# Patient Record
Sex: Female | Born: 1995 | Race: Black or African American | Hispanic: No | Marital: Single | Smoking: Never smoker
Health system: Southern US, Community
[De-identification: ages and names within clinical notes are randomized; demographics above are authoritative.]

## PROBLEM LIST (undated history)

## (undated) ENCOUNTER — Inpatient Hospital Stay (HOSPITAL_COMMUNITY): Payer: Self-pay

## (undated) DIAGNOSIS — J029 Acute pharyngitis, unspecified: Secondary | ICD-10-CM

## (undated) DIAGNOSIS — D649 Anemia, unspecified: Secondary | ICD-10-CM

## (undated) DIAGNOSIS — R51 Headache: Secondary | ICD-10-CM

## (undated) DIAGNOSIS — O24419 Gestational diabetes mellitus in pregnancy, unspecified control: Secondary | ICD-10-CM

## (undated) DIAGNOSIS — N760 Acute vaginitis: Secondary | ICD-10-CM

## (undated) DIAGNOSIS — R519 Headache, unspecified: Secondary | ICD-10-CM

## (undated) DIAGNOSIS — B3731 Acute candidiasis of vulva and vagina: Secondary | ICD-10-CM

## (undated) DIAGNOSIS — B373 Candidiasis of vulva and vagina: Secondary | ICD-10-CM

## (undated) DIAGNOSIS — A549 Gonococcal infection, unspecified: Secondary | ICD-10-CM

## (undated) DIAGNOSIS — B9689 Other specified bacterial agents as the cause of diseases classified elsewhere: Secondary | ICD-10-CM

## (undated) HISTORY — DX: Gestational diabetes mellitus in pregnancy, unspecified control: O24.419

---
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
## 2014-03-29 ENCOUNTER — Encounter (HOSPITAL_COMMUNITY): Payer: Self-pay | Admitting: Emergency Medicine

## 2014-07-27 ENCOUNTER — Emergency Department (HOSPITAL_COMMUNITY)
Admission: EM | Admit: 2014-07-27 | Discharge: 2014-07-27 | Disposition: A | Discharge status: Home / Self Care | Payer: Self-pay | Source: Home / Self Care

## 2014-07-29 ENCOUNTER — Emergency Department (HOSPITAL_COMMUNITY)
Admission: EM | Admit: 2014-07-29 | Discharge: 2014-07-29 | Disposition: A | Discharge status: Home / Self Care | Payer: Medicaid Other | Attending: Emergency Medicine | Admitting: Emergency Medicine

## 2014-07-29 ENCOUNTER — Encounter (HOSPITAL_COMMUNITY): Payer: Self-pay

## 2014-07-29 DIAGNOSIS — Z8709 Personal history of other diseases of the respiratory system: Secondary | ICD-10-CM | POA: Insufficient documentation

## 2014-07-29 DIAGNOSIS — W231XXA Caught, crushed, jammed, or pinched between stationary objects, initial encounter: Secondary | ICD-10-CM | POA: Insufficient documentation

## 2014-07-29 DIAGNOSIS — S61302A Unspecified open wound of right middle finger with damage to nail, initial encounter: Secondary | ICD-10-CM | POA: Insufficient documentation

## 2014-07-29 DIAGNOSIS — Z8619 Personal history of other infectious and parasitic diseases: Secondary | ICD-10-CM | POA: Insufficient documentation

## 2014-07-29 DIAGNOSIS — Z8742 Personal history of other diseases of the female genital tract: Secondary | ICD-10-CM | POA: Insufficient documentation

## 2014-07-29 DIAGNOSIS — S6981XA Other specified injuries of right wrist, hand and finger(s), initial encounter: Secondary | ICD-10-CM

## 2014-07-29 DIAGNOSIS — Y998 Other external cause status: Secondary | ICD-10-CM | POA: Insufficient documentation

## 2014-07-29 DIAGNOSIS — Y9289 Other specified places as the place of occurrence of the external cause: Secondary | ICD-10-CM | POA: Insufficient documentation

## 2014-07-29 DIAGNOSIS — Y9389 Activity, other specified: Secondary | ICD-10-CM | POA: Insufficient documentation

## 2014-07-29 NOTE — ED Notes (Signed)
Pt's third finger on right hand has fingernail pushed up and off. Still has false nail stuck to it. States it doesn't hurt but it sort of numb.

## 2014-07-29 NOTE — ED Notes (Signed)
Declined W/C at D/C and was escorted to lobby by RN. 

## 2014-07-29 NOTE — Discharge Instructions (Signed)
°  Nail Avulsion Injury °Nail avulsion means that you have lost the whole, or part of a nail. The nail will usually grow back in 2 to 6 months. If your injury damaged the growth center of the nail, the nail may be deformed, split, or not stuck to the nail bed. Sometimes the avulsed nail is stitched back in place. This provides temporary protection to the nail bed until the new nail grows in.  °HOME CARE INSTRUCTIONS  °· Raise (elevate) your injury as much as possible. °· Protect the injury and cover it with bandages (dressings) or splints as instructed. °· Change dressings as instructed. °SEEK MEDICAL CARE IF:  °· There is increasing pain, redness, or swelling. °· You cannot move your fingers or toes. °Document Released: 06/21/2004 Document Revised: 08/06/2011 Document Reviewed: 04/15/2009 °[employer]® Patient Information ©2015 [employer]. This information is not intended to replace advice given to you by your health care provider. Make sure you discuss any questions you have with your health care provider. ° ° °

## 2014-07-29 NOTE — ED Provider Notes (Signed)
CSN: [id number]     Arrival date & time 07/29/14  1422 History  This chart was scribed for Teressa Lower, NP with Benny Lennert, MD by Tonye Royalty, ED Scribe. This patient was seen in room TR06C/TR06C and the patient's care was started at 2:40 PM.    Chief Complaint  Patient presents with  . Finger Injury   Hand Pain  The incident occurred 5 to 7 days ago. The injury mechanism was a direct blow. The pain is present in the right fingers. The pain does not radiate. The pain is mild. The pain has been constant since the incident. Pertinent negatives include no numbness. Exacerbated by: fake nail. She has tried nothing for the symptoms.    HPI Comments: Lori Baldwin is a 19 y.o. female who presents to the Emergency Department complaining of right middle nail injury with onset 1 week ago, worsening yesterday. She has fake nails and states it started coming off 1 week ago and she pulled it off, removing with it part of her nail. She states she then jammed her finger yesterday, making it worse. She states she went to an urgent care yesterday but the wait was too long. She states her last tetanus shot was 2 years ago.  Past Medical History  Diagnosis Date  . Pharyngitis   . Gonorrhea   . BV (bacterial vaginosis)   . Yeast vaginitis   . No pertinent past medical history    Past Surgical History  Procedure Laterality Date  . No past surgeries     Family History  Problem Relation Age of Onset  . Cancer Maternal Grandmother   . Sickle cell anemia Paternal Grandmother    History  Substance Use Topics  . Smoking status: Never Smoker   . Smokeless tobacco: Not on file  . Alcohol Use: No   OB History    Gravida Para Term Preterm AB TAB SAB Ectopic Multiple Living   1              Review of Systems  Skin: Positive for wound.  Neurological: Negative for numbness.  All other systems reviewed and are negative.     Allergies  Aspirin; Ibuprofen; and Tylenol  Home Medications    Prior to Admission medications   Medication Sig Start Date End Date Taking? Authorizing Provider  traMADol (ULTRAM) 50 MG tablet Take 1 tablet (50 mg total) by mouth every 6 (six) hours as needed for pain. 08/08/12   Tatyana A Kirichenko, PA-C   BP 133/102 mmHg  Pulse 84  Temp(Src) 97.8 F (36.6 C) (Oral)  Resp 18  Ht 5' (1.524 m)  Wt 100 lb (45.36 kg)  BMI 19.53 kg/m2  SpO2 100%  LMP 07/29/2014 Physical Exam  Constitutional: She is oriented to person, place, and time. She appears well-developed and well-nourished.  HENT:  Head: Normocephalic and atraumatic.  Eyes: Conjunctivae are normal.  Neck: Normal range of motion. Neck supple.  Pulmonary/Chest: Effort normal.  Musculoskeletal: Normal range of motion.  Neurological: She is alert and oriented to person, place, and time.  Skin: Skin is warm and dry.  Pt has nail and fake nail hanging. Dried blood noted around the area. Top part of the nails is removed completely:mild nail appears the top layer of the nail removed. No definite laceration noted  Psychiatric: She has a normal mood and affect.  Nursing note and vitals reviewed.   ED Course  Procedures (including critical care time)  DIAGNOSTIC STUDIES: Oxygen Saturation  is 100% on room air, normal by my interpretation.    COORDINATION OF CARE: 2:43 PM Discussed treatment plan with patient at beside, the patient agrees with the plan and has no further questions at this time.   Labs Review Labs Reviewed - No data to display  Imaging Review No results found.   EKG Interpretation None      MDM   Final diagnoses:  Nailbed injury, right, initial encounter    Nail cut off and area was dressed. Discussed return precautions and follow up  I personally performed the services described in this documentation, which was scribed in my presence. The recorded information has been reviewed and is accurate.   Teressa LowerVrinda Alexes Lamarque, NP 07/29/14 1514  Benny LennertJoseph L Zammit,  MD 07/30/14 [phone]

## 2016-05-16 ENCOUNTER — Encounter (HOSPITAL_COMMUNITY): Payer: Self-pay | Admitting: Emergency Medicine

## 2016-05-16 ENCOUNTER — Ambulatory Visit (HOSPITAL_COMMUNITY)
Admission: EM | Admit: 2016-05-16 | Discharge: 2016-05-16 | Disposition: A | Discharge status: Home / Self Care | Payer: Medicaid Other | Attending: Emergency Medicine | Admitting: Emergency Medicine

## 2016-05-16 DIAGNOSIS — L298 Other pruritus: Secondary | ICD-10-CM | POA: Diagnosis not present

## 2016-05-16 DIAGNOSIS — Z832 Family history of diseases of the blood and blood-forming organs and certain disorders involving the immune mechanism: Secondary | ICD-10-CM | POA: Insufficient documentation

## 2016-05-16 DIAGNOSIS — Z809 Family history of malignant neoplasm, unspecified: Secondary | ICD-10-CM | POA: Insufficient documentation

## 2016-05-16 DIAGNOSIS — Z79899 Other long term (current) drug therapy: Secondary | ICD-10-CM | POA: Insufficient documentation

## 2016-05-16 DIAGNOSIS — L292 Pruritus vulvae: Secondary | ICD-10-CM | POA: Insufficient documentation

## 2016-05-16 DIAGNOSIS — N898 Other specified noninflammatory disorders of vagina: Secondary | ICD-10-CM

## 2016-05-16 LAB — POCT URINALYSIS DIP (DEVICE)
Bilirubin Urine: NEGATIVE
GLUCOSE, UA: NEGATIVE mg/dL
KETONES UR: NEGATIVE mg/dL
Nitrite: NEGATIVE
PROTEIN: 30 mg/dL — AB
UROBILINOGEN UA: 0.2 mg/dL (ref 0.0–1.0)
pH: 6 (ref 5.0–8.0)

## 2016-05-16 LAB — POCT PREGNANCY, URINE: PREG TEST UR: NEGATIVE

## 2016-05-16 MED ORDER — FLUCONAZOLE 150 MG PO TABS: 150.0000 mg | ORAL_TABLET | Freq: Once | ORAL | 0 refills | Status: AC

## 2016-05-16 NOTE — ED Triage Notes (Signed)
Vaginal irritation that started on Friday 12/15.  Denies urinary symptoms.  Denies abdominal pain or back pain

## 2016-05-16 NOTE — ED Provider Notes (Signed)
[facility]    CSN: [id number] Arrival date & time: 05/16/16  1118     History   Chief Complaint Chief Complaint  Patient presents with  . Vaginal Itching    HPI Lori Baldwin is a 20 y.o. female.   HPI  She is a 20 year old woman here for evaluation of vaginal itching. This is been going on for a few days. She denies any discharge. She states she just finished her period. She has tried patches still without improvement. No abdominal pain. No new sexual partners.  Past Medical History:  Diagnosis Date  . BV (bacterial vaginosis)   . Gonorrhea   . No pertinent past medical history   . Pharyngitis   . Yeast vaginitis     There are no active problems to display for this patient.   Past Surgical History:  Procedure Laterality Date  . NO PAST SURGERIES      OB History    Gravida Para Term Preterm AB Living   1             SAB TAB Ectopic Multiple Live Births                   Home Medications    Prior to Admission medications   Medication Sig Start Date End Date Taking? Authorizing Provider  benzocaine-resorcinol (VAGISIL) 5-2 % vaginal cream Place vaginally at bedtime.   Yes Historical Provider, MD  fluconazole (DIFLUCAN) 150 MG tablet Take 1 tablet (150 mg total) by mouth once. Take 2nd pill if symptoms not completely resolved in 3 days. 05/16/16 05/16/16  Charm RingsErin J Shayle Donahoo, MD  traMADol (ULTRAM) 50 MG tablet Take 1 tablet (50 mg total) by mouth every 6 (six) hours as needed for pain. 08/08/12   Jaynie Crumbleatyana Kirichenko, PA-C    Family History Family History  Problem Relation Age of Onset  . Cancer Maternal Grandmother   . Sickle cell anemia Paternal Grandmother     Social History Social History  Substance Use Topics  . Smoking status: Never Smoker  . Smokeless tobacco: Not on file  . Alcohol use No     Allergies   Aspirin; Ibuprofen; and Tylenol [acetaminophen]   Review of Systems Review of Systems As in history of present  illness  Physical Exam Triage Vital Signs ED Triage Vitals [05/16/16 1152]  Enc Vitals Group     BP (!) 92/53     Pulse Rate 81     Resp 16     Temp 98.2 F (36.8 C)     Temp Source Oral     SpO2 100 %     Weight      Height      Head Circumference      Peak Flow      Pain Score      Pain Loc      Pain Edu?      Excl. in GC?    No data found.   Updated Vital Signs BP (!) 92/53 (BP Location: Right Arm)   Pulse 81   Temp 98.2 F (36.8 C) (Oral)   Resp 16   LMP 05/06/2016   SpO2 100%   Visual Acuity Right Eye Distance:   Left Eye Distance:   Bilateral Distance:    Right Eye Near:   Left Eye Near:    Bilateral Near:     Physical Exam  Constitutional: She is oriented to person, place, and time. She appears well-developed and well-nourished. No  distress.  Cardiovascular: Normal rate.   Pulmonary/Chest: Effort normal.  Genitourinary: There is no rash on the right labia. There is no rash on the left labia. Cervix exhibits no motion tenderness and no discharge. No bleeding in the vagina. No foreign body in the vagina. Vaginal discharge (old blood) found.  Genitourinary Comments: Slight irritation of labia minora.  Neurological: She is alert and oriented to person, place, and time.     UC Treatments / Results  Labs (all labs ordered are listed, but only abnormal results are displayed) Labs Reviewed  POCT URINALYSIS DIP (DEVICE) - Abnormal; Notable for the following:       Result Value   Hgb urine dipstick LARGE (*)    Protein, ur 30 (*)    Leukocytes, UA TRACE (*)    All other components within normal limits  POCT PREGNANCY, URINE  CERVICOVAGINAL ANCILLARY ONLY    EKG  EKG Interpretation None       Radiology No results found.  Procedures Procedures (including critical care time)  Medications Ordered in UC Medications - No data to display   Initial Impression / Assessment and Plan / UC Course  I have reviewed the triage vital signs and the  nursing notes.  Pertinent labs & imaging results that were available during my care of the patient were reviewed by me and considered in my medical decision making (see chart for details).  Clinical Course     Treat for yeast with Diflucan. Swab sent for testing. Follow-up as needed.  Final Clinical Impressions(s) / UC Diagnoses   Final diagnoses:  Vaginal itching    New Prescriptions New Prescriptions   FLUCONAZOLE (DIFLUCAN) 150 MG TABLET    Take 1 tablet (150 mg total) by mouth once. Take 2nd pill if symptoms not completely resolved in 3 days.     Charm RingsErin J Jennifer Payes, MD 05/16/16 [phone]

## 2016-05-16 NOTE — Discharge Instructions (Signed)
It looks like a yeast infection. Take Diflucan one pill today. If still having itching in 3 days, take the second pill. The results of the testing should be back in 2-3 days. We will call you if anything is positive. Follow-up as needed.

## 2016-05-17 LAB — CERVICOVAGINAL ANCILLARY ONLY
CHLAMYDIA, DNA PROBE: NEGATIVE
NEISSERIA GONORRHEA: NEGATIVE

## 2016-05-18 LAB — CERVICOVAGINAL ANCILLARY ONLY: Wet Prep (BD Affirm): POSITIVE — AB

## 2016-06-29 ENCOUNTER — Emergency Department (HOSPITAL_COMMUNITY)
Admission: EM | Admit: 2016-06-29 | Discharge: 2016-06-29 | Disposition: A | Discharge status: Home / Self Care | Payer: Medicaid Other | Attending: Dermatology | Admitting: Dermatology

## 2016-06-29 ENCOUNTER — Encounter (HOSPITAL_COMMUNITY): Payer: Self-pay | Admitting: *Deleted

## 2016-06-29 DIAGNOSIS — Z5321 Procedure and treatment not carried out due to patient leaving prior to being seen by health care provider: Secondary | ICD-10-CM | POA: Diagnosis not present

## 2016-06-29 DIAGNOSIS — R0789 Other chest pain: Secondary | ICD-10-CM | POA: Diagnosis present
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
## 2016-07-23 ENCOUNTER — Encounter (HOSPITAL_COMMUNITY): Payer: Self-pay

## 2016-07-23 ENCOUNTER — Emergency Department (HOSPITAL_COMMUNITY)
Admission: EM | Admit: 2016-07-23 | Discharge: 2016-07-23 | Disposition: A | Discharge status: Home / Self Care | Payer: Medicaid Other | Attending: Emergency Medicine | Admitting: Emergency Medicine

## 2016-07-23 DIAGNOSIS — H6502 Acute serous otitis media, left ear: Secondary | ICD-10-CM | POA: Diagnosis not present

## 2016-07-23 DIAGNOSIS — J029 Acute pharyngitis, unspecified: Secondary | ICD-10-CM | POA: Diagnosis present

## 2016-07-23 DIAGNOSIS — R05 Cough: Secondary | ICD-10-CM | POA: Diagnosis not present

## 2016-07-23 DIAGNOSIS — R059 Cough, unspecified: Secondary | ICD-10-CM

## 2016-07-23 LAB — RAPID STREP SCREEN (MED CTR MEBANE ONLY): Streptococcus, Group A Screen (Direct): NEGATIVE

## 2016-07-23 MED ORDER — PREDNISONE 10 MG PO TABS: 20.0000 mg | ORAL_TABLET | Freq: Two times a day (BID) | ORAL | 0 refills | Status: DC

## 2016-07-23 MED ORDER — AMOXICILLIN 500 MG PO CAPS: 500.0000 mg | ORAL_CAPSULE | Freq: Three times a day (TID) | ORAL | 0 refills | Status: DC

## 2016-07-23 MED ORDER — BENZONATATE 100 MG PO CAPS: 100.0000 mg | ORAL_CAPSULE | Freq: Three times a day (TID) | ORAL | 0 refills | Status: DC

## 2016-07-23 NOTE — Discharge Instructions (Signed)
You will need to have your ear rechecked in the next week or so to be sure the infection is resolving. Return here sooner for worsening symptoms.

## 2016-07-23 NOTE — ED Triage Notes (Signed)
Pt reports sore throat, sinus congestion, generalized weakness, cough and left ear pain; onset last week. Denies vomiting/diarrhea. OTC meds not helping.

## 2016-07-23 NOTE — ED Provider Notes (Signed)
[facility] Note   CSN: [id number] Arrival date & time: 07/23/16  1912  By signing my name below, I, Lori Baldwin, attest that this documentation has been prepared under the direction and in the presence of non-physician practitioner, Kerrie Buffalo, NP. Electronically Signed: Modena Baldwin, Scribe. 07/23/2016. 8:55 PM.  History   Chief Complaint Chief Complaint  Patient presents with  . Sore Throat  . Croup   The history is provided by the patient. No language interpreter was used.  Sore Throat  This is a new problem. The current episode started more than 2 days ago. The problem occurs constantly. The problem has been gradually worsening. Pertinent negatives include no headaches. Nothing aggravates the symptoms. Nothing relieves the symptoms. She has tried acetaminophen for the symptoms. The treatment provided no relief.  Cough  This is a new problem. The current episode started more than 2 days ago. The problem has been gradually worsening. Associated symptoms include ear pain (Left) and sore throat. Pertinent negatives include no chills and no headaches. The treatment provided no relief.   HPI Comments: Lori Baldwin is a 21 y.o. female who presents to the Emergency Department complaining of intermittent moderate cough that started about a week ago. She states she has been having gradually worsening URI-like symptoms. She took Mucinex, Nyquil, and Acetaminophen, and herbal remedies without relief. She reports associated sore throat, sinus congestion, generalized weakness, left ear pain, and nausea. She denies any fever, chills, vomiting, or other complaints.   Past Medical History:  Diagnosis Date  . BV (bacterial vaginosis)   . Gonorrhea   . No pertinent past medical history   . Pharyngitis   . Yeast vaginitis     There are no active problems to display for this patient.   Past Surgical History:  Procedure Laterality Date  . NO PAST SURGERIES      OB  History    Gravida Para Term Preterm AB Living   1             SAB TAB Ectopic Multiple Live Births                   Home Medications    Prior to Admission medications   Medication Sig Start Date End Date Taking? Authorizing Provider  amoxicillin (AMOXIL) 500 MG capsule Take 1 capsule (500 mg total) by mouth 3 (three) times daily. 07/23/16   Hope Orlene Och, NP  benzocaine-resorcinol (VAGISIL) 5-2 % vaginal cream Place vaginally at bedtime.    Historical Provider, MD  benzonatate (TESSALON) 100 MG capsule Take 1 capsule (100 mg total) by mouth every 8 (eight) hours. 07/23/16   Hope Orlene Och, NP  predniSONE (DELTASONE) 10 MG tablet Take 2 tablets (20 mg total) by mouth 2 (two) times daily with a meal. 07/23/16   Hope Orlene Och, NP  traMADol (ULTRAM) 50 MG tablet Take 1 tablet (50 mg total) by mouth every 6 (six) hours as needed for pain. 08/08/12   Jaynie Crumble, PA-C    Family History Family History  Problem Relation Age of Onset  . Cancer Maternal Grandmother   . Sickle cell anemia Paternal Grandmother     Social History Social History  Substance Use Topics  . Smoking status: Never Smoker  . Smokeless tobacco: Never Used  . Alcohol use No     Allergies   Aspirin; Ibuprofen; and Tylenol [acetaminophen]   Review of Systems Review of Systems  Constitutional: Negative for chills and fever.  HENT: Positive for congestion (Sinus), ear pain (Left) and sore throat.   Respiratory: Positive for cough.   Gastrointestinal: Positive for nausea. Negative for vomiting.  Musculoskeletal: Negative for back pain.  Neurological: Positive for weakness (Generalized). Negative for dizziness and headaches.     Physical Exam Updated Vital Signs BP 104/69 (BP Location: Right Arm)   Pulse 108   Temp 98.3 F (36.8 C) (Oral)   Resp 18   LMP 06/30/2016   SpO2 100%   Physical Exam  Constitutional: She appears well-developed and well-nourished. No distress.  HENT:  Head: Normocephalic.    Right Ear: Tympanic membrane is retracted.  Left Ear: Tympanic membrane is erythematous and bulging.  Mouth/Throat: Uvula is midline. Posterior oropharyngeal erythema present. No posterior oropharyngeal edema.  Right TM is dull and retracted. Left TM is red and bulging.   Eyes: Conjunctivae are normal.  Neck: Neck supple.  Cardiovascular: Normal rate and regular rhythm.   Pulmonary/Chest: Effort normal. No respiratory distress. She has no wheezes. She has no rales.  Abdominal: Soft. There is no tenderness.  Musculoskeletal: Normal range of motion.  Neurological: She is alert.  Skin: Skin is warm and dry.  Psychiatric: She has a normal mood and affect.  Nursing note and vitals reviewed.    ED Treatments / Results  DIAGNOSTIC STUDIES: Oxygen Saturation is 100% on RA, normal by my interpretation.    COORDINATION OF CARE: 8:59 PM- Pt advised of plan for treatment and pt agrees.  Labs (all labs ordered are listed, but only abnormal results are displayed) Labs Reviewed  RAPID STREP SCREEN (NOT AT [facility])  CULTURE, GROUP A STREP [facility]   Radiology No results found.  Procedures Procedures (including critical care time)  Medications Ordered in ED Medications - No data to display   Initial Impression / Assessment and Plan / ED Course  I have reviewed the triage vital signs and the nursing notes.  Pertinent labs & imaging results that were available during my care of the patient were reviewed by me and considered in my medical decision making (see chart for details).   Pt CXR negative for acute infiltrate. Patients symptoms are consistent with URI, and otitis media. Discussed antibiotics indicated for ear infections. Pt will be discharged with symptomatic treatment.  Verbalizes understanding and is agreeable with plan. Pt is hemodynamically stable & in NAD prior to dc.  Final Clinical Impressions(s) / ED Diagnoses   Final diagnoses:  Acute serous otitis media of left ear,  recurrence not specified  Cough    New Prescriptions Discharge Medication List as of 07/23/2016  9:06 PM    START taking these medications   Details  amoxicillin (AMOXIL) 500 MG capsule Take 1 capsule (500 mg total) by mouth 3 (three) times daily., Starting Mon 07/23/2016, Print    benzonatate (TESSALON) 100 MG capsule Take 1 capsule (100 mg total) by mouth every 8 (eight) hours., Starting Mon 07/23/2016, Print    predniSONE (DELTASONE) 10 MG tablet Take 2 tablets (20 mg total) by mouth 2 (two) times daily with a meal., Starting Mon 07/23/2016, Print       I personally performed the services described in this documentation, which was scribed in my presence. The recorded information has been reviewed and is accurate.     [address]Hope FlushingM Neese, [place]NP 07/24/16 2053    Cathren LaineKevin Steinl, MD 07/26/16 1024

## 2016-07-23 NOTE — ED Notes (Signed)
Pt verbalized understanding of d/c instructions and has no further questions. Pt is stable, A&Ox4, VSS.  

## 2016-07-26 LAB — CULTURE, GROUP A STREP (THRC)

## 2016-09-27 ENCOUNTER — Encounter (HOSPITAL_COMMUNITY): Payer: Self-pay | Admitting: Emergency Medicine

## 2016-09-27 ENCOUNTER — Ambulatory Visit (HOSPITAL_COMMUNITY)
Admission: EM | Admit: 2016-09-27 | Discharge: 2016-09-27 | Disposition: A | Discharge status: Home / Self Care | Payer: Medicaid Other | Attending: Nurse Practitioner | Admitting: Nurse Practitioner

## 2016-09-27 DIAGNOSIS — B373 Candidiasis of vulva and vagina: Secondary | ICD-10-CM | POA: Insufficient documentation

## 2016-09-27 DIAGNOSIS — Z711 Person with feared health complaint in whom no diagnosis is made: Secondary | ICD-10-CM

## 2016-09-27 DIAGNOSIS — Z202 Contact with and (suspected) exposure to infections with a predominantly sexual mode of transmission: Secondary | ICD-10-CM

## 2016-09-27 DIAGNOSIS — N760 Acute vaginitis: Secondary | ICD-10-CM | POA: Diagnosis not present

## 2016-09-27 DIAGNOSIS — A549 Gonococcal infection, unspecified: Secondary | ICD-10-CM | POA: Insufficient documentation

## 2016-09-27 DIAGNOSIS — R8299 Other abnormal findings in urine: Secondary | ICD-10-CM

## 2016-09-27 DIAGNOSIS — N898 Other specified noninflammatory disorders of vagina: Secondary | ICD-10-CM

## 2016-09-27 DIAGNOSIS — Z3202 Encounter for pregnancy test, result negative: Secondary | ICD-10-CM

## 2016-09-27 LAB — POCT PREGNANCY, URINE: Preg Test, Ur: NEGATIVE

## 2016-09-27 LAB — POCT URINALYSIS DIP (DEVICE)
Bilirubin Urine: NEGATIVE
Glucose, UA: NEGATIVE mg/dL
HGB URINE DIPSTICK: NEGATIVE
KETONES UR: NEGATIVE mg/dL
LEUKOCYTES UA: NEGATIVE
Nitrite: NEGATIVE
PH: 6.5 (ref 5.0–8.0)
Protein, ur: NEGATIVE mg/dL
SPECIFIC GRAVITY, URINE: 1.025 (ref 1.005–1.030)
Urobilinogen, UA: 1 mg/dL (ref 0.0–1.0)

## 2016-09-27 MED ORDER — CEFTRIAXONE SODIUM 250 MG IJ SOLR
250.0000 mg | Freq: Once | INTRAMUSCULAR | Status: AC
  Administered 2016-09-27: 250 mg via INTRAMUSCULAR

## 2016-09-27 MED ORDER — CEFTRIAXONE SODIUM 250 MG IJ SOLR
INTRAMUSCULAR | Status: AC
  Filled 2016-09-27: qty 250

## 2016-09-27 MED ORDER — AZITHROMYCIN 250 MG PO TABS
1000.0000 mg | ORAL_TABLET | Freq: Once | ORAL | Status: AC
  Administered 2016-09-27: 1000 mg via ORAL

## 2016-09-27 MED ORDER — AZITHROMYCIN 250 MG PO TABS
ORAL_TABLET | ORAL | Status: AC
  Filled 2016-09-27: qty 4

## 2016-09-27 NOTE — ED Provider Notes (Signed)
CSN: [id number]     Arrival date & time 09/27/16  1647 History   First MD Initiated Contact with Patient 09/27/16 1721     Chief Complaint  Patient presents with  . Vaginal Discharge   (Consider location/radiation/quality/duration/timing/severity/associated sxs/prior Treatment) Subjective:  Lori Baldwin is a 21 y.o. female who complains of vaginal discharge, foul smelling urine and suprapubic pressure for 5 days. She denies any dysuria, urinary frequency, abdominal pain, nausea or vomiting. She initailly thought her symptoms was due to a yeast infection and tried a one-day course of Monistat without much relief. Patient denies any fever. Patient is in a heterosexual relationship and reports that her boyfriend has been unfaithful. She does not use any barrier contraception. Patient has a history of bacterial vaginosis and gonorrhea. Patient does not have a history of recurrent UTI.  Patient does not have a history of pyelonephritis.   The following portions of the patient's history were reviewed and updated as appropriate: allergies, current medications, past family history, past medical history, past social history, past surgical history and problem list.          Past Medical History:  Diagnosis Date  . BV (bacterial vaginosis)   . Gonorrhea   . No pertinent past medical history   . Pharyngitis   . Yeast vaginitis    Past Surgical History:  Procedure Laterality Date  . NO PAST SURGERIES     Family History  Problem Relation Age of Onset  . Cancer Maternal Grandmother   . Sickle cell anemia Paternal Grandmother    Social History  Substance Use Topics  . Smoking status: Never Smoker  . Smokeless tobacco: Never Used  . Alcohol use No   OB History    Gravida Para Term Preterm AB Living   1             SAB TAB Ectopic Multiple Live Births                 Review of Systems  Gastrointestinal: Negative for abdominal pain, nausea and vomiting.  Genitourinary: Positive  for vaginal discharge. Negative for dysuria, flank pain and pelvic pain.    Allergies  Aspirin; Ibuprofen; and Tylenol [acetaminophen]  Home Medications   Prior to Admission medications   Medication Sig Start Date End Date Taking? Authorizing Provider  amoxicillin (AMOXIL) 500 MG capsule Take 1 capsule (500 mg total) by mouth 3 (three) times daily. 07/23/16   Hope Orlene OchM Neese, NP  benzocaine-resorcinol (VAGISIL) 5-2 % vaginal cream Place vaginally at bedtime.    Historical Provider, MD  benzonatate (TESSALON) 100 MG capsule Take 1 capsule (100 mg total) by mouth every 8 (eight) hours. 07/23/16   Hope Orlene OchM Neese, NP  predniSONE (DELTASONE) 10 MG tablet Take 2 tablets (20 mg total) by mouth 2 (two) times daily with a meal. 07/23/16   Hope Orlene OchM Neese, NP  traMADol (ULTRAM) 50 MG tablet Take 1 tablet (50 mg total) by mouth every 6 (six) hours as needed for pain. 08/08/12   Jaynie Crumbleatyana Kirichenko, PA-C   Meds Ordered and Administered this Visit  Medications - No data to display  BP 107/67 (BP Location: Left Arm)   Pulse 90   Temp 98.5 F (36.9 C) (Oral)   Resp 14   SpO2 100%   Breastfeeding? No  No data found.   Physical Exam  Constitutional: She is oriented to person, place, and time. She appears well-developed and well-nourished.  Neck: Normal range of motion.  Cardiovascular: Normal  rate and regular rhythm.   Pulmonary/Chest: Effort normal and breath sounds normal.  Abdominal: Soft. Bowel sounds are normal. She exhibits no distension. There is no tenderness.  Genitourinary: Vaginal discharge found.  Genitourinary Comments: Female chaperone present. External genitalia without erythema, exudate or discharge. Vaginal vault with milky discharge. Cervix is of normal color and without any lesions. The cervical os is closed. No bleeding noted. Uterus is noted to be of normal size and nontender. No cervical motion tenderness. No palpable masses. The adnexa are without any massess or tenderness.    Musculoskeletal: Normal range of motion.  Neurological: She is alert and oriented to person, place, and time.  Skin: Skin is warm and dry.  Psychiatric: She has a normal mood and affect.    Urgent Care Course     Procedures (including critical care time)  Labs Review Labs Reviewed  POCT URINALYSIS DIP (DEVICE)  POCT PREGNANCY, URINE  CERVICOVAGINAL ANCILLARY ONLY   Laboratory:  Urine dipstick shows negative for all components.   Urine pregnancy negative  Imaging Review  No results found.   Visual Acuity Review  Right Eye Distance:   Left Eye Distance:   Bilateral Distance:    Right Eye Near:   Left Eye Near:    Bilateral Near:         MDM   1. Vaginal discharge   2. Concern about STD in female without diagnosis    Cervicovaginal screening pending. Will contact patient when results of screening is available. Will prophylactically treat patient for gonorrhea/chlamydia in the office with Azithromycin and Rocephin. Advised patient to have all partner(s) tested/treated and to abstain from unprotected sex for at least 7 days from the time that her partner(s) have been treated. Safe sex practices encouraged.   Discussed diagnosis and treatment with patient. All questions have been answered and all concerns have been addressed. The patient verbalized understanding and had no further questions    Lurline Idol, FNP 09/27/16 1756

## 2016-09-27 NOTE — ED Triage Notes (Signed)
Here for vag d/c onset yest associated w/foul odor  Took OTC Monistat w/some relief  Sexually active and does not use condoms... Concerned for STDS  A&O x4... NAD

## 2016-09-28 LAB — CERVICOVAGINAL ANCILLARY ONLY
Bacterial vaginitis: POSITIVE — AB
CANDIDA VAGINITIS: NEGATIVE
CHLAMYDIA, DNA PROBE: NEGATIVE
Neisseria Gonorrhea: POSITIVE — AB
Trichomonas: NEGATIVE

## 2017-03-11 ENCOUNTER — Encounter (HOSPITAL_COMMUNITY): Payer: Self-pay | Admitting: *Deleted

## 2017-03-11 ENCOUNTER — Ambulatory Visit (HOSPITAL_COMMUNITY)
Admission: EM | Admit: 2017-03-11 | Discharge: 2017-03-11 | Disposition: A | Discharge status: Home / Self Care | Payer: Medicaid Other | Attending: Emergency Medicine | Admitting: Emergency Medicine

## 2017-03-11 DIAGNOSIS — Z202 Contact with and (suspected) exposure to infections with a predominantly sexual mode of transmission: Secondary | ICD-10-CM | POA: Insufficient documentation

## 2017-03-11 DIAGNOSIS — Z113 Encounter for screening for infections with a predominantly sexual mode of transmission: Secondary | ICD-10-CM | POA: Diagnosis not present

## 2017-03-11 DIAGNOSIS — N898 Other specified noninflammatory disorders of vagina: Secondary | ICD-10-CM | POA: Diagnosis not present

## 2017-03-11 DIAGNOSIS — Z3202 Encounter for pregnancy test, result negative: Secondary | ICD-10-CM

## 2017-03-11 LAB — POCT PREGNANCY, URINE: Preg Test, Ur: NEGATIVE

## 2017-03-11 MED ORDER — METRONIDAZOLE 500 MG PO TABS: 500.0000 mg | ORAL_TABLET | Freq: Two times a day (BID) | ORAL | 0 refills | Status: DC

## 2017-03-11 MED ORDER — STERILE WATER FOR INJECTION IJ SOLN
INTRAMUSCULAR | Status: AC
  Filled 2017-03-11: qty 10

## 2017-03-11 MED ORDER — AZITHROMYCIN 250 MG PO TABS
ORAL_TABLET | ORAL | Status: AC
  Filled 2017-03-11: qty 4

## 2017-03-11 MED ORDER — AZITHROMYCIN 250 MG PO TABS
1000.0000 mg | ORAL_TABLET | Freq: Once | ORAL | Status: AC
  Administered 2017-03-11: 1000 mg via ORAL

## 2017-03-11 MED ORDER — CEFTRIAXONE SODIUM 250 MG IJ SOLR
INTRAMUSCULAR | Status: AC
  Filled 2017-03-11: qty 250

## 2017-03-11 MED ORDER — CEFTRIAXONE SODIUM 250 MG IJ SOLR
250.0000 mg | Freq: Once | INTRAMUSCULAR | Status: AC
  Administered 2017-03-11: 250 mg via INTRAMUSCULAR

## 2017-03-11 MED ORDER — FLUCONAZOLE 150 MG PO TABS: 150.0000 mg | ORAL_TABLET | Freq: Every day | ORAL | 0 refills | Status: DC

## 2017-03-11 NOTE — ED Notes (Signed)
Clean and dirty urine in the lab.
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
## 2017-03-11 NOTE — Discharge Instructions (Signed)
Urine pregnancy negative. As per your request, you were treated empirically for Gonorrhea, chlamydia, trichomonas, bacterial vaginitis, yeast infection. Azithromycin 1 g by mouth and Rocephin 250 mg injection given in office today. Start Flagyl and Diflucan as directed. Cytology and blood work sent, you will be contacted with any positive results that requires further treatment. Refrain from sexual activity for the next 7 days. Monitor for any worsening of symptoms, fever, abdominal pain, nausea, vomiting, to follow up for reevaluation.
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
## 2017-03-12 LAB — HSV 2 ANTIBODY, IGG

## 2017-03-12 LAB — RPR: RPR Ser Ql: NONREACTIVE

## 2017-03-12 LAB — URINE CYTOLOGY ANCILLARY ONLY
CHLAMYDIA, DNA PROBE: NEGATIVE
NEISSERIA GONORRHEA: NEGATIVE
Trichomonas: NEGATIVE

## 2017-03-12 LAB — HIV ANTIBODY (ROUTINE TESTING W REFLEX): HIV Screen 4th Generation wRfx: NONREACTIVE

## 2017-03-12 LAB — HSV 1 ANTIBODY, IGG: HSV 1 Glycoprotein G Ab, IgG: <0.91 index (ref 0.00–0.90)

## 2017-03-14 LAB — URINE CYTOLOGY ANCILLARY ONLY: Candida vaginitis: NEGATIVE

## 2017-05-04 ENCOUNTER — Ambulatory Visit (HOSPITAL_COMMUNITY)
Admission: EM | Admit: 2017-05-04 | Discharge: 2017-05-04 | Disposition: A | Discharge status: Home / Self Care | Payer: Medicaid Other | Attending: Internal Medicine | Admitting: Internal Medicine

## 2017-05-04 ENCOUNTER — Encounter (HOSPITAL_COMMUNITY): Payer: Self-pay | Admitting: *Deleted

## 2017-05-04 DIAGNOSIS — Z3202 Encounter for pregnancy test, result negative: Secondary | ICD-10-CM

## 2017-05-04 DIAGNOSIS — N898 Other specified noninflammatory disorders of vagina: Secondary | ICD-10-CM | POA: Diagnosis not present

## 2017-05-04 LAB — POCT PREGNANCY, URINE: PREG TEST UR: NEGATIVE

## 2017-05-04 MED ORDER — FLUCONAZOLE 150 MG PO TABS: 150.0000 mg | ORAL_TABLET | Freq: Every day | ORAL | 0 refills | Status: DC

## 2017-05-04 NOTE — ED Triage Notes (Addendum)
Patient reports vaginal itching. Would like to be tested for STDs. Patient was tested for HIV un consensual sex, was tested and was negative. States that she would like tested again just in case.

## 2017-05-04 NOTE — ED Provider Notes (Signed)
[facility]    CSN: [id number] Arrival date & time: 05/04/17  1934     History   Chief Complaint Chief Complaint  Patient presents with  . Vaginal Itching    HPI Lori Baldwin is a 21 y.o. female.   21 year old comes in with 1 week history of vaginal itching with metallic smelling discharge. Denies urinary symptoms such as frequency, dysuria, hematuria. Denies abdominal pain, nausea, vomiting. States cycle last week that went away in 2 days. Sexually active with 1 partner, no condom use, no other forms of birth control use.       Past Medical History:  Diagnosis Date  . BV (bacterial vaginosis)   . Gonorrhea   . No pertinent past medical history   . Pharyngitis   . Yeast vaginitis     There are no active problems to display for this patient.   Past Surgical History:  Procedure Laterality Date  . NO PAST SURGERIES      OB History    Gravida Para Term Preterm AB Living   1             SAB TAB Ectopic Multiple Live Births                   Home Medications    Prior to Admission medications   Medication Sig Start Date End Date Taking? Authorizing Provider  fluconazole (DIFLUCAN) 150 MG tablet Take 1 tablet (150 mg total) by mouth daily. Take second dose 72 hours later if symptoms still persists. 05/04/17   Belinda FisherYu, Amy V, PA-C    Family History Family History  Problem Relation Age of Onset  . Cancer Maternal Grandmother   . Sickle cell anemia Paternal Grandmother     Social History Social History   Tobacco Use  . Smoking status: Never Smoker  . Smokeless tobacco: Never Used  Substance Use Topics  . Alcohol use: No  . Drug use: Yes    Types: Marijuana    Comment: last smoked weed last wk     Allergies   Aspirin; Ibuprofen; and Tylenol [acetaminophen]   Review of Systems Review of Systems  Reason unable to perform ROS: See HPI as above.     Physical Exam Triage Vital Signs ED Triage Vitals [05/04/17 2052]  Enc Vitals Group       BP 103/63     Pulse Rate 84     Resp 16     Temp 98.4 F (36.9 C)     Temp Source Oral     SpO2 99 %     Weight      Height      Head Circumference      Peak Flow      Pain Score      Pain Loc      Pain Edu?      Excl. in GC?    No data found.  Updated Vital Signs BP 103/63 (BP Location: Left Arm)   Pulse 84   Temp 98.4 F (36.9 C) (Oral)   Resp 16   LMP 04/21/2017   SpO2 99%   Physical Exam  Constitutional: She is oriented to person, place, and time. She appears well-developed and well-nourished. No distress.  HENT:  Head: Normocephalic and atraumatic.  Eyes: Conjunctivae are normal. Pupils are equal, round, and reactive to light.  Cardiovascular: Normal rate, regular rhythm and normal heart sounds. Exam reveals no gallop and no friction rub.  No  murmur heard. Pulmonary/Chest: Effort normal and breath sounds normal. She has no wheezes. She has no rales.  Abdominal: Soft. Bowel sounds are normal. She exhibits no mass. There is no tenderness. There is no rebound, no guarding and no CVA tenderness.  Genitourinary:  Genitourinary Comments: Deferred. Patient urinated for cytology.  Neurological: She is alert and oriented to person, place, and time.  Skin: Skin is warm and dry.  Psychiatric: She has a normal mood and affect. Her behavior is normal. Judgment normal.     UC Treatments / Results  Labs (all labs ordered are listed, but only abnormal results are displayed) Labs Reviewed  HIV ANTIBODY (ROUTINE TESTING)  POCT PREGNANCY, URINE  URINE CYTOLOGY ANCILLARY ONLY    EKG  EKG Interpretation None       Radiology No results found.  Procedures Procedures (including critical care time)  Medications Ordered in UC Medications - No data to display   Initial Impression / Assessment and Plan / UC Course  I have reviewed the triage vital signs and the nursing notes.  Pertinent labs & imaging results that were available during my care of the patient  were reviewed by me and considered in my medical decision making (see chart for details).    Patient was treated empirically for yeast. Diflucan as directed. Cytology and bloodwork sent, patient will be contacted with any positive results that require additional treatment. Patient to refrain from sexual activity for the next 7 days. Return precautions given.    Final Clinical Impressions(s) / UC Diagnoses   Final diagnoses:  Vaginal discharge    ED Discharge Orders        Ordered    fluconazole (DIFLUCAN) 150 MG tablet  Daily     05/04/17 2144         Belinda FisherYu, Amy V, PA-C 05/04/17 2150

## 2017-05-04 NOTE — Discharge Instructions (Signed)
Pregnancy negative. You were treated empirically for yeast. Start diflucan as directed. Cytology and lab work sent, you will be contacted with any positive results that requires further treatment. Refrain from sexual activity for the next 7 days. Monitor for any worsening of symptoms, fever, abdominal pain, nausea, vomiting, to follow up for reevaluation.

## 2017-05-07 LAB — URINE CYTOLOGY ANCILLARY ONLY
Chlamydia: NEGATIVE
NEISSERIA GONORRHEA: NEGATIVE
Trichomonas: NEGATIVE

## 2017-05-08 LAB — HIV ANTIBODY (ROUTINE TESTING W REFLEX): HIV SCREEN 4TH GENERATION: NONREACTIVE

## 2017-05-10 LAB — URINE CYTOLOGY ANCILLARY ONLY: CANDIDA VAGINITIS: POSITIVE — AB

## 2017-05-30 ENCOUNTER — Encounter (HOSPITAL_COMMUNITY): Payer: Self-pay | Admitting: *Deleted

## 2017-05-30 ENCOUNTER — Other Ambulatory Visit: Payer: Self-pay

## 2017-05-30 ENCOUNTER — Inpatient Hospital Stay (HOSPITAL_COMMUNITY): Payer: Medicaid Other

## 2017-05-30 ENCOUNTER — Inpatient Hospital Stay (HOSPITAL_COMMUNITY)
Admission: AD | Admit: 2017-05-30 | Discharge: 2017-05-30 | Disposition: A | Discharge status: Home / Self Care | Payer: Medicaid Other | Source: Ambulatory Visit | Attending: Obstetrics & Gynecology | Admitting: Obstetrics & Gynecology

## 2017-05-30 DIAGNOSIS — Z3A00 Weeks of gestation of pregnancy not specified: Secondary | ICD-10-CM | POA: Insufficient documentation

## 2017-05-30 DIAGNOSIS — J029 Acute pharyngitis, unspecified: Secondary | ICD-10-CM | POA: Insufficient documentation

## 2017-05-30 DIAGNOSIS — O98811 Other maternal infectious and parasitic diseases complicating pregnancy, first trimester: Secondary | ICD-10-CM | POA: Diagnosis not present

## 2017-05-30 DIAGNOSIS — O26899 Other specified pregnancy related conditions, unspecified trimester: Secondary | ICD-10-CM

## 2017-05-30 DIAGNOSIS — R1030 Lower abdominal pain, unspecified: Secondary | ICD-10-CM | POA: Diagnosis present

## 2017-05-30 DIAGNOSIS — O3680X0 Pregnancy with inconclusive fetal viability, not applicable or unspecified: Secondary | ICD-10-CM | POA: Insufficient documentation

## 2017-05-30 DIAGNOSIS — O26891 Other specified pregnancy related conditions, first trimester: Secondary | ICD-10-CM | POA: Diagnosis not present

## 2017-05-30 DIAGNOSIS — R109 Unspecified abdominal pain: Secondary | ICD-10-CM | POA: Diagnosis not present

## 2017-05-30 DIAGNOSIS — N760 Acute vaginitis: Secondary | ICD-10-CM | POA: Insufficient documentation

## 2017-05-30 DIAGNOSIS — O99511 Diseases of the respiratory system complicating pregnancy, first trimester: Secondary | ICD-10-CM | POA: Diagnosis not present

## 2017-05-30 HISTORY — DX: Anemia, unspecified: D64.9

## 2017-05-30 HISTORY — DX: Headache, unspecified: R51.9

## 2017-05-30 HISTORY — DX: Headache: R51

## 2017-05-30 LAB — WET PREP, GENITAL
Clue Cells Wet Prep HPF POC: NONE SEEN
Sperm: NONE SEEN
Trich, Wet Prep: NONE SEEN
YEAST WET PREP: NONE SEEN

## 2017-05-30 LAB — URINALYSIS, ROUTINE W REFLEX MICROSCOPIC
Bilirubin Urine: NEGATIVE
Glucose, UA: NEGATIVE mg/dL
Hgb urine dipstick: NEGATIVE
Ketones, ur: 5 mg/dL — AB
Leukocytes, UA: NEGATIVE
Nitrite: NEGATIVE
Protein, ur: NEGATIVE mg/dL
Specific Gravity, Urine: 1.025 (ref 1.005–1.030)
pH: 6 (ref 5.0–8.0)

## 2017-05-30 LAB — CBC
HCT: 35 % — ABNORMAL LOW (ref 36.0–46.0)
Hemoglobin: 11.7 g/dL — ABNORMAL LOW (ref 12.0–15.0)
MCH: 27.3 pg (ref 26.0–34.0)
MCHC: 33.4 g/dL (ref 30.0–36.0)
MCV: 81.8 fL (ref 78.0–100.0)
PLATELETS: 237 10*3/uL (ref 150–400)
RBC: 4.28 MIL/uL (ref 3.87–5.11)
RDW: 13.8 % (ref 11.5–15.5)
WBC: 8.3 10*3/uL (ref 4.0–10.5)

## 2017-05-30 LAB — HCG, QUANTITATIVE, PREGNANCY: HCG, BETA CHAIN, QUANT, S: 333 m[IU]/mL — AB (ref <5)

## 2017-05-30 LAB — POCT PREGNANCY, URINE: PREG TEST UR: POSITIVE — AB

## 2017-05-30 NOTE — MAU Note (Signed)
Pt presents with c/o lower abdominal cramping that began May 27, 2017.  Reports had +UPT yesterday.  Denies VB.  Reports LMP 04/27/17.

## 2017-05-30 NOTE — Discharge Instructions (Signed)
Return to care  °· If you have heavier bleeding that soaks through more that 2 pads per hour for an hour or more °· If you bleed so much that you feel like you might pass out or you do pass out °· If you have significant abdominal pain that is not improved with Tylenol  °· If you develop a fever > 100.5 ° ° ° °First Trimester of Pregnancy °The first trimester of pregnancy is from week 1 until the end of week 13 (months 1 through 3). During this time, your baby will begin to develop inside you. At 6-8 weeks, the eyes and face are formed, and the heartbeat can be seen on ultrasound. At the end of 12 weeks, all the baby's organs are formed. Prenatal care is all the medical care you receive before the birth of your baby. Make sure you get good prenatal care and follow all of your doctor's instructions. °Follow these instructions at home: °Medicines °· Take over-the-counter and prescription medicines only as told by your doctor. Some medicines are safe and some medicines are not safe during pregnancy. °· Take a prenatal vitamin that contains at least 600 micrograms (mcg) of folic acid. °· If you have trouble pooping (constipation), take medicine that will make your stool soft (stool softener) if your doctor approves. °Eating and drinking °· Eat regular, healthy meals. °· Your doctor will tell you the amount of weight gain that is right for you. °· Avoid raw meat and uncooked cheese. °· If you feel sick to your stomach (nauseous) or throw up (vomit): °? Eat 4 or 5 small meals a day instead of 3 large meals. °? Try eating a few soda crackers. °? Drink liquids between meals instead of during meals. °· To prevent constipation: °? Eat foods that are high in fiber, like fresh fruits and vegetables, whole grains, and beans. °? Drink enough fluids to keep your pee (urine) clear or pale yellow. °Activity °· Exercise only as told by your doctor. Stop exercising if you have cramps or pain in your lower belly (abdomen) or low  back. °· Do not exercise if it is too hot, too humid, or if you are in a place of great height (high altitude). °· Try to avoid standing for long periods of time. Move your legs often if you must stand in one place for a long time. °· Avoid heavy lifting. °· Wear low-heeled shoes. Sit and stand up straight. °· You can have sex unless your doctor tells you not to. °Relieving pain and discomfort °· Wear a good support bra if your breasts are sore. °· Take warm water baths (sitz baths) to soothe pain or discomfort caused by hemorrhoids. Use hemorrhoid cream if your doctor says it is okay. °· Rest with your legs raised if you have leg cramps or low back pain. °· If you have puffy, bulging veins (varicose veins) in your legs: °? Wear support hose or compression stockings as told by your doctor. °? Raise (elevate) your feet for 15 minutes, 3-4 times a day. °? Limit salt in your food. °Prenatal care °· Schedule your prenatal visits by the twelfth week of pregnancy. °· Write down your questions. Take them to your prenatal visits. °· Keep all your prenatal visits as told by your doctor. This is important. °Safety °· Wear your seat belt at all times when driving. °· Make a list of emergency phone numbers. The list should include numbers for family, friends, the hospital, and police and   fire departments. °General instructions °· Ask your doctor for a referral to a local prenatal class. Begin classes no later than at the start of month 6 of your pregnancy. °· Ask for help if you need counseling or if you need help with nutrition. Your doctor can give you advice or tell you where to go for help. °· Do not use hot tubs, steam rooms, or saunas. °· Do not douche or use tampons or scented sanitary pads. °· Do not cross your legs for long periods of time. °· Avoid all herbs and alcohol. Avoid drugs that are not approved by your doctor. °· Do not use any tobacco products, including cigarettes, chewing tobacco, and electronic  cigarettes. If you need help quitting, ask your doctor. You may get counseling or other support to help you quit. °· Avoid cat litter boxes and soil used by cats. These carry germs that can cause birth defects in the baby and can cause a loss of your baby (miscarriage) or stillbirth. °· Visit your dentist. At home, brush your teeth with a soft toothbrush. Be gentle when you floss. °Contact a doctor if: °· You are dizzy. °· You have mild cramps or pressure in your lower belly. °· You have a nagging pain in your belly area. °· You continue to feel sick to your stomach, you throw up, or you have watery poop (diarrhea). °· You have a bad smelling fluid coming from your vagina. °· You have pain when you pee (urinate). °· You have increased puffiness (swelling) in your face, hands, legs, or ankles. °Get help right away if: °· You have a fever. °· You are leaking fluid from your vagina. °· You have spotting or bleeding from your vagina. °· You have very bad belly cramping or pain. °· You gain or lose weight rapidly. °· You throw up blood. It may look like coffee grounds. °· You are around people who have German measles, fifth disease, or chickenpox. °· You have a very bad headache. °· You have shortness of breath. °· You have any kind of trauma, such as from a fall or a car accident. °Summary °· The first trimester of pregnancy is from week 1 until the end of week 13 (months 1 through 3). °· To take care of yourself and your unborn baby, you will need to eat healthy meals, take medicines only if your doctor tells you to do so, and do activities that are safe for you and your baby. °· Keep all follow-up visits as told by your doctor. This is important as your doctor will have to ensure that your baby is healthy and growing well. °This information is not intended to replace advice given to you by your health care provider. Make sure you discuss any questions you have with your health care provider. °Document Released:  10/31/2007 Document Revised: 05/22/2016 Document Reviewed: 05/22/2016 °Elsevier Interactive Patient Education © 2017 [employer] ° °

## 2017-05-30 NOTE — MAU Note (Signed)
Cramping last few days, period never came on, +HPT x2.

## 2017-05-30 NOTE — MAU Provider Note (Signed)
History     CSN: [id number]  Arrival date and time: 05/30/17 1216   First Provider Initiated Contact with Patient 05/30/17 1413      Chief Complaint  Patient presents with  . Abdominal Pain   HPI  Lori Baldwin is a 22 y.o. G2P1001 at [redacted]w[redacted]d by LMP who presents with abdominal cramping. Lower abdominal cramping since New Years Eve. Feels like menstrual cramps. Rates pain 6/10. Has not treated pain. Denies n/v/d, constipation, dysuria, vaginal discharge, or vaginal bleeding.   OB History    Gravida Para Term Preterm AB Living   2 1 1     1    SAB TAB Ectopic Multiple Live Births           1      Past Medical History:  Diagnosis Date  . Anemia   . BV (bacterial vaginosis)   . Gonorrhea   . Headache   . Pharyngitis   . Yeast vaginitis     Past Surgical History:  Procedure Laterality Date  . CESAREAN SECTION      Family History  Problem Relation Age of Onset  . Cancer Maternal Grandmother        breast  . Sickle cell anemia Paternal Grandmother   . Hypertension Mother   . Diabetes Maternal Grandfather     Social History   Tobacco Use  . Smoking status: Never Smoker  . Smokeless tobacco: Never Used  Substance Use Topics  . Alcohol use: No  . Drug use: Yes    Types: Marijuana    Comment: last was early Dec    Allergies:  Allergies  Allergen Reactions  . Aspirin Itching  . Ibuprofen Itching  . Tylenol [Acetaminophen] Itching    Medications Prior to Admission  Medication Sig Dispense Refill Last Dose  . fluconazole (DIFLUCAN) 150 MG tablet Take 1 tablet (150 mg total) by mouth daily. Take second dose 72 hours later if symptoms still persists. 2 tablet 0     Review of Systems  Constitutional: Negative.   Gastrointestinal: Positive for abdominal pain. Negative for diarrhea, nausea and vomiting.  Genitourinary: Negative.    Physical Exam   Blood pressure 102/64, pulse 82, temperature 98.8 F (37.1 C), temperature source Oral, resp. rate 20, height  5' (1.524 m), weight 100 lb 12 oz (45.7 kg), last menstrual period 04/27/2017, unknown if currently breastfeeding.  Physical Exam  Nursing note and vitals reviewed. Constitutional: She is oriented to person, place, and time. She appears well-developed and well-nourished. No distress.  HENT:  Head: Normocephalic and atraumatic.  Eyes: Conjunctivae are normal. Right eye exhibits no discharge. Left eye exhibits no discharge. No scleral icterus.  Neck: Normal range of motion.  Respiratory: Effort normal. No respiratory distress.  GI: Soft. She exhibits no distension. There is no tenderness. There is no rebound and no guarding.  Genitourinary: Uterus normal. Cervix exhibits no motion tenderness. Right adnexum displays no mass and no tenderness. Left adnexum displays no mass and no tenderness. No bleeding in the vagina.  Genitourinary Comments: Cervix closed  Neurological: She is alert and oriented to person, place, and time.  Skin: Skin is warm and dry. She is not diaphoretic.  Psychiatric: She has a normal mood and affect. Her behavior is normal. Judgment and thought content normal.    MAU Course  Procedures Results for orders placed or performed during the hospital encounter of 05/30/17 (from the past 24 hour(s))  Urinalysis, Routine w reflex microscopic     Status:  Abnormal   Collection Time: 05/30/17  1:00 PM  Result Value Ref Range   Color, Urine YELLOW YELLOW   APPearance CLEAR CLEAR   Specific Gravity, Urine 1.025 1.005 - 1.030   pH 6.0 5.0 - 8.0   Glucose, UA NEGATIVE NEGATIVE mg/dL   Hgb urine dipstick NEGATIVE NEGATIVE   Bilirubin Urine NEGATIVE NEGATIVE   Ketones, ur 5 (A) NEGATIVE mg/dL   Protein, ur NEGATIVE NEGATIVE mg/dL   Nitrite NEGATIVE NEGATIVE   Leukocytes, UA NEGATIVE NEGATIVE  Pregnancy, urine POC     Status: Abnormal   Collection Time: 05/30/17  1:20 PM  Result Value Ref Range   Preg Test, Ur POSITIVE (A) NEGATIVE  CBC     Status: Abnormal   Collection Time:  05/30/17  2:13 PM  Result Value Ref Range   WBC 8.3 4.0 - 10.5 K/uL   RBC 4.28 3.87 - 5.11 MIL/uL   Hemoglobin 11.7 (L) 12.0 - 15.0 g/dL   HCT 16.135.0 (L) 09.636.0 - 04.546.0 %   MCV 81.8 78.0 - 100.0 fL   MCH 27.3 26.0 - 34.0 pg   MCHC 33.4 30.0 - 36.0 g/dL   RDW 40.913.8 81.111.5 - 91.415.5 %   Platelets 237 150 - 400 K/uL  hCG, quantitative, pregnancy     Status: Abnormal   Collection Time: 05/30/17  2:13 PM  Result Value Ref Range   hCG, Beta Chain, Quant, S 333 (H) <5 mIU/mL  Wet prep, genital     Status: Abnormal   Collection Time: 05/30/17  2:31 PM  Result Value Ref Range   Yeast Wet Prep HPF POC NONE SEEN NONE SEEN   Trich, Wet Prep NONE SEEN NONE SEEN   Clue Cells Wet Prep HPF POC NONE SEEN NONE SEEN   WBC, Wet Prep HPF POC FEW (A) NONE SEEN   Sperm NONE SEEN    [place] Ob Comp Less 14 Wks  Result Date: 05/30/2017 CLINICAL DATA:  Pregnant patient with cramping. EXAM: OBSTETRIC <14 WK US AND TRANSVAGINAL OB US TECHNIQUE: Both transabdominal and transvaginal ultrasound examinations were performed for complete evaluation of the gestation as well as the maternal uterus, adnexal regions, and pelvic cul-de-sac. Transvaginal technique was performed to assess early pregnancy. COMPARISON:  Pelvic ultrasound 08/17/2011 FINDINGS: Intrauterine gestational sac: None Yolk sac:  Not Visualized. Embryo:  Not Visualized. Cardiac Activity: Not Visualized. Maternal uterus/adnexae: Normal right and left ovaries. No free fluid in the pelvis. Small amount of debris within the endometrial canal. IMPRESSION: No intrauterine gestation identified. In the setting of positive pregnancy test and no definite intrauterine pregnancy, this reflects a pregnancy of unknown location. Differential considerations include early normal IUP, abnormal IUP, or nonvisualized ectopic pregnancy. Differentiation is achieved with serial beta HCG supplemented by repeat sonography as clinically warranted. Small amount of debris in the endometrial canal.  Recommend attention on follow-up exam. Electronically Signed   By: Annia Beltrew  Davis M.D.   On: 05/30/2017 15:22   [place] Ob Transvaginal  Result Date: 05/30/2017 CLINICAL DATA:  Pregnant patient with cramping. EXAM: OBSTETRIC <14 WK US AND TRANSVAGINAL OB US TECHNIQUE: Both transabdominal and transvaginal ultrasound examinations were performed for complete evaluation of the gestation as well as the maternal uterus, adnexal regions, and pelvic cul-de-sac. Transvaginal technique was performed to assess early pregnancy. COMPARISON:  Pelvic ultrasound 08/17/2011 FINDINGS: Intrauterine gestational sac: None Yolk sac:  Not Visualized. Embryo:  Not Visualized. Cardiac Activity: Not Visualized. Maternal uterus/adnexae: Normal right and left ovaries. No free fluid in the pelvis. Small  amount of debris within the endometrial canal. IMPRESSION: No intrauterine gestation identified. In the setting of positive pregnancy test and no definite intrauterine pregnancy, this reflects a pregnancy of unknown location. Differential considerations include early normal IUP, abnormal IUP, or nonvisualized ectopic pregnancy. Differentiation is achieved with serial beta HCG supplemented by repeat sonography as clinically warranted. Small amount of debris in the endometrial canal. Recommend attention on follow-up exam. Electronically Signed   By: Annia Belt M.D.   On: 05/30/2017 15:22     MDM +UPT UA, wet prep, GC/chlamydia, CBC, ABO/Rh, quant hCG, HIV, and [place] today to rule out ectopic pregnancy A positive HCG 333 --- ultrasound shows no IUP or adnexal mass This abdominal pain could represent a normal pregnancy, spontaneous abortion, or even an ectopic pregnancy which can be life-threatening. Cultures were obtained to rule out pelvic infection.    Assessment and Plan  A: 1. Pregnancy of unknown anatomic location   2. Abdominal pain affecting pregnancy    P: Discharge home Discussed reasons tor return including ectopic  precautions Return to MAU on Sunday for repeat BHCG GC/CT pending   Judeth Horn 05/30/2017, 2:13 PM

## 2017-05-31 ENCOUNTER — Encounter (HOSPITAL_COMMUNITY): Payer: Self-pay

## 2017-05-31 ENCOUNTER — Emergency Department (HOSPITAL_COMMUNITY): Payer: Medicaid Other

## 2017-05-31 ENCOUNTER — Observation Stay (HOSPITAL_COMMUNITY)
Admission: EM | Admit: 2017-05-31 | Discharge: 2017-06-01 | Disposition: A | Discharge status: Home / Self Care | Payer: Medicaid Other | Attending: Emergency Medicine | Admitting: Emergency Medicine

## 2017-05-31 DIAGNOSIS — R109 Unspecified abdominal pain: Secondary | ICD-10-CM

## 2017-05-31 DIAGNOSIS — O26891 Other specified pregnancy related conditions, first trimester: Secondary | ICD-10-CM

## 2017-05-31 DIAGNOSIS — O9989 Other specified diseases and conditions complicating pregnancy, childbirth and the puerperium: Secondary | ICD-10-CM | POA: Diagnosis present

## 2017-05-31 DIAGNOSIS — O26899 Other specified pregnancy related conditions, unspecified trimester: Secondary | ICD-10-CM

## 2017-05-31 DIAGNOSIS — O209 Hemorrhage in early pregnancy, unspecified: Secondary | ICD-10-CM

## 2017-05-31 DIAGNOSIS — Z3A01 Less than 8 weeks gestation of pregnancy: Secondary | ICD-10-CM | POA: Insufficient documentation

## 2017-05-31 DIAGNOSIS — R102 Pelvic and perineal pain: Secondary | ICD-10-CM

## 2017-05-31 DIAGNOSIS — R1031 Right lower quadrant pain: Secondary | ICD-10-CM | POA: Insufficient documentation

## 2017-05-31 LAB — TYPE AND SCREEN
ABO/RH(D): A POS
ANTIBODY SCREEN: NEGATIVE

## 2017-05-31 LAB — LIPASE, BLOOD: LIPASE: 28 U/L (ref 11–51)

## 2017-05-31 LAB — CBC
HCT: 35.7 % — ABNORMAL LOW (ref 36.0–46.0)
Hemoglobin: 11.8 g/dL — ABNORMAL LOW (ref 12.0–15.0)
MCH: 27.2 pg (ref 26.0–34.0)
MCHC: 33.1 g/dL (ref 30.0–36.0)
MCV: 82.3 fL (ref 78.0–100.0)
PLATELETS: 235 10*3/uL (ref 150–400)
RBC: 4.34 MIL/uL (ref 3.87–5.11)
RDW: 13.6 % (ref 11.5–15.5)
WBC: 10.7 10*3/uL — ABNORMAL HIGH (ref 4.0–10.5)

## 2017-05-31 LAB — ABO/RH: ABO/RH(D): A POS

## 2017-05-31 LAB — COMPREHENSIVE METABOLIC PANEL
ALK PHOS: 49 U/L (ref 38–126)
ALT: 15 U/L (ref 14–54)
AST: 21 U/L (ref 15–41)
Albumin: 4.3 g/dL (ref 3.5–5.0)
Anion gap: 9 (ref 5–15)
BUN: 9 mg/dL (ref 6–20)
CHLORIDE: 104 mmol/L (ref 101–111)
CO2: 22 mmol/L (ref 22–32)
CREATININE: 0.73 mg/dL (ref 0.44–1.00)
Calcium: 9.1 mg/dL (ref 8.9–10.3)
GFR calc non Af Amer: >60 mL/min (ref >60)
GLUCOSE: 106 mg/dL — AB (ref 65–99)
Potassium: 3.4 mmol/L — ABNORMAL LOW (ref 3.5–5.1)
SODIUM: 135 mmol/L (ref 135–145)
Total Bilirubin: 0.8 mg/dL (ref 0.3–1.2)
Total Protein: 7.9 g/dL (ref 6.5–8.1)

## 2017-05-31 LAB — URINALYSIS, ROUTINE W REFLEX MICROSCOPIC
Bilirubin Urine: NEGATIVE
Glucose, UA: NEGATIVE mg/dL
KETONES UR: NEGATIVE mg/dL
Leukocytes, UA: NEGATIVE
Nitrite: NEGATIVE
PH: 5 (ref 5.0–8.0)
PROTEIN: 30 mg/dL — AB
Specific Gravity, Urine: 1.028 (ref 1.005–1.030)

## 2017-05-31 LAB — I-STAT BETA HCG BLOOD, ED (MC, WL, AP ONLY): I-stat hCG, quantitative: 477 m[IU]/mL — ABNORMAL HIGH (ref <5)

## 2017-05-31 LAB — GC/CHLAMYDIA PROBE AMP (~~LOC~~) NOT AT ARMC
Chlamydia: NEGATIVE
Neisseria Gonorrhea: NEGATIVE

## 2017-05-31 LAB — HIV ANTIBODY (ROUTINE TESTING W REFLEX): HIV Screen 4th Generation wRfx: NONREACTIVE

## 2017-05-31 LAB — HCG, QUANTITATIVE, PREGNANCY: HCG, BETA CHAIN, QUANT, S: 544 m[IU]/mL — AB (ref <5)

## 2017-05-31 MED ORDER — SODIUM CHLORIDE 0.9 % IV BOLUS (SEPSIS)
1000.0000 mL | Freq: Once | INTRAVENOUS | Status: AC
  Administered 2017-05-31: 1000 mL via INTRAVENOUS

## 2017-05-31 MED ORDER — MORPHINE SULFATE (PF) 4 MG/ML IV SOLN: 4.0000 mg | Freq: Once | INTRAVENOUS | Status: DC

## 2017-05-31 MED ORDER — ONDANSETRON HCL 4 MG/2ML IJ SOLN
INTRAMUSCULAR | Status: AC
  Administered 2017-05-31: 4 mg
  Filled 2017-05-31: qty 2

## 2017-05-31 MED ORDER — ONDANSETRON HCL 4 MG/2ML IJ SOLN
4.0000 mg | Freq: Once | INTRAMUSCULAR | Status: AC
Start: 2017-05-31 — End: 2017-05-31
  Administered 2017-05-31: 4 mg via INTRAVENOUS
  Filled 2017-05-31: qty 2

## 2017-05-31 MED ORDER — HYDROMORPHONE HCL 1 MG/ML IJ SOLN
1.0000 mg | Freq: Once | INTRAMUSCULAR | Status: AC
  Administered 2017-05-31: 1 mg via INTRAVENOUS
  Filled 2017-05-31: qty 1

## 2017-05-31 NOTE — ED Provider Notes (Signed)
[facility]   CSN: [id number] Arrival date & time: 05/31/17  1904     History   Chief Complaint Chief Complaint  Patient presents with  . Abdominal Pain  . Flank Pain    HPI Lori Baldwin is a 22 y.o. female G2P1, previous c-section and currently [redacted] weeks pregnant presents for evaluation of sudden onset constant 10/10 RLQ abdominal pain that radiates up to her right rib cage associated with chills, nausea, NBNB emesis onset 30 min PTA. Tried to eat a hot dog when it first started thinking she was hungry but this did not help. No other interventions PTA. No alleviating factors. Worse with palpation and movement. Reports heavy vaginal bloody discharge 30 min before my exam.   Denies fevers, chest pain, shortness of breath, dysuria.   HPI  Past Medical History:  Diagnosis Date  . Anemia   . BV (bacterial vaginosis)   . Gonorrhea   . Headache   . Pharyngitis   . Yeast vaginitis     There are no active problems to display for this patient.   Past Surgical History:  Procedure Laterality Date  . CESAREAN SECTION      OB History    Gravida Para Term Preterm AB Living   2 1 1     1    SAB TAB Ectopic Multiple Live Births           1       Home Medications    Prior to Admission medications   Medication Sig Start Date End Date Taking? Authorizing Provider  fluconazole (DIFLUCAN) 150 MG tablet Take 1 tablet (150 mg total) by mouth daily. Take second dose 72 hours later if symptoms still persists. Patient not taking: Reported on 05/31/2017 05/04/17   Lurline IdolYu, Amy V, PA-C    Family History Family History  Problem Relation Age of Onset  . Cancer Maternal Grandmother        breast  . Sickle cell anemia Paternal Grandmother   . Hypertension Mother   . Diabetes Maternal Grandfather     Social History Social History   Tobacco Use  . Smoking status: Never Smoker  . Smokeless tobacco: Never Used  Substance Use Topics    . Alcohol use: No  . Drug use: Yes    Types: Marijuana    Comment: last was early Dec     Allergies   Aspirin and Tylenol [acetaminophen]   Review of Systems Review of Systems  Constitutional: Positive for chills.  Gastrointestinal: Positive for abdominal pain, nausea and vomiting.  Genitourinary: Positive for pelvic pain and vaginal bleeding.  All other systems reviewed and are negative.    Physical Exam Updated Vital Signs BP 101/60   Pulse 79   Temp 98.6 F (37 C) (Oral)   Resp 14   Ht 5' (1.524 m)   Wt 45.4 kg (100 lb)   LMP 04/27/2017   SpO2 99%   BMI 19.53 kg/m   Physical Exam  Constitutional: She is oriented to person, place, and time. She appears well-developed and well-nourished. No distress.  NAD.  HENT:  Head: Normocephalic and atraumatic.  Right Ear: External ear normal.  Left Ear: External ear normal.  Nose: Nose normal.  Eyes: Conjunctivae and EOM are normal. No scleral icterus.  Neck: Normal range of motion. Neck supple.  Cardiovascular: Normal rate, regular rhythm and normal heart sounds.  No murmur heard. Pulmonary/Chest: Effort normal and breath sounds normal. She has  no wheezes.  Abdominal: Normal appearance and bowel sounds are normal. There is tenderness in the right lower quadrant and suprapubic area.  No G/R/R. No CVAT.   Genitourinary:  Genitourinary Comments: Deferred   Musculoskeletal: Normal range of motion. She exhibits no deformity.  Neurological: She is alert and oriented to person, place, and time.  Skin: Skin is warm and dry. Capillary refill takes less than 2 seconds.  Psychiatric: She has a normal mood and affect. Her behavior is normal. Judgment and thought content normal.  Nursing note and vitals reviewed.    ED Treatments / Results  Labs (all labs ordered are listed, but only abnormal results are displayed) Labs Reviewed  COMPREHENSIVE METABOLIC PANEL - Abnormal; Notable for the following components:      Result  Value   Potassium 3.4 (*)    Glucose, Bld 106 (*)    All other components within normal limits  CBC - Abnormal; Notable for the following components:   WBC 10.7 (*)    Hemoglobin 11.8 (*)    HCT 35.7 (*)    All other components within normal limits  URINALYSIS, ROUTINE W REFLEX MICROSCOPIC - Abnormal; Notable for the following components:   APPearance CLOUDY (*)    Hgb urine dipstick SMALL (*)    Protein, ur 30 (*)    Bacteria, UA RARE (*)    Squamous Epithelial / LPF 6-30 (*)    All other components within normal limits  HCG, QUANTITATIVE, PREGNANCY - Abnormal; Notable for the following components:   hCG, Beta Chain, Quant, S 544 (*)    All other components within normal limits  I-STAT BETA HCG BLOOD, ED ([facility], [facility], [facility] ONLY) - Abnormal; Notable for the following components:   I-stat hCG, quantitative 477.0 (*)    All other components within normal limits  LIPASE, BLOOD  TYPE AND SCREEN  ABO/RH    EKG  EKG Interpretation None       Radiology US Ob Comp Less 14 Wks  Result Date: 05/30/2017 CLINICAL DATA:  Pregnant patient with cramping. EXAM: OBSTETRIC <14 WK [place] AND TRANSVAGINAL OB US TECHNIQUE: Both transabdominal and transvaginal ultrasound examinations were performed for complete evaluation of the gestation as well as the maternal uterus, adnexal regions, and pelvic cul-de-sac. Transvaginal technique was performed to assess early pregnancy. COMPARISON:  Pelvic ultrasound 08/17/2011 FINDINGS: Intrauterine gestational sac: None Yolk sac:  Not Visualized. Embryo:  Not Visualized. Cardiac Activity: Not Visualized. Maternal uterus/adnexae: Normal right and left ovaries. No free fluid in the pelvis. Small amount of debris within the endometrial canal. IMPRESSION: No intrauterine gestation identified. In the setting of positive pregnancy test and no definite intrauterine pregnancy, this reflects a pregnancy of unknown location. Differential considerations include early normal IUP,  abnormal IUP, or nonvisualized ectopic pregnancy. Differentiation is achieved with serial beta HCG supplemented by repeat sonography as clinically warranted. Small amount of debris in the endometrial canal. Recommend attention on follow-up exam. Electronically Signed   By: Annia Belt M.D.   On: 05/30/2017 15:22   US Ob Transvaginal  Result Date: 05/30/2017 CLINICAL DATA:  Pregnant patient with cramping. EXAM: OBSTETRIC <14 WK [place] AND TRANSVAGINAL OB US TECHNIQUE: Both transabdominal and transvaginal ultrasound examinations were performed for complete evaluation of the gestation as well as the maternal uterus, adnexal regions, and pelvic cul-de-sac. Transvaginal technique was performed to assess early pregnancy. COMPARISON:  Pelvic ultrasound 08/17/2011 FINDINGS: Intrauterine gestational sac: None Yolk sac:  Not Visualized. Embryo:  Not Visualized. Cardiac Activity: Not Visualized.  Maternal uterus/adnexae: Normal right and left ovaries. No free fluid in the pelvis. Small amount of debris within the endometrial canal. IMPRESSION: No intrauterine gestation identified. In the setting of positive pregnancy test and no definite intrauterine pregnancy, this reflects a pregnancy of unknown location. Differential considerations include early normal IUP, abnormal IUP, or nonvisualized ectopic pregnancy. Differentiation is achieved with serial beta HCG supplemented by repeat sonography as clinically warranted. Small amount of debris in the endometrial canal. Recommend attention on follow-up exam. Electronically Signed   By: Annia Belt M.D.   On: 05/30/2017 15:22   US Pelvic Doppler (torsion R/o Or Mass Arterial Flow)  Result Date: 05/31/2017 CLINICAL DATA:  Abdominal pain with positive pregnancy test. Rising beta HCG to 544 today versus 333 yesterday. EXAM: OBSTETRIC <14 WK [place] AND TRANSVAGINAL OB US DOPPLER ULTRASOUND OF OVARIES TECHNIQUE: Both transabdominal and transvaginal ultrasound examinations were performed for  complete evaluation of the gestation as well as the maternal uterus, adnexal regions, and pelvic cul-de-sac. Transvaginal technique was performed to assess early pregnancy. Color and duplex Doppler ultrasound was utilized to evaluate blood flow to the ovaries. COMPARISON:  Pelvic ultrasound from 1 day prior. FINDINGS: A moderate amount of hypoechoic complex fluid is now seen within the cul-de-sac and pelvis, new since yesterday's exam and concerning for hemorrhage. An intrauterine pregnancy is not identified. Likewise an ectopic pregnancy is not definitively identified but must be assumed given the presence of complex free fluid within the pelvis. Patient reportedly was in severe pain per discussion with the ultrasound technologist. Intrauterine gestational sac: None Yolk sac:  Not Visualized. Embryo:  Not Visualized. Cardiac Activity: Not Visualized. Heart Rate: Not applicable Subchorionic hemorrhage:  None visualized. Maternal uterus/adnexae: Retroverted uterus. Right ovary visualized measuring 3.2 x 1.9 x 2 cm. Left ovary visualized measuring 3.4 x 1.7 x 1.7 cm. Corpus luteal cyst is suggested of the left ovary measuring 1.4 x 1.2 x 1.4 cm. Pulsed Doppler evaluation of both ovaries demonstrates normal appearing low-resistance arterial and venous waveforms. IMPRESSION: 1. New moderate complex free fluid within the pelvis concerning for hemorrhage. Given positive pregnancy test and lack of intrauterine gestation, ruptured ectopic is the leading consideration for the hypoechoic complex fluid. Critical Value/emergent results were called by telephone at the time of interpretation on 05/31/2017 at 10:06 pm to PA Sharen Heck who verbally acknowledged these results. 2. No ovarian torsion. Electronically Signed   By: Tollie Eth M.D.   On: 05/31/2017 22:07   US Ob Less Than 14 Weeks With Ob Transvaginal  Result Date: 05/31/2017 CLINICAL DATA:  Abdominal pain with positive pregnancy test. Rising beta HCG to 544 today  versus 333 yesterday. EXAM: OBSTETRIC <14 WK [place] AND TRANSVAGINAL OB US DOPPLER ULTRASOUND OF OVARIES TECHNIQUE: Both transabdominal and transvaginal ultrasound examinations were performed for complete evaluation of the gestation as well as the maternal uterus, adnexal regions, and pelvic cul-de-sac. Transvaginal technique was performed to assess early pregnancy. Color and duplex Doppler ultrasound was utilized to evaluate blood flow to the ovaries. COMPARISON:  Pelvic ultrasound from 1 day prior. FINDINGS: A moderate amount of hypoechoic complex fluid is now seen within the cul-de-sac and pelvis, new since yesterday's exam and concerning for hemorrhage. An intrauterine pregnancy is not identified. Likewise an ectopic pregnancy is not definitively identified but must be assumed given the presence of complex free fluid within the pelvis. Patient reportedly was in severe pain per discussion with the ultrasound technologist. Intrauterine gestational sac: None Yolk sac:  Not Visualized. Embryo:  Not Visualized. Cardiac Activity: Not Visualized. Heart Rate: Not applicable Subchorionic hemorrhage:  None visualized. Maternal uterus/adnexae: Retroverted uterus. Right ovary visualized measuring 3.2 x 1.9 x 2 cm. Left ovary visualized measuring 3.4 x 1.7 x 1.7 cm. Corpus luteal cyst is suggested of the left ovary measuring 1.4 x 1.2 x 1.4 cm. Pulsed Doppler evaluation of both ovaries demonstrates normal appearing low-resistance arterial and venous waveforms. IMPRESSION: 1. New moderate complex free fluid within the pelvis concerning for hemorrhage. Given positive pregnancy test and lack of intrauterine gestation, ruptured ectopic is the leading consideration for the hypoechoic complex fluid. Critical Value/emergent results were called by telephone at the time of interpretation on 05/31/2017 at 10:06 pm to PA Sharen Heck who verbally acknowledged these results. 2. No ovarian torsion. Electronically Signed   By: Tollie Eth  M.D.   On: 05/31/2017 22:07    Procedures Procedures (including critical care time)  Medications Ordered in ED Medications  ondansetron (ZOFRAN) 4 MG/2ML injection (4 mg  Given 05/31/17 2148)  ondansetron (ZOFRAN) injection 4 mg (4 mg Intravenous Given 05/31/17 2221)  sodium chloride 0.9 % bolus 1,000 mL (1,000 mLs Intravenous New Bag/Given 05/31/17 2205)  HYDROmorphone (DILAUDID) injection 1 mg (1 mg Intravenous Given 05/31/17 2221)     Initial Impression / Assessment and Plan / ED Course  I have reviewed the triage vital signs and the nursing notes.  Pertinent labs & imaging results that were available during my care of the patient were reviewed by me and considered in my medical decision making (see chart for details).  Clinical Course as of May 31 2312  Caleen Essex May 31, 2017  2146 HCG, [place], [place], [place]: (!) 544 [CG]  2147 WBC: (!) 10.7 [CG]  2147 Hemoglobin: (!) 11.8 [CG]  2147 Appearance: (!) CLOUDY [CG]  2147 Hgb urine dipstick: (!) SMALL [CG]  2147 Bacteria, UA: (!) RARE [CG]  2157 Radiology called noted complex fluid in cul de sac suggesting ruptured ectopic   [CG]  2218 IMPRESSION: 1. New moderate complex free fluid within the pelvis concerning for hemorrhage. Given positive pregnancy test and lack of intrauterine gestation, ruptured ectopic is the leading consideration for the hypoechoic complex fluid. Critical Value/emergent results were called by telephone at the time of interpretation on 05/31/2017 at 10:06 pm to PA Sharen Heck who verbally acknowledged these results. 2. No ovarian torsion. US PELVIC DOPPLER (TORSION R/O OR MASS ARTERIAL FLOW) [CG]  2242 Spoke to Dr. Despina Hidden who looked at patient's chart and ultrasound. Higher suspicion for hemorrhagic cyst than ruptured ectopic given rising hcg and no IUP noted on ultrasound yesterday. No need for pelvic ultrasound. Recommending [facility].   [CG]    Clinical Course User Index [CG] Liberty Handy,  PA-C   22 yo G2P1 with h/o c-section and currently [redacted] weeks pregnant presents with RLQ pain, nausea, vomiting, chills, vaginal bleeding.   Exam remarkable for focal RLQ tenderness w/o peritonitis. HD stable. Non toxic.   U/S shows possible ruptured ectopic. Discussed with OBGYN (Dr. Despina Hidden) who recommend transfer to [facility], he suspect hemorrhagic cyst over ruptured ectopic. Updated patient who is agreeable with transfer. VS WNL and stable before trasnfer.   Final Clinical Impressions(s) / ED Diagnoses   Final diagnoses:  Pelvic pain    ED Discharge Orders    None       Jerrell Mylar 05/31/17 2314    Mancel Bale, MD 06/01/17 [phone]

## 2017-05-31 NOTE — ED Triage Notes (Signed)
Pt states that she is four weeks pregnant, began to have severe abd pain in RLQ along with vomiting today, denies fevers, pt tearful in triage.

## 2017-05-31 NOTE — ED Notes (Signed)
Pt walked out

## 2017-05-31 NOTE — ED Notes (Signed)
PT is back to the room

## 2017-05-31 NOTE — ED Notes (Signed)
Patient transported to Ultrasound 

## 2017-05-31 NOTE — ED Provider Notes (Signed)
Patient placed in Quick Look pathway, seen and evaluated for chief complaint of RLQ pelvic pain. Seen yesterday for same with minimal pain. Sharp pain x 30 min ago PTA. 10/10.  Pertinent H&P findings include $ weeks pregnant. US yesterday with negative IUP or ectopic. N/V. No vaginal bleeding  Based on initial evaluation, labs are indicated and radiology studies are indicated.  Patient counseled on process, plan, and necessity for staying for completing the evaluation.    Audry PiliMohr, Mckay Tegtmeyer, PA-C 05/31/17 1929    Margarita Grizzleay, Danielle, MD 06/03/17 [phone]

## 2017-05-31 NOTE — ED Notes (Signed)
Pt seen walking back in

## 2017-05-31 NOTE — ED Notes (Signed)
Contacted [facility] for tx to [facility], Dr. Turner DanielsLuke Eure accepting

## 2017-05-31 NOTE — ED Notes (Signed)
Pt family member to [facility] requesting if pt could have Tylenol, upon further assessment pt is allergic to Tylenol. Pt states she doesn't care if she is allergic and if it makes her itch, she still wants it. Informed pt I will not be administering anything that she is allergic to.

## 2017-05-31 NOTE — ED Notes (Signed)
Delay in lab draw,,pt currently in ultrasound.

## 2017-06-01 ENCOUNTER — Other Ambulatory Visit: Payer: Self-pay

## 2017-06-01 ENCOUNTER — Encounter (HOSPITAL_COMMUNITY): Payer: Self-pay | Admitting: *Deleted

## 2017-06-01 DIAGNOSIS — R109 Unspecified abdominal pain: Secondary | ICD-10-CM | POA: Diagnosis not present

## 2017-06-01 DIAGNOSIS — O26891 Other specified pregnancy related conditions, first trimester: Secondary | ICD-10-CM | POA: Diagnosis present

## 2017-06-01 DIAGNOSIS — O209 Hemorrhage in early pregnancy, unspecified: Secondary | ICD-10-CM | POA: Diagnosis not present

## 2017-06-01 LAB — CBC WITH DIFFERENTIAL/PLATELET
Basophils Absolute: 0 10*3/uL (ref 0.0–0.1)
Basophils Relative: 0 %
EOS PCT: 0 %
Eosinophils Absolute: 0 10*3/uL (ref 0.0–0.7)
HEMATOCRIT: 30.1 % — AB (ref 36.0–46.0)
HEMOGLOBIN: 10.2 g/dL — AB (ref 12.0–15.0)
LYMPHS ABS: 4.7 10*3/uL — AB (ref 0.7–4.0)
LYMPHS PCT: 41 %
MCH: 27.6 pg (ref 26.0–34.0)
MCHC: 33.9 g/dL (ref 30.0–36.0)
MCV: 81.4 fL (ref 78.0–100.0)
Monocytes Absolute: 0.5 10*3/uL (ref 0.1–1.0)
Monocytes Relative: 5 %
NEUTROS ABS: 6.1 10*3/uL (ref 1.7–7.7)
NEUTROS PCT: 54 %
Platelets: 201 10*3/uL (ref 150–400)
RBC: 3.7 MIL/uL — AB (ref 3.87–5.11)
RDW: 13.6 % (ref 11.5–15.5)
WBC: 11.3 10*3/uL — AB (ref 4.0–10.5)

## 2017-06-01 LAB — TYPE AND SCREEN
ABO/RH(D): A POS
Antibody Screen: NEGATIVE

## 2017-06-01 LAB — HCG, QUANTITATIVE, PREGNANCY: hCG, Beta Chain, Quant, S: 445 m[IU]/mL — ABNORMAL HIGH (ref <5)

## 2017-06-01 MED ORDER — PRENATAL MULTIVITAMIN CH
1.0000 | ORAL_TABLET | Freq: Every day | ORAL | Status: DC
  Filled 2017-06-01: qty 1

## 2017-06-01 MED ORDER — ZOLPIDEM TARTRATE 5 MG PO TABS
5.0000 mg | ORAL_TABLET | Freq: Every evening | ORAL | Status: DC | PRN
  Administered 2017-06-01: 5 mg via ORAL
  Filled 2017-06-01: qty 1

## 2017-06-01 MED ORDER — PRENATAL MULTIVITAMIN CH: 1.0000 | ORAL_TABLET | Freq: Every day | ORAL | 11 refills | Status: DC

## 2017-06-01 MED ORDER — LACTATED RINGERS IV SOLN
INTRAVENOUS | Status: DC
  Administered 2017-06-01 (×2): via INTRAVENOUS

## 2017-06-01 MED ORDER — KETOROLAC TROMETHAMINE 30 MG/ML IJ SOLN
30.0000 mg | Freq: Once | INTRAMUSCULAR | Status: AC
Start: 2017-06-01 — End: 2017-06-01
  Administered 2017-06-01: 30 mg via INTRAVENOUS
  Filled 2017-06-01: qty 1

## 2017-06-01 NOTE — MA[facility] Note (Signed)
[facility] Reports having a sharp pain in her LRQ earlier this evening after having an argument with someone on the phone. Pain did not go away and got worse so [facility] While waiting to be seen she started having some vaginal bleeding. U/s reported inconclusive for ectopic. [facility] Pt given MS prior mto transport and pt stated pain is gone.

## 2017-06-01 NOTE — Progress Notes (Signed)

## 2017-06-01 NOTE — Plan of Care (Signed)
  Education: Knowledge of General Education information will improve 06/01/2017 0219 - Progressing by Bobbye MortonAcheampong, Shericka Johnstone, RN   Activity: Risk for activity intolerance will decrease 06/01/2017 0219 - Progressing by Bobbye MortonAcheampong, Draycen Leichter, RN   Nutrition: Adequate nutrition will be maintained 06/01/2017 0219 by Bobbye MortonAcheampong, Chrisy Hillebrand, RN Note Pt educated on being NPO per order.

## 2017-06-01 NOTE — H&P (Signed)
Chief Complaint  Patient presents with  . Abdominal Pain  . Flank Pain   HPI  Ms. Lori Baldwin is a 22 y.o. G2P1001 at [redacted]w[redacted]d who presents to MAU today as a transfer from [facility] with complaint of vaginal bleeding and pelvic pain. The patient states that she had severe sudden onset of RLQ pain. She had moderate vaginal bleeding, but states no bleeding now. She was seen in MAU yesterday and had [place] and hCG drawn. US showed nothing in the uterus or adnexa and hCG was 333. [place] at [facility] today showed moderate fluid in the pelvis concerning for hemorrhage.           OB History    Gravida Para Term Preterm AB Living   2 1 1     1    SAB TAB Ectopic Multiple Live Births           1          Past Medical History:  Diagnosis Date  . Anemia   . BV (bacterial vaginosis)   . Gonorrhea   . Headache   . Pharyngitis   . Yeast vaginitis          Past Surgical History:  Procedure Laterality Date  . CESAREAN SECTION           Family History  Problem Relation Age of Onset  . Cancer Maternal Grandmother        breast  . Sickle cell anemia Paternal Grandmother   . Hypertension Mother   . Diabetes Maternal Grandfather     Social History        Tobacco Use  . Smoking status: Never Smoker  . Smokeless tobacco: Never Used  Substance Use Topics  . Alcohol use: No  . Drug use: Yes    Types: Marijuana    Comment: last was early Dec    Allergies:      Allergies  Allergen Reactions  . Aspirin Itching  . Tylenol [Acetaminophen] Itching           Medications Prior to Admission  Medication Sig Dispense Refill Last Dose  . fluconazole (DIFLUCAN) 150 MG tablet Take 1 tablet (150 mg total) by mouth daily. Take second dose 72 hours later if symptoms still persists. (Patient not taking: Reported on 05/31/2017) 2 tablet 0 Not Taking at Unknown time    Review of Systems  Constitutional: Negative for fever.  Gastrointestinal: Positive for abdominal pain.  Negative for constipation, diarrhea, nausea and vomiting.  Genitourinary: Positive for vaginal bleeding. Negative for vaginal discharge.   Physical Exam   Blood pressure 102/62, pulse 97, temperature 98.5 F (36.9 C), resp. rate 18, height 5' (1.524 m), weight 100 lb (45.4 kg), last menstrual period 04/27/2017, SpO2 100 %, unknown if currently breastfeeding.  Physical Exam  Nursing note and vitals reviewed. Constitutional: She is oriented to person, place, and time. She appears well-developed and well-nourished. No distress.  HENT:  Head: Normocephalic and atraumatic.  Cardiovascular: Normal rate.  Respiratory: Effort normal.  GI: Soft. There is no tenderness.  Neurological: She is alert and oriented to person, place, and time.  Skin: Skin is warm and dry. No erythema.  Psychiatric: She has a normal mood and affect.     LabResultsLast24Hours       Results for orders placed or performed during the hospital encounter of 05/31/17 (from the past 24 hour(s))  Urinalysis, Routine w reflex microscopic     Status: Abnormal   Collection Time: 05/31/17  7:20 PM  Result Value Ref Range   Color, Urine YELLOW YELLOW   APPearance CLOUDY (A) CLEAR   Specific Gravity, Urine 1.028 1.005 - 1.030   pH 5.0 5.0 - 8.0   Glucose, UA NEGATIVE NEGATIVE mg/dL   Hgb urine dipstick SMALL (A) NEGATIVE   Bilirubin Urine NEGATIVE NEGATIVE   Ketones, ur NEGATIVE NEGATIVE mg/dL   Protein, ur 30 (A) NEGATIVE mg/dL   Nitrite NEGATIVE NEGATIVE   Leukocytes, UA NEGATIVE NEGATIVE   RBC / HPF 0-5 0 - 5 RBC/hpf   WBC, UA 0-5 0 - 5 WBC/hpf   Bacteria, UA RARE (A) NONE SEEN   Squamous Epithelial / LPF 6-30 (A) NONE SEEN   Mucus PRESENT   Lipase, blood     Status: None   Collection Time: 05/31/17  7:26 PM  Result Value Ref Range   Lipase 28 11 - 51 U/L  Comprehensive metabolic panel     Status: Abnormal   Collection Time: 05/31/17  7:26 PM  Result Value Ref Range   Sodium 135 135  - 145 mmol/L   Potassium 3.4 (L) 3.5 - 5.1 mmol/L   Chloride 104 101 - 111 mmol/L   CO2 22 22 - 32 mmol/L   Glucose, Bld 106 (H) 65 - 99 mg/dL   BUN 9 6 - 20 mg/dL   Creatinine, Ser 1.61 0.44 - 1.00 mg/dL   Calcium 9.1 8.9 - 09.6 mg/dL   Total Protein 7.9 6.5 - 8.1 g/dL   Albumin 4.3 3.5 - 5.0 g/dL   AST 21 15 - 41 U/L   ALT 15 14 - 54 U/L   Alkaline Phosphatase 49 38 - 126 U/L   Total Bilirubin 0.8 0.3 - 1.2 mg/dL   GFR calc non Af Amer >60 >60 mL/min   GFR calc Af Amer >60 >60 mL/min   Anion gap 9 5 - 15  CBC     Status: Abnormal   Collection Time: 05/31/17  7:26 PM  Result Value Ref Range   WBC 10.7 (H) 4.0 - 10.5 K/uL   RBC 4.34 3.87 - 5.11 MIL/uL   Hemoglobin 11.8 (L) 12.0 - 15.0 g/dL   HCT 04.5 (L) 40.9 - 81.1 %   MCV 82.3 78.0 - 100.0 fL   MCH 27.2 26.0 - 34.0 pg   MCHC 33.1 30.0 - 36.0 g/dL   RDW 91.4 78.2 - 95.6 %   Platelets 235 150 - 400 K/uL  hCG, quantitative, pregnancy     Status: Abnormal   Collection Time: 05/31/17  7:28 PM  Result Value Ref Range   hCG, Beta Chain, Quant, S 544 (H) <5 mIU/mL  I-Stat beta hCG blood, ED     Status: Abnormal   Collection Time: 05/31/17  7:56 PM  Result Value Ref Range   I-stat hCG, quantitative 477.0 (H) <5 mIU/mL   Comment 3          ABO/Rh     Status: None   Collection Time: 05/31/17 10:28 PM  Result Value Ref Range   ABO/RH(D) A POS   Type and screen [facility]     Status: None   Collection Time: 05/31/17 10:28 PM  Result Value Ref Range   ABO/RH(D) A POS    Antibody Screen NEG    Sample Expiration 06/03/2017      US Pelvic Doppler (torsion R/o Or Mass Arterial Flow)  Result Date: 05/31/2017 CLINICAL DATA:  Abdominal pain with positive pregnancy test. Rising beta HCG to  544 today versus 333 yesterday. EXAM: OBSTETRIC <14 WK US AND TRANSVAGINAL OB US DOPPLER ULTRASOUND OF OVARIES TECHNIQUE: Both transabdominal and transvaginal ultrasound examinations were  performed for complete evaluation of the gestation as well as the maternal uterus, adnexal regions, and pelvic cul-de-sac. Transvaginal technique was performed to assess early pregnancy. Color and duplex Doppler ultrasound was utilized to evaluate blood flow to the ovaries. COMPARISON:  Pelvic ultrasound from 1 day prior. FINDINGS: A moderate amount of hypoechoic complex fluid is now seen within the cul-de-sac and pelvis, new since yesterday's exam and concerning for hemorrhage. An intrauterine pregnancy is not identified. Likewise an ectopic pregnancy is not definitively identified but must be assumed given the presence of complex free fluid within the pelvis. Patient reportedly was in severe pain per discussion with the ultrasound technologist. Intrauterine gestational sac: None Yolk sac:  Not Visualized. Embryo:  Not Visualized. Cardiac Activity: Not Visualized. Heart Rate: Not applicable Subchorionic hemorrhage:  None visualized. Maternal uterus/adnexae: Retroverted uterus. Right ovary visualized measuring 3.2 x 1.9 x 2 cm. Left ovary visualized measuring 3.4 x 1.7 x 1.7 cm. Corpus luteal cyst is suggested of the left ovary measuring 1.4 x 1.2 x 1.4 cm. Pulsed Doppler evaluation of both ovaries demonstrates normal appearing low-resistance arterial and venous waveforms. IMPRESSION: 1. New moderate complex free fluid within the pelvis concerning for hemorrhage. Given positive pregnancy test and lack of intrauterine gestation, ruptured ectopic is the leading consideration for the hypoechoic complex fluid. Critical Value/emergent results were called by telephone at the time of interpretation on 05/31/2017 at 10:06 pm to PA Sharen Hecklaudia Gibbons who verbally acknowledged these results. 2. No ovarian torsion. Electronically Signed   By: Tollie Ethavid  Kwon M.D.   On: 05/31/2017 22:07   [place] Ob Less Than 14 Weeks With Ob Transvaginal  Result Date: 05/31/2017 CLINICAL DATA:  Abdominal pain with positive pregnancy test. Rising beta  HCG to 544 today versus 333 yesterday. EXAM: OBSTETRIC <14 WK US AND TRANSVAGINAL OB US DOPPLER ULTRASOUND OF OVARIES TECHNIQUE: Both transabdominal and transvaginal ultrasound examinations were performed for complete evaluation of the gestation as well as the maternal uterus, adnexal regions, and pelvic cul-de-sac. Transvaginal technique was performed to assess early pregnancy. Color and duplex Doppler ultrasound was utilized to evaluate blood flow to the ovaries. COMPARISON:  Pelvic ultrasound from 1 day prior. FINDINGS: A moderate amount of hypoechoic complex fluid is now seen within the cul-de-sac and pelvis, new since yesterday's exam and concerning for hemorrhage. An intrauterine pregnancy is not identified. Likewise an ectopic pregnancy is not definitively identified but must be assumed given the presence of complex free fluid within the pelvis. Patient reportedly was in severe pain per discussion with the ultrasound technologist. Intrauterine gestational sac: None Yolk sac:  Not Visualized. Embryo:  Not Visualized. Cardiac Activity: Not Visualized. Heart Rate: Not applicable Subchorionic hemorrhage:  None visualized. Maternal uterus/adnexae: Retroverted uterus. Right ovary visualized measuring 3.2 x 1.9 x 2 cm. Left ovary visualized measuring 3.4 x 1.7 x 1.7 cm. Corpus luteal cyst is suggested of the left ovary measuring 1.4 x 1.2 x 1.4 cm. Pulsed Doppler evaluation of both ovaries demonstrates normal appearing low-resistance arterial and venous waveforms. IMPRESSION: 1. New moderate complex free fluid within the pelvis concerning for hemorrhage. Given positive pregnancy test and lack of intrauterine gestation, ruptured ectopic is the leading consideration for the hypoechoic complex fluid. Critical Value/emergent results were called by telephone at the time of interpretation on 05/31/2017 at 10:06 pm to PA Sharen Hecklaudia Gibbons who verbally acknowledged  these results. 2. No ovarian torsion. Electronically Signed    By: Tollie Eth M.D.   On: 05/31/2017 22:07     MAU Course  Procedures None  MDM Reviewed labs and [place] from [facility].  Discussed patient with Dr. Despina Hidden. Admit for observation tonight and repeat hCG and CBC in the morning at 0700. NPO.  Assessment and Plan  A: Abdominal pain in pregnancy, first trimester Pregnancy of unknown location Vaginal bleeding in pregnancy, first trimester  P: Admit for observation NPO tonight Repeat labs in the morning   Vonzella Nipple, PA-C 06/01/2017, 12:26 AM   Lazaro Arms, MD 06/01/2017 10:19 AM

## 2017-06-01 NOTE — Discharge Summary (Signed)
Physician Discharge Summary  [facility] MRN: [id number] DOB/AGE: 1995/10/07 22 y.o.  Admit date: 05/31/2017 Discharge date: 06/01/2017  Admission Diagnoses: Abd pain in pregnancy  Discharge Diagnoses:  Active Problems:   Abdominal pain in pregnancy, first trimester   Discharged Condition: good  Hospital Course: Pt was observed d/t abd pain and blood in pelvis in a setting of unknown pregnancy location. Pt's pain resolved, VS remained stable, progressed to ambulating and voiding and tolerating diet without problems, H & H remained stable. Slight decrease in BHCG.  Discussed with pt to early to tell if viable pregnancy or not and definite location of pregnancy. Discussed need to follow BHCG for additional information.  Discharge instructions reviewed with pt.  Discharged home with follow up BHCG in clinic on Monday   Consults: None  Significant Diagnostic Studies: labs:   Treatments: IV hydration  Discharge Exam: Blood pressure (!) 94/48, pulse 79, temperature 99 F (37.2 C), temperature source Oral, resp. rate 18, height 5' (1.524 m), weight 45.4 kg (100 lb), last menstrual period 04/27/2017, SpO2 100 %, unknown if currently breastfeeding. Lungs clear  Heart RRR Abd soft + BS non tender GU no bleeding  Disposition: 01-Home or Self Care  Discharge Instructions    Call MD for:  difficulty breathing, headache or visual disturbances   Complete by:  As directed    Call MD for:  extreme fatigue   Complete by:  As directed    Call MD for:  persistant dizziness or light-headedness   Complete by:  As directed    Call MD for:  persistant nausea and vomiting   Complete by:  As directed    Call MD for:  severe uncontrolled pain   Complete by:  As directed    Call MD for:  temperature >100.4   Complete by:  As directed    Diet - low sodium heart healthy   Complete by:  As directed    Discharge instructions   Complete by:  As directed    Pelvic rest  Return to  MAU for ant heavy vaginal bleeding, abd/pelvic pain or any concerns   Increase activity slowly   Complete by:  As directed      Allergies as of 06/01/2017      Reactions   Aspirin Itching   Tylenol [acetaminophen] Itching      Medication List    TAKE these medications   fluconazole 150 MG tablet Commonly known as:  DIFLUCAN Take 1 tablet (150 mg total) by mouth daily. Take second dose 72 hours later if symptoms still persists.   prenatal multivitamin Tabs tablet Take 1 tablet by mouth daily at 12 noon. Start taking on:  06/02/2017        Signed: Hermina StaggersMichael L Florita Nitsch 06/01/2017, 1:28 PM

## 2017-06-01 NOTE — Progress Notes (Signed)
Subjective: Interval History: Pain is mild objectively, it appears pt is rating higher than it is objectively, she is talking to her mother, laughing and smiling and rates it at 8 Her exam is benign  Objective: Vital signs in last 24 hours: Temp:  [97.8 F (36.6 C)-99 F (37.2 C)] 99 F (37.2 C) (01/05 0800) Pulse Rate:  [61-97] 71 (01/05 0800) Resp:  [14-20] 18 (01/05 0800) BP: (90-116)/(51-96) 90/51 (01/05 0800) SpO2:  [96 %-100 %] 100 % (01/05 0800) Weight:  [100 lb (45.4 kg)] 100 lb (45.4 kg) (01/04 1918)  Intake/Output from previous day: 01/04 0701 - 01/05 0700 In: 1000  Out: 300 [Urine:300] Intake/Output this shift: No intake/output data recorded.  Gen WDWN female NAD Abdomen is soft no guarding completely benign abdominal exam  Results for orders placed or performed during the hospital encounter of 05/31/17 (from the past 24 hour(s))  Urinalysis, Routine w reflex microscopic     Status: Abnormal   Collection Time: 05/31/17  7:20 PM  Result Value Ref Range   Color, Urine YELLOW YELLOW   APPearance CLOUDY (A) CLEAR   Specific Gravity, Urine 1.028 1.005 - 1.030   pH 5.0 5.0 - 8.0   Glucose, UA NEGATIVE NEGATIVE mg/dL   Hgb urine dipstick SMALL (A) NEGATIVE   Bilirubin Urine NEGATIVE NEGATIVE   Ketones, ur NEGATIVE NEGATIVE mg/dL   Protein, ur 30 (A) NEGATIVE mg/dL   Nitrite NEGATIVE NEGATIVE   Leukocytes, UA NEGATIVE NEGATIVE   RBC / HPF 0-5 0 - 5 RBC/hpf   WBC, UA 0-5 0 - 5 WBC/hpf   Bacteria, UA RARE (A) NONE SEEN   Squamous Epithelial / LPF 6-30 (A) NONE SEEN   Mucus PRESENT   Lipase, blood     Status: None   Collection Time: 05/31/17  7:26 PM  Result Value Ref Range   Lipase 28 11 - 51 U/L  Comprehensive metabolic panel     Status: Abnormal   Collection Time: 05/31/17  7:26 PM  Result Value Ref Range   Sodium 135 135 - 145 mmol/L   Potassium 3.4 (L) 3.5 - 5.1 mmol/L   Chloride 104 101 - 111 mmol/L   CO2 22 22 - 32 mmol/L   Glucose, Bld 106 (H) 65 - 99  mg/dL   BUN 9 6 - 20 mg/dL   Creatinine, Ser 9.60 0.44 - 1.00 mg/dL   Calcium 9.1 8.9 - 45.4 mg/dL   Total Protein 7.9 6.5 - 8.1 g/dL   Albumin 4.3 3.5 - 5.0 g/dL   AST 21 15 - 41 U/L   ALT 15 14 - 54 U/L   Alkaline Phosphatase 49 38 - 126 U/L   Total Bilirubin 0.8 0.3 - 1.2 mg/dL   GFR calc non Af Amer >60 >60 mL/min   GFR calc Af Amer >60 >60 mL/min   Anion gap 9 5 - 15  CBC     Status: Abnormal   Collection Time: 05/31/17  7:26 PM  Result Value Ref Range   WBC 10.7 (H) 4.0 - 10.5 K/uL   RBC 4.34 3.87 - 5.11 MIL/uL   Hemoglobin 11.8 (L) 12.0 - 15.0 g/dL   HCT 09.8 (L) 11.9 - 14.7 %   MCV 82.3 78.0 - 100.0 fL   MCH 27.2 26.0 - 34.0 pg   MCHC 33.1 30.0 - 36.0 g/dL   RDW 82.9 56.2 - 13.0 %   Platelets 235 150 - 400 K/uL  hCG, quantitative, pregnancy     Status: Abnormal  Collection Time: 05/31/17  7:28 PM  Result Value Ref Range   hCG, Beta Chain, Quant, S 544 (H) <5 mIU/mL  I-Stat beta hCG blood, ED     Status: Abnormal   Collection Time: 05/31/17  7:56 PM  Result Value Ref Range   I-stat hCG, quantitative 477.0 (H) <5 mIU/mL   Comment 3          ABO/Rh     Status: None   Collection Time: 05/31/17 10:28 PM  Result Value Ref Range   ABO/RH(D) A POS   Type and screen [facility]     Status: None   Collection Time: 05/31/17 10:28 PM  Result Value Ref Range   ABO/RH(D) A POS    Antibody Screen NEG    Sample Expiration 06/03/2017   Type and screen [facility]     Status: None   Collection Time: 06/01/17  1:21 AM  Result Value Ref Range   ABO/RH(D) A POS    Antibody Screen NEG    Sample Expiration 06/04/2017   CBC with Differential/Platelet     Status: Abnormal   Collection Time: 06/01/17  7:25 AM  Result Value Ref Range   WBC 11.3 (H) 4.0 - 10.5 K/uL   RBC 3.70 (L) 3.87 - 5.11 MIL/uL   Hemoglobin 10.2 (L) 12.0 - 15.0 g/dL   HCT 78.2 (L) 95.6 - 21.3 %   MCV 81.4 78.0 - 100.0 fL   MCH 27.6 26.0 - 34.0 pg   MCHC 33.9 30.0 - 36.0  g/dL   RDW 08.6 57.8 - 46.9 %   Platelets 201 150 - 400 K/uL   Neutrophils Relative % 54 %   Neutro Abs 6.1 1.7 - 7.7 K/uL   Lymphocytes Relative 41 %   Lymphs Abs 4.7 (H) 0.7 - 4.0 K/uL   Monocytes Relative 5 %   Monocytes Absolute 0.5 0.1 - 1.0 K/uL   Eosinophils Relative 0 %   Eosinophils Absolute 0.0 0.0 - 0.7 K/uL   Basophils Relative 0 %   Basophils Absolute 0.0 0.0 - 0.1 K/uL  hCG, quantitative, pregnancy     Status: Abnormal   Collection Time: 06/01/17  7:25 AM  Result Value Ref Range   hCG, Beta Chain, Quant, S 445 (H) <5 mIU/mL    Studies/Results: US Ob Comp Less 14 Wks  Result Date: 05/30/2017 CLINICAL DATA:  Pregnant patient with cramping. EXAM: OBSTETRIC <14 WK [place] AND TRANSVAGINAL OB US TECHNIQUE: Both transabdominal and transvaginal ultrasound examinations were performed for complete evaluation of the gestation as well as the maternal uterus, adnexal regions, and pelvic cul-de-sac. Transvaginal technique was performed to assess early pregnancy. COMPARISON:  Pelvic ultrasound 08/17/2011 FINDINGS: Intrauterine gestational sac: None Yolk sac:  Not Visualized. Embryo:  Not Visualized. Cardiac Activity: Not Visualized. Maternal uterus/adnexae: Normal right and left ovaries. No free fluid in the pelvis. Small amount of debris within the endometrial canal. IMPRESSION: No intrauterine gestation identified. In the setting of positive pregnancy test and no definite intrauterine pregnancy, this reflects a pregnancy of unknown location. Differential considerations include early normal IUP, abnormal IUP, or nonvisualized ectopic pregnancy. Differentiation is achieved with serial beta HCG supplemented by repeat sonography as clinically warranted. Small amount of debris in the endometrial canal. Recommend attention on follow-up exam. Electronically Signed   By: Annia Belt M.D.   On: 05/30/2017 15:22   US Ob Transvaginal  Result Date: 05/30/2017 CLINICAL DATA:  Pregnant patient with cramping.  EXAM: OBSTETRIC <14 WK  US AND TRANSVAGINAL OB US TECHNIQUE: Both transabdominal and transvaginal ultrasound examinations were performed for complete evaluation of the gestation as well as the maternal uterus, adnexal regions, and pelvic cul-de-sac. Transvaginal technique was performed to assess early pregnancy. COMPARISON:  Pelvic ultrasound 08/17/2011 FINDINGS: Intrauterine gestational sac: None Yolk sac:  Not Visualized. Embryo:  Not Visualized. Cardiac Activity: Not Visualized. Maternal uterus/adnexae: Normal right and left ovaries. No free fluid in the pelvis. Small amount of debris within the endometrial canal. IMPRESSION: No intrauterine gestation identified. In the setting of positive pregnancy test and no definite intrauterine pregnancy, this reflects a pregnancy of unknown location. Differential considerations include early normal IUP, abnormal IUP, or nonvisualized ectopic pregnancy. Differentiation is achieved with serial beta HCG supplemented by repeat sonography as clinically warranted. Small amount of debris in the endometrial canal. Recommend attention on follow-up exam. Electronically Signed   By: Annia Beltrew  Davis M.D.   On: 05/30/2017 15:22   [place] Pelvic Doppler (torsion R/o Or Mass Arterial Flow)  Result Date: 05/31/2017 CLINICAL DATA:  Abdominal pain with positive pregnancy test. Rising beta HCG to 544 today versus 333 yesterday. EXAM: OBSTETRIC <14 WK US AND TRANSVAGINAL OB US DOPPLER ULTRASOUND OF OVARIES TECHNIQUE: Both transabdominal and transvaginal ultrasound examinations were performed for complete evaluation of the gestation as well as the maternal uterus, adnexal regions, and pelvic cul-de-sac. Transvaginal technique was performed to assess early pregnancy. Color and duplex Doppler ultrasound was utilized to evaluate blood flow to the ovaries. COMPARISON:  Pelvic ultrasound from 1 day prior. FINDINGS: A moderate amount of hypoechoic complex fluid is now seen within the cul-de-sac and pelvis,  new since yesterday's exam and concerning for hemorrhage. An intrauterine pregnancy is not identified. Likewise an ectopic pregnancy is not definitively identified but must be assumed given the presence of complex free fluid within the pelvis. Patient reportedly was in severe pain per discussion with the ultrasound technologist. Intrauterine gestational sac: None Yolk sac:  Not Visualized. Embryo:  Not Visualized. Cardiac Activity: Not Visualized. Heart Rate: Not applicable Subchorionic hemorrhage:  None visualized. Maternal uterus/adnexae: Retroverted uterus. Right ovary visualized measuring 3.2 x 1.9 x 2 cm. Left ovary visualized measuring 3.4 x 1.7 x 1.7 cm. Corpus luteal cyst is suggested of the left ovary measuring 1.4 x 1.2 x 1.4 cm. Pulsed Doppler evaluation of both ovaries demonstrates normal appearing low-resistance arterial and venous waveforms. IMPRESSION: 1. New moderate complex free fluid within the pelvis concerning for hemorrhage. Given positive pregnancy test and lack of intrauterine gestation, ruptured ectopic is the leading consideration for the hypoechoic complex fluid. Critical Value/emergent results were called by telephone at the time of interpretation on 05/31/2017 at 10:06 pm to PA Sharen Hecklaudia Gibbons who verbally acknowledged these results. 2. No ovarian torsion. Electronically Signed   By: Tollie Ethavid  Kwon M.D.   On: 05/31/2017 22:07   [place] Ob Less Than 14 Weeks With Ob Transvaginal  Result Date: 05/31/2017 CLINICAL DATA:  Abdominal pain with positive pregnancy test. Rising beta HCG to 544 today versus 333 yesterday. EXAM: OBSTETRIC <14 WK US AND TRANSVAGINAL OB US DOPPLER ULTRASOUND OF OVARIES TECHNIQUE: Both transabdominal and transvaginal ultrasound examinations were performed for complete evaluation of the gestation as well as the maternal uterus, adnexal regions, and pelvic cul-de-sac. Transvaginal technique was performed to assess early pregnancy. Color and duplex Doppler ultrasound was  utilized to evaluate blood flow to the ovaries. COMPARISON:  Pelvic ultrasound from 1 day prior. FINDINGS: A moderate amount of hypoechoic complex fluid is now seen within the  cul-de-sac and pelvis, new since yesterday's exam and concerning for hemorrhage. An intrauterine pregnancy is not identified. Likewise an ectopic pregnancy is not definitively identified but must be assumed given the presence of complex free fluid within the pelvis. Patient reportedly was in severe pain per discussion with the ultrasound technologist. Intrauterine gestational sac: None Yolk sac:  Not Visualized. Embryo:  Not Visualized. Cardiac Activity: Not Visualized. Heart Rate: Not applicable Subchorionic hemorrhage:  None visualized. Maternal uterus/adnexae: Retroverted uterus. Right ovary visualized measuring 3.2 x 1.9 x 2 cm. Left ovary visualized measuring 3.4 x 1.7 x 1.7 cm. Corpus luteal cyst is suggested of the left ovary measuring 1.4 x 1.2 x 1.4 cm. Pulsed Doppler evaluation of both ovaries demonstrates normal appearing low-resistance arterial and venous waveforms. IMPRESSION: 1. New moderate complex free fluid within the pelvis concerning for hemorrhage. Given positive pregnancy test and lack of intrauterine gestation, ruptured ectopic is the leading consideration for the hypoechoic complex fluid. Critical Value/emergent results were called by telephone at the time of interpretation on 05/31/2017 at 10:06 pm to PA Sharen Heck who verbally acknowledged these results. 2. No ovarian torsion. Electronically Signed   By: Tollie Eth M.D.   On: 05/31/2017 22:07    Scheduled Meds: . prenatal multivitamin  1 tablet Oral Q1200   Continuous Infusions: . lactated ringers 125 mL/hr at 06/01/17 0823   PRN Meds:zolpidem  Assessment/Plan: Early pregnancy status is unclear  HCG levels suggest a very early pregnancy and serial sonograms are significant for blood in pelvis Based on the clincial exam and avilable lab/sonogram data  favor hemorrhagic corpus luteum vs ectopic.  Either wat viability status is unclear  Dr Alysia Penna is aware of the uncertainty of the diagnosis and re evaluate the patient at midday and decide on a disposition at that time   LOS: 0 days   Lori Dyke EurePatient ID: Lori Baldwin, female   DOB: 1996/01/23, 23 y.o.   MRN: [id number]

## 2017-06-01 NOTE — MAU Provider Note (Signed)
History     CSN: [id number]  Arrival date and time: 05/31/17 7253   First Provider Initiated Contact with Patient 06/01/17 0026      Chief Complaint  Patient presents with  . Abdominal Pain  . Flank Pain   HPI  Ms. Lori Baldwin is a 22 y.o. G2P1001 at [redacted]w[redacted]d who presents to MAU today as a transfer from [facility] with complaint of vaginal bleeding and pelvic pain. The patient states that she had severe sudden onset of RLQ pain. She had moderate vaginal bleeding, but states no bleeding now. She was seen in MAU yesterday and had [place] and hCG drawn. US showed nothing in the uterus or adnexa and hCG was 333. [place] at [facility] today showed moderate fluid in the pelvis concerning for hemorrhage.   OB History    Gravida Para Term Preterm AB Living   2 1 1     1    SAB TAB Ectopic Multiple Live Births           1      Past Medical History:  Diagnosis Date  . Anemia   . BV (bacterial vaginosis)   . Gonorrhea   . Headache   . Pharyngitis   . Yeast vaginitis     Past Surgical History:  Procedure Laterality Date  . CESAREAN SECTION      Family History  Problem Relation Age of Onset  . Cancer Maternal Grandmother        breast  . Sickle cell anemia Paternal Grandmother   . Hypertension Mother   . Diabetes Maternal Grandfather     Social History   Tobacco Use  . Smoking status: Never Smoker  . Smokeless tobacco: Never Used  Substance Use Topics  . Alcohol use: No  . Drug use: Yes    Types: Marijuana    Comment: last was early Dec    Allergies:  Allergies  Allergen Reactions  . Aspirin Itching  . Tylenol [Acetaminophen] Itching    Medications Prior to Admission  Medication Sig Dispense Refill Last Dose  . fluconazole (DIFLUCAN) 150 MG tablet Take 1 tablet (150 mg total) by mouth daily. Take second dose 72 hours later if symptoms still persists. (Patient not taking: Reported on 05/31/2017) 2 tablet 0 Not Taking at Unknown time    Review of Systems  Constitutional: Negative  for fever.  Gastrointestinal: Positive for abdominal pain. Negative for constipation, diarrhea, nausea and vomiting.  Genitourinary: Positive for vaginal bleeding. Negative for vaginal discharge.   Physical Exam   Blood pressure 102/62, pulse 97, temperature 98.5 F (36.9 C), resp. rate 18, height 5' (1.524 m), weight 100 lb (45.4 kg), last menstrual period 04/27/2017, SpO2 100 %, unknown if currently breastfeeding.  Physical Exam  Nursing note and vitals reviewed. Constitutional: She is oriented to person, place, and time. She appears well-developed and well-nourished. No distress.  HENT:  Head: Normocephalic and atraumatic.  Cardiovascular: Normal rate.  Respiratory: Effort normal.  GI: Soft. There is no tenderness.  Neurological: She is alert and oriented to person, place, and time.  Skin: Skin is warm and dry. No erythema.  Psychiatric: She has a normal mood and affect.     Results for orders placed or performed during the hospital encounter of 05/31/17 (from the past 24 hour(s))  Urinalysis, Routine w reflex microscopic     Status: Abnormal   Collection Time: 05/31/17  7:20 PM  Result Value Ref Range   Color, Urine YELLOW YELLOW   APPearance  CLOUDY (A) CLEAR   Specific Gravity, Urine 1.028 1.005 - 1.030   pH 5.0 5.0 - 8.0   Glucose, UA NEGATIVE NEGATIVE mg/dL   Hgb urine dipstick SMALL (A) NEGATIVE   Bilirubin Urine NEGATIVE NEGATIVE   Ketones, ur NEGATIVE NEGATIVE mg/dL   Protein, ur 30 (A) NEGATIVE mg/dL   Nitrite NEGATIVE NEGATIVE   Leukocytes, UA NEGATIVE NEGATIVE   RBC / HPF 0-5 0 - 5 RBC/hpf   WBC, UA 0-5 0 - 5 WBC/hpf   Bacteria, UA RARE (A) NONE SEEN   Squamous Epithelial / LPF 6-30 (A) NONE SEEN   Mucus PRESENT   Lipase, blood     Status: None   Collection Time: 05/31/17  7:26 PM  Result Value Ref Range   Lipase 28 11 - 51 U/L  Comprehensive metabolic panel     Status: Abnormal   Collection Time: 05/31/17  7:26 PM  Result Value Ref Range   Sodium 135 135  - 145 mmol/L   Potassium 3.4 (L) 3.5 - 5.1 mmol/L   Chloride 104 101 - 111 mmol/L   CO2 22 22 - 32 mmol/L   Glucose, Bld 106 (H) 65 - 99 mg/dL   BUN 9 6 - 20 mg/dL   Creatinine, Ser 4.09 0.44 - 1.00 mg/dL   Calcium 9.1 8.9 - 81.1 mg/dL   Total Protein 7.9 6.5 - 8.1 g/dL   Albumin 4.3 3.5 - 5.0 g/dL   AST 21 15 - 41 U/L   ALT 15 14 - 54 U/L   Alkaline Phosphatase 49 38 - 126 U/L   Total Bilirubin 0.8 0.3 - 1.2 mg/dL   GFR calc non Af Amer >60 >60 mL/min   GFR calc Af Amer >60 >60 mL/min   Anion gap 9 5 - 15  CBC     Status: Abnormal   Collection Time: 05/31/17  7:26 PM  Result Value Ref Range   WBC 10.7 (H) 4.0 - 10.5 K/uL   RBC 4.34 3.87 - 5.11 MIL/uL   Hemoglobin 11.8 (L) 12.0 - 15.0 g/dL   HCT 91.4 (L) 78.2 - 95.6 %   MCV 82.3 78.0 - 100.0 fL   MCH 27.2 26.0 - 34.0 pg   MCHC 33.1 30.0 - 36.0 g/dL   RDW 21.3 08.6 - 57.8 %   Platelets 235 150 - 400 K/uL  hCG, quantitative, pregnancy     Status: Abnormal   Collection Time: 05/31/17  7:28 PM  Result Value Ref Range   hCG, Beta Chain, Quant, S 544 (H) <5 mIU/mL  I-Stat beta hCG blood, ED     Status: Abnormal   Collection Time: 05/31/17  7:56 PM  Result Value Ref Range   I-stat hCG, quantitative 477.0 (H) <5 mIU/mL   Comment 3          ABO/Rh     Status: None   Collection Time: 05/31/17 10:28 PM  Result Value Ref Range   ABO/RH(D) A POS   Type and screen [facility]     Status: None   Collection Time: 05/31/17 10:28 PM  Result Value Ref Range   ABO/RH(D) A POS    Antibody Screen NEG    Sample Expiration 06/03/2017    US Pelvic Doppler (torsion R/o Or Mass Arterial Flow)  Result Date: 05/31/2017 CLINICAL DATA:  Abdominal pain with positive pregnancy test. Rising beta HCG to 544 today versus 333 yesterday. EXAM: OBSTETRIC <14 WK [place] AND TRANSVAGINAL OB US DOPPLER ULTRASOUND OF OVARIES  TECHNIQUE: Both transabdominal and transvaginal ultrasound examinations were performed for complete evaluation of the  gestation as well as the maternal uterus, adnexal regions, and pelvic cul-de-sac. Transvaginal technique was performed to assess early pregnancy. Color and duplex Doppler ultrasound was utilized to evaluate blood flow to the ovaries. COMPARISON:  Pelvic ultrasound from 1 day prior. FINDINGS: A moderate amount of hypoechoic complex fluid is now seen within the cul-de-sac and pelvis, new since yesterday's exam and concerning for hemorrhage. An intrauterine pregnancy is not identified. Likewise an ectopic pregnancy is not definitively identified but must be assumed given the presence of complex free fluid within the pelvis. Patient reportedly was in severe pain per discussion with the ultrasound technologist. Intrauterine gestational sac: None Yolk sac:  Not Visualized. Embryo:  Not Visualized. Cardiac Activity: Not Visualized. Heart Rate: Not applicable Subchorionic hemorrhage:  None visualized. Maternal uterus/adnexae: Retroverted uterus. Right ovary visualized measuring 3.2 x 1.9 x 2 cm. Left ovary visualized measuring 3.4 x 1.7 x 1.7 cm. Corpus luteal cyst is suggested of the left ovary measuring 1.4 x 1.2 x 1.4 cm. Pulsed Doppler evaluation of both ovaries demonstrates normal appearing low-resistance arterial and venous waveforms. IMPRESSION: 1. New moderate complex free fluid within the pelvis concerning for hemorrhage. Given positive pregnancy test and lack of intrauterine gestation, ruptured ectopic is the leading consideration for the hypoechoic complex fluid. Critical Value/emergent results were called by telephone at the time of interpretation on 05/31/2017 at 10:06 pm to PA Sharen Hecklaudia Gibbons who verbally acknowledged these results. 2. No ovarian torsion. Electronically Signed   By: Tollie Ethavid  Kwon M.D.   On: 05/31/2017 22:07   [place] Ob Less Than 14 Weeks With Ob Transvaginal  Result Date: 05/31/2017 CLINICAL DATA:  Abdominal pain with positive pregnancy test. Rising beta HCG to 544 today versus 333 yesterday. EXAM:  OBSTETRIC <14 WK US AND TRANSVAGINAL OB US DOPPLER ULTRASOUND OF OVARIES TECHNIQUE: Both transabdominal and transvaginal ultrasound examinations were performed for complete evaluation of the gestation as well as the maternal uterus, adnexal regions, and pelvic cul-de-sac. Transvaginal technique was performed to assess early pregnancy. Color and duplex Doppler ultrasound was utilized to evaluate blood flow to the ovaries. COMPARISON:  Pelvic ultrasound from 1 day prior. FINDINGS: A moderate amount of hypoechoic complex fluid is now seen within the cul-de-sac and pelvis, new since yesterday's exam and concerning for hemorrhage. An intrauterine pregnancy is not identified. Likewise an ectopic pregnancy is not definitively identified but must be assumed given the presence of complex free fluid within the pelvis. Patient reportedly was in severe pain per discussion with the ultrasound technologist. Intrauterine gestational sac: None Yolk sac:  Not Visualized. Embryo:  Not Visualized. Cardiac Activity: Not Visualized. Heart Rate: Not applicable Subchorionic hemorrhage:  None visualized. Maternal uterus/adnexae: Retroverted uterus. Right ovary visualized measuring 3.2 x 1.9 x 2 cm. Left ovary visualized measuring 3.4 x 1.7 x 1.7 cm. Corpus luteal cyst is suggested of the left ovary measuring 1.4 x 1.2 x 1.4 cm. Pulsed Doppler evaluation of both ovaries demonstrates normal appearing low-resistance arterial and venous waveforms. IMPRESSION: 1. New moderate complex free fluid within the pelvis concerning for hemorrhage. Given positive pregnancy test and lack of intrauterine gestation, ruptured ectopic is the leading consideration for the hypoechoic complex fluid. Critical Value/emergent results were called by telephone at the time of interpretation on 05/31/2017 at 10:06 pm to PA Sharen Hecklaudia Gibbons who verbally acknowledged these results. 2. No ovarian torsion. Electronically Signed   By: Tollie Ethavid  Kwon M.D.   On:  05/31/2017 22:07      MAU Course  Procedures None  MDM Reviewed labs and [place] from [facility]  Discussed patient with Dr. Despina Hidden. Admit for observation tonight and repeat hCG and CBC in the morning at 0700. NPO.  Assessment and Plan  A: Abdominal pain in pregnancy, first trimester Pregnancy of unknown location Vaginal bleeding in pregnancy, first trimester  P: Admit for observation NPO tonight Repeat labs in the morning   Vonzella Nipple, PA-C 06/01/2017, 12:26 AM

## 2017-06-01 NOTE — Discharge Instructions (Signed)
Abdominal Pain During Pregnancy Belly (abdominal) pain is common during pregnancy. Most of the time, it is not a serious problem. Other times, it can be a sign that something is wrong with the pregnancy. Always tell your doctor if you have belly pain. Follow these instructions at home: Monitor your belly pain for any changes. The following actions may help you feel better:  Do not have sex (intercourse) or put anything in your vagina until you feel better.  Rest until your pain stops.  Drink clear fluids if you feel sick to your stomach (nauseous). Do not eat solid food until you feel better.  Only take medicine as told by your doctor.  Keep all doctor visits as told.  Get help right away if:  You are bleeding, leaking fluid, or pieces of tissue come out of your vagina.  You have more pain or cramping.  You keep throwing up (vomiting).  You have pain when you pee (urinate) or have blood in your pee.  You have a fever.  You do not feel your baby moving as much.  You feel very weak or feel like passing out.  You have trouble breathing, with or without belly pain.  You have a very bad headache and belly pain.  You have fluid leaking from your vagina and belly pain.  You keep having watery poop (diarrhea).  Your belly pain does not go away after resting, or the pain gets worse. This information is not intended to replace advice given to you by your health care provider. Make sure you discuss any questions you have with your health care provider. Document Released: 05/02/2009 Document Revised: 12/21/2015 Document Reviewed: 12/11/2012 Elsevier Interactive Patient Education  2018 [employer].  Vaginal Bleeding During Pregnancy, First Trimester A small amount of bleeding (spotting) from the vagina is common in early pregnancy. Sometimes the bleeding is normal and is not a problem, and sometimes it is a sign of something serious. Be sure to tell your doctor about any bleeding  from your vagina right away. Follow these instructions at home:  Watch your condition for any changes.  Follow your doctor's instructions about how active you can be.  If you are on bed rest: ? You may need to stay in bed and only get up to use the bathroom. ? You may be allowed to do some activities. ? If you need help, make plans for someone to help you.  Write down: ? The number of pads you use each day. ? How often you change pads. ? How soaked (saturated) your pads are.  Do not use tampons.  Do not douche.  Do not have sex or orgasms until your doctor says it is okay.  If you pass any tissue from your vagina, save the tissue so you can show it to your doctor.  Only take medicines as told by your doctor.  Do not take aspirin because it can make you bleed.  Keep all follow-up visits as told by your doctor. Contact a doctor if:  You bleed from your vagina.  You have cramps.  You have labor pains.  You have a fever that does not go away after you take medicine. Get help right away if:  You have very bad cramps in your back or belly (abdomen).  You pass large clots or tissue from your vagina.  You bleed more.  You feel light-headed or weak.  You pass out (faint).  You have chills.  You are leaking fluid or  have a gush of fluid from your vagina.  You pass out while pooping (having a bowel movement). This information is not intended to replace advice given to you by your health care provider. Make sure you discuss any questions you have with your health care provider. Document Released: 09/28/2013 Document Revised: 10/20/2015 Document Reviewed: 01/19/2013 Elsevier Interactive Patient Education  [employer]2018 [employer] Inc.

## 2017-06-02 ENCOUNTER — Other Ambulatory Visit: Payer: Self-pay

## 2017-06-02 ENCOUNTER — Inpatient Hospital Stay (HOSPITAL_COMMUNITY): Payer: Medicaid Other

## 2017-06-02 ENCOUNTER — Encounter (HOSPITAL_COMMUNITY): Payer: Self-pay | Admitting: Emergency Medicine

## 2017-06-02 ENCOUNTER — Inpatient Hospital Stay (HOSPITAL_COMMUNITY)
Admission: AD | Admit: 2017-06-02 | Discharge: 2017-06-02 | Disposition: A | Discharge status: Home / Self Care | Payer: Medicaid Other | Source: Ambulatory Visit | Attending: Obstetrics & Gynecology | Admitting: Obstetrics & Gynecology

## 2017-06-02 DIAGNOSIS — O209 Hemorrhage in early pregnancy, unspecified: Secondary | ICD-10-CM | POA: Diagnosis not present

## 2017-06-02 DIAGNOSIS — Z3A01 Less than 8 weeks gestation of pregnancy: Secondary | ICD-10-CM | POA: Diagnosis not present

## 2017-06-02 DIAGNOSIS — Z6790 Unspecified blood type, Rh positive: Secondary | ICD-10-CM | POA: Diagnosis not present

## 2017-06-02 DIAGNOSIS — Z886 Allergy status to analgesic agent status: Secondary | ICD-10-CM | POA: Diagnosis not present

## 2017-06-02 DIAGNOSIS — O3680X0 Pregnancy with inconclusive fetal viability, not applicable or unspecified: Secondary | ICD-10-CM

## 2017-06-02 LAB — URINALYSIS, ROUTINE W REFLEX MICROSCOPIC
Bacteria, UA: NONE SEEN
Bilirubin Urine: NEGATIVE
Glucose, UA: 50 mg/dL — AB
KETONES UR: 20 mg/dL — AB
Nitrite: NEGATIVE
Protein, ur: 100 mg/dL — AB
SQUAMOUS EPITHELIAL / LPF: NONE SEEN
Specific Gravity, Urine: 1.02 (ref 1.005–1.030)
pH: 6 (ref 5.0–8.0)

## 2017-06-02 LAB — CBC
HCT: 32.6 % — ABNORMAL LOW (ref 36.0–46.0)
HEMOGLOBIN: 10.8 g/dL — AB (ref 12.0–15.0)
MCH: 27.4 pg (ref 26.0–34.0)
MCHC: 33.1 g/dL (ref 30.0–36.0)
MCV: 82.7 fL (ref 78.0–100.0)
PLATELETS: 214 10*3/uL (ref 150–400)
RBC: 3.94 MIL/uL (ref 3.87–5.11)
RDW: 13.7 % (ref 11.5–15.5)
WBC: 9.4 10*3/uL (ref 4.0–10.5)

## 2017-06-02 LAB — HCG, QUANTITATIVE, PREGNANCY: hCG, Beta Chain, Quant, S: 706 m[IU]/mL — ABNORMAL HIGH (ref <5)

## 2017-06-02 NOTE — Discharge Instructions (Signed)
Vaginal Bleeding During Pregnancy, First Trimester °A small amount of bleeding (spotting) from the vagina is common in early pregnancy. Sometimes the bleeding is normal and is not a problem, and sometimes it is a sign of something serious. Be sure to tell your doctor about any bleeding from your vagina right away. °Follow these instructions at home: °· Watch your condition for any changes. °· Follow your doctor's instructions about how active you can be. °· If you are on bed rest: °? You may need to stay in bed and only get up to use the bathroom. °? You may be allowed to do some activities. °? If you need help, make plans for someone to help you. °· Write down: °? The number of pads you use each day. °? How often you change pads. °? How soaked (saturated) your pads are. °· Do not use tampons. °· Do not douche. °· Do not have sex or orgasms until your doctor says it is okay. °· If you pass any tissue from your vagina, save the tissue so you can show it to your doctor. °· Only take medicines as told by your doctor. °· Do not take aspirin because it can make you bleed. °· Keep all follow-up visits as told by your doctor. °Contact a doctor if: °· You bleed from your vagina. °· You have cramps. °· You have labor pains. °· You have a fever that does not go away after you take medicine. °Get help right away if: °· You have very bad cramps in your back or belly (abdomen). °· You pass large clots or tissue from your vagina. °· You bleed more. °· You feel light-headed or weak. °· You pass out (faint). °· You have chills. °· You are leaking fluid or have a gush of fluid from your vagina. °· You pass out while pooping (having a bowel movement). °This information is not intended to replace advice given to you by your health care provider. Make sure you discuss any questions you have with your health care provider. °Document Released: 09/28/2013 Document Revised: 10/20/2015 Document Reviewed: 01/19/2013 °Elsevier Interactive  Patient Education © 2018 Elsevier Inc. ° °

## 2017-06-02 NOTE — MAU Provider Note (Signed)
Chief Complaint: Follow-up   First Provider Initiated Contact with Patient 06/02/17 1844      SUBJECTIVE HPI: Lori Baldwin is a 22 y.o. G2P1001 at [redacted]w[redacted]d by LMP who presents to maternity admissions reporting onset of bright red bleeding when urinating today.  She reports cramping lower abdominal pain, bilaterally, but more on the right side starting today, after bleeding started. There are no other associated symptoms. The bleeding is intermittent, only when wiping after urinating, and has not required a pad.  The pain is intermittent as well, mild but persistent, unchanged since onset before arrival in MAU.  She is concerned she is having a miscarriage.  She was seen initially on 05/30/17 in MAU and had quant hcg of 333 and [place] with no IUP or ectopic visualized.  On 05/31/17, pt presented to [facility] and had US showing complex fluid in pelvis and was transferred to [facility] and observed overnight.  Per Dr Despina Hidden, fluid in abdomen more c/w hemorrhagic cyst than ruptured ectopic.  Hcg on 1/4 was 544 at [facility], then was 445 at [facility] on 06/01/17.   Pt discharged home with plan to repeat hcg in 48 hours.  She returns today with an increase in amount of bleeding. She denies vaginal itching/burning, urinary symptoms, h/a, dizziness, n/v, or fever/chills.     HPI  Past Medical History:  Diagnosis Date  . Anemia   . BV (bacterial vaginosis)   . Gonorrhea   . Headache   . Pharyngitis   . Yeast vaginitis    Past Surgical History:  Procedure Laterality Date  . CESAREAN SECTION     Social History   Socioeconomic History  . Marital status: Single    Spouse name: Not on file  . Number of children: Not on file  . Years of education: Not on file  . Highest education level: Not on file  Social Needs  . Financial resource strain: Not on file  . Food insecurity - worry: Not on file  . Food insecurity - inability: Not on file  . Transportation needs - medical: Not on file  . Transportation  needs - non-medical: Not on file  Occupational History  . Not on file  Tobacco Use  . Smoking status: Never Smoker  . Smokeless tobacco: Never Used  Substance and Sexual Activity  . Alcohol use: No  . Drug use: Yes    Types: Marijuana    Comment: last was early Dec  . Sexual activity: Yes    Birth control/protection: None  Other Topics Concern  . Not on file  Social History Narrative  . Not on file   No current facility-administered medications on file prior to encounter.    Current Outpatient Medications on File Prior to Encounter  Medication Sig Dispense Refill  . fluconazole (DIFLUCAN) 150 MG tablet Take 1 tablet (150 mg total) by mouth daily. Take second dose 72 hours later if symptoms still persists. (Patient not taking: Reported on 05/31/2017) 2 tablet 0   Allergies  Allergen Reactions  . Aspirin Itching    Pt states she does fine when she takes ibuprofen    . Tylenol [Acetaminophen] Itching    ROS:  Review of Systems  Constitutional: Negative for chills, fatigue and fever.  Respiratory: Negative for shortness of breath.   Cardiovascular: Negative for chest pain.  Gastrointestinal: Positive for abdominal pain.  Genitourinary: Positive for pelvic pain and vaginal bleeding. Negative for difficulty urinating, dysuria, flank pain, vaginal discharge and vaginal pain.  Neurological: Negative for dizziness and headaches.  Psychiatric/Behavioral: Negative.      I have reviewed patient's Past Medical Hx, Surgical Hx, Family Hx, Social Hx, medications and allergies.   Physical Exam   Patient Vitals for the past 24 hrs:  BP Temp Temp src Pulse Resp SpO2 Weight  06/02/17 2005 97/63 99.4 F (37.4 C) Oral 74 16 - -  06/02/17 1658 102/62 98.8 F (37.1 C) Oral 84 16 100 % 99 lb 12 oz (45.2 kg)   Constitutional: Well-developed, well-nourished female in no acute distress.  Cardiovascular: normal rate Respiratory: normal effort GI: Abd soft, non-tender. Pos BS x 4 MS:  Extremities nontender, no edema, normal ROM Neurologic: Alert and oriented x 4.  GU: Neg CVAT.  PELVIC EXAM: Deferred  Pad count, with scant spotting on pad x 1-2 hours in MAU.   LAB RESULTS Results for orders placed or performed during the hospital encounter of 06/02/17 (from the past 24 hour(s))  hCG, quantitative, pregnancy     Status: Abnormal   Collection Time: 06/02/17  4:43 PM  Result Value Ref Range   hCG, Beta Chain, Quant, S 706 (H) <5 mIU/mL  CBC     Status: Abnormal   Collection Time: 06/02/17  4:47 PM  Result Value Ref Range   WBC 9.4 4.0 - 10.5 K/uL   RBC 3.94 3.87 - 5.11 MIL/uL   Hemoglobin 10.8 (L) 12.0 - 15.0 g/dL   HCT 78.232.6 (L) 95.636.0 - 21.346.0 %   MCV 82.7 78.0 - 100.0 fL   MCH 27.4 26.0 - 34.0 pg   MCHC 33.1 30.0 - 36.0 g/dL   RDW 08.613.7 57.811.5 - 46.915.5 %   Platelets 214 150 - 400 K/uL  Urinalysis, Routine w reflex microscopic     Status: Abnormal   Collection Time: 06/02/17  7:55 PM  Result Value Ref Range   Color, Urine YELLOW YELLOW   APPearance TURBID (A) CLEAR   Specific Gravity, Urine 1.020 1.005 - 1.030   pH 6.0 5.0 - 8.0   Glucose, UA 50 (A) NEGATIVE mg/dL   Hgb urine dipstick LARGE (A) NEGATIVE   Bilirubin Urine NEGATIVE NEGATIVE   Ketones, ur 20 (A) NEGATIVE mg/dL   Protein, ur 629100 (A) NEGATIVE mg/dL   Nitrite NEGATIVE NEGATIVE   Leukocytes, UA TRACE (A) NEGATIVE   RBC / HPF TOO NUMEROUS TO COUNT 0 - 5 RBC/hpf   WBC, UA TOO NUMEROUS TO COUNT 0 - 5 WBC/hpf   Bacteria, UA NONE SEEN NONE SEEN   Squamous Epithelial / LPF NONE SEEN NONE SEEN   Non Squamous Epithelial 0-5 (A) NONE SEEN    --/--/A POS (01/05 0121)  IMAGING [place] Ob Comp Less 14 Wks  Result Date: 05/30/2017 CLINICAL DATA:  Pregnant patient with cramping. EXAM: OBSTETRIC <14 WK US AND TRANSVAGINAL OB US TECHNIQUE: Both transabdominal and transvaginal ultrasound examinations were performed for complete evaluation of the gestation as well as the maternal uterus, adnexal regions, and pelvic  cul-de-sac. Transvaginal technique was performed to assess early pregnancy. COMPARISON:  Pelvic ultrasound 08/17/2011 FINDINGS: Intrauterine gestational sac: None Yolk sac:  Not Visualized. Embryo:  Not Visualized. Cardiac Activity: Not Visualized. Maternal uterus/adnexae: Normal right and left ovaries. No free fluid in the pelvis. Small amount of debris within the endometrial canal. IMPRESSION: No intrauterine gestation identified. In the setting of positive pregnancy test and no definite intrauterine pregnancy, this reflects a pregnancy of unknown location. Differential considerations include early normal IUP, abnormal IUP, or nonvisualized ectopic pregnancy. Differentiation  is achieved with serial beta HCG supplemented by repeat sonography as clinically warranted. Small amount of debris in the endometrial canal. Recommend attention on follow-up exam. Electronically Signed   By: Annia Belt M.D.   On: 05/30/2017 15:22   US Ob Transvaginal  Result Date: 06/02/2017 CLINICAL DATA:  Vaginal bleeding in the first trimester pregnancy. Rising beta HCG levels, now 706 versus 544 on 05/31/2017. EXAM: TRANSVAGINAL OB ULTRASOUND TECHNIQUE: Transvaginal ultrasound was performed for complete evaluation of the gestation as well as the maternal uterus, adnexal regions, and pelvic cul-de-sac. COMPARISON:  05/31/2017 FINDINGS: Intrauterine gestational sac: None Yolk sac:  Not Visualized. Embryo:  Not Visualized. Cardiac Activity: Not Visualized. Heart Rate: Not applicable Free fluid: Interval subjective decrease in small to moderate complex free pelvic fluid since prior. Subchorionic hemorrhage:  None visualized. Maternal uterus/adnexae: Uterus is retroverted. Small corpus luteal cyst is noted of the right ovary measuring 1.3 x 1.1 x 1.2 cm. New 1.5 x 1 x 1.2 cm complex masslike abnormality inferior and medial to the right ovary with peripheral vascularity (images 32-43) raise concern for findings of an ectopic pregnancy not  apparent on prior earlier exams. Clot like material is seen superolateral to the right ovary, images 44-46. IMPRESSION: 1. New 1.5 x 1 x 1.2 cm right adnexal mass adjacent to the right ovary. This, in conjunction with a rising beta HCG levels and previously noted complex free fluid, albeit decreased subjectively in appearance within the pelvis are suspicious for the suspected ectopic pregnancy. These results will be called to the ordering clinician or representative by the Radiologist Assistant, and communication documented in the PACS or zVision Dashboard. 2. No intrauterine gestation identified. Electronically Signed   By: Tollie Eth M.D.   On: 06/02/2017 20:11   US Ob Transvaginal  Result Date: 05/30/2017 CLINICAL DATA:  Pregnant patient with cramping. EXAM: OBSTETRIC <14 WK [place] AND TRANSVAGINAL OB US TECHNIQUE: Both transabdominal and transvaginal ultrasound examinations were performed for complete evaluation of the gestation as well as the maternal uterus, adnexal regions, and pelvic cul-de-sac. Transvaginal technique was performed to assess early pregnancy. COMPARISON:  Pelvic ultrasound 08/17/2011 FINDINGS: Intrauterine gestational sac: None Yolk sac:  Not Visualized. Embryo:  Not Visualized. Cardiac Activity: Not Visualized. Maternal uterus/adnexae: Normal right and left ovaries. No free fluid in the pelvis. Small amount of debris within the endometrial canal. IMPRESSION: No intrauterine gestation identified. In the setting of positive pregnancy test and no definite intrauterine pregnancy, this reflects a pregnancy of unknown location. Differential considerations include early normal IUP, abnormal IUP, or nonvisualized ectopic pregnancy. Differentiation is achieved with serial beta HCG supplemented by repeat sonography as clinically warranted. Small amount of debris in the endometrial canal. Recommend attention on follow-up exam. Electronically Signed   By: Annia Belt M.D.   On: 05/30/2017 15:22   US  Pelvic Doppler (torsion R/o Or Mass Arterial Flow)  Result Date: 05/31/2017 CLINICAL DATA:  Abdominal pain with positive pregnancy test. Rising beta HCG to 544 today versus 333 yesterday. EXAM: OBSTETRIC <14 WK [place] AND TRANSVAGINAL OB US DOPPLER ULTRASOUND OF OVARIES TECHNIQUE: Both transabdominal and transvaginal ultrasound examinations were performed for complete evaluation of the gestation as well as the maternal uterus, adnexal regions, and pelvic cul-de-sac. Transvaginal technique was performed to assess early pregnancy. Color and duplex Doppler ultrasound was utilized to evaluate blood flow to the ovaries. COMPARISON:  Pelvic ultrasound from 1 day prior. FINDINGS: A moderate amount of hypoechoic complex fluid is now seen within the cul-de-sac and pelvis,  new since yesterday's exam and concerning for hemorrhage. An intrauterine pregnancy is not identified. Likewise an ectopic pregnancy is not definitively identified but must be assumed given the presence of complex free fluid within the pelvis. Patient reportedly was in severe pain per discussion with the ultrasound technologist. Intrauterine gestational sac: None Yolk sac:  Not Visualized. Embryo:  Not Visualized. Cardiac Activity: Not Visualized. Heart Rate: Not applicable Subchorionic hemorrhage:  None visualized. Maternal uterus/adnexae: Retroverted uterus. Right ovary visualized measuring 3.2 x 1.9 x 2 cm. Left ovary visualized measuring 3.4 x 1.7 x 1.7 cm. Corpus luteal cyst is suggested of the left ovary measuring 1.4 x 1.2 x 1.4 cm. Pulsed Doppler evaluation of both ovaries demonstrates normal appearing low-resistance arterial and venous waveforms. IMPRESSION: 1. New moderate complex free fluid within the pelvis concerning for hemorrhage. Given positive pregnancy test and lack of intrauterine gestation, ruptured ectopic is the leading consideration for the hypoechoic complex fluid. Critical Value/emergent results were called by telephone at the time of  interpretation on 05/31/2017 at 10:06 pm to PA Sharen Heck who verbally acknowledged these results. 2. No ovarian torsion. Electronically Signed   By: Tollie Eth M.D.   On: 05/31/2017 22:07   US Ob Less Than 14 Weeks With Ob Transvaginal  Result Date: 05/31/2017 CLINICAL DATA:  Abdominal pain with positive pregnancy test. Rising beta HCG to 544 today versus 333 yesterday. EXAM: OBSTETRIC <14 WK [place] AND TRANSVAGINAL OB US DOPPLER ULTRASOUND OF OVARIES TECHNIQUE: Both transabdominal and transvaginal ultrasound examinations were performed for complete evaluation of the gestation as well as the maternal uterus, adnexal regions, and pelvic cul-de-sac. Transvaginal technique was performed to assess early pregnancy. Color and duplex Doppler ultrasound was utilized to evaluate blood flow to the ovaries. COMPARISON:  Pelvic ultrasound from 1 day prior. FINDINGS: A moderate amount of hypoechoic complex fluid is now seen within the cul-de-sac and pelvis, new since yesterday's exam and concerning for hemorrhage. An intrauterine pregnancy is not identified. Likewise an ectopic pregnancy is not definitively identified but must be assumed given the presence of complex free fluid within the pelvis. Patient reportedly was in severe pain per discussion with the ultrasound technologist. Intrauterine gestational sac: None Yolk sac:  Not Visualized. Embryo:  Not Visualized. Cardiac Activity: Not Visualized. Heart Rate: Not applicable Subchorionic hemorrhage:  None visualized. Maternal uterus/adnexae: Retroverted uterus. Right ovary visualized measuring 3.2 x 1.9 x 2 cm. Left ovary visualized measuring 3.4 x 1.7 x 1.7 cm. Corpus luteal cyst is suggested of the left ovary measuring 1.4 x 1.2 x 1.4 cm. Pulsed Doppler evaluation of both ovaries demonstrates normal appearing low-resistance arterial and venous waveforms. IMPRESSION: 1. New moderate complex free fluid within the pelvis concerning for hemorrhage. Given positive pregnancy  test and lack of intrauterine gestation, ruptured ectopic is the leading consideration for the hypoechoic complex fluid. Critical Value/emergent results were called by telephone at the time of interpretation on 05/31/2017 at 10:06 pm to PA Sharen Heck who verbally acknowledged these results. 2. No ovarian torsion. Electronically Signed   By: Tollie Eth M.D.   On: 05/31/2017 22:07    MAU Management/MDM: Ordered labs and reviewed results.  Quant with appropriate rise for 24 hours but pt pain the same as yesterday at discharge and her bleeding has increased.  Ordered transvaginal OB US.  UA ordered and results pending.    Report to Donette Larry, CNM, with [place] and UA pending  Sharen Counter Certified Nurse-Midwife 06/02/2017  8:56 PM  Consult with Dr. Despina Hidden  who reviewed labs and images. With rising quant an early IUP cannot be r/o as well as failed pregnancy or ectopic at this point. Will follow quant in 48 hrs and evaluate. Ok to consult with Dr. Despina Hidden at f/u visit on 06/05/16. Hgb stable. Progesterone ordered. Stable for discharge home.  A/P: 1. Pregnancy of unknown anatomic location   2. Vaginal bleeding in pregnancy, first trimester   3. Blood type, Rh positive    Discharge home Follow up in MAU on 06/05/16- am for labs Return/bleeding precautions  Allergies as of 06/02/2017      Reactions   Aspirin Itching   Pt states she does fine when she takes ibuprofen    Tylenol [acetaminophen] Itching      Medication List    STOP taking these medications   fluconazole 150 MG tablet Commonly known as:  DIFLUCAN     TAKE these medications   prenatal multivitamin Tabs tablet Take 1 tablet by mouth daily at 12 noon.      Donette Larry, CNM  06/02/2017 9:03 PM

## 2017-06-02 NOTE — MAU Note (Addendum)
Here for follow up.  Bleeding now is like a period, started this morninig. Having pain in RLQ and in her private area, started about 20 min ago.

## 2017-06-03 ENCOUNTER — Ambulatory Visit: Payer: Medicaid Other

## 2017-06-03 ENCOUNTER — Telehealth: Payer: Self-pay | Admitting: General Practice

## 2017-06-03 NOTE — Telephone Encounter (Signed)
Patient no showed for stat bhcg today. Per Dr Adrian BlackwaterStinson, patient should come in tomorrow for this. Called patient, no answer- left message on voicemail stating we are calling as you missed your scheduled appt in our office today for blood work. Please call our front office tomorrow to get this appointment rescheduled for tomorrow. Will send mychart message

## 2017-06-04 LAB — PROGESTERONE: PROGESTERONE: 3 ng/mL

## 2017-06-05 ENCOUNTER — Inpatient Hospital Stay (HOSPITAL_COMMUNITY)
Admission: AD | Admit: 2017-06-05 | Discharge: 2017-06-05 | Disposition: A | Discharge status: Home / Self Care | Payer: Medicaid Other | Source: Ambulatory Visit | Attending: Obstetrics and Gynecology | Admitting: Obstetrics and Gynecology

## 2017-06-05 ENCOUNTER — Other Ambulatory Visit: Payer: Self-pay

## 2017-06-05 ENCOUNTER — Inpatient Hospital Stay (HOSPITAL_COMMUNITY): Payer: Medicaid Other

## 2017-06-05 DIAGNOSIS — R109 Unspecified abdominal pain: Secondary | ICD-10-CM | POA: Insufficient documentation

## 2017-06-05 DIAGNOSIS — N760 Acute vaginitis: Secondary | ICD-10-CM | POA: Insufficient documentation

## 2017-06-05 DIAGNOSIS — O00101 Right tubal pregnancy without intrauterine pregnancy: Secondary | ICD-10-CM

## 2017-06-05 DIAGNOSIS — O26891 Other specified pregnancy related conditions, first trimester: Secondary | ICD-10-CM | POA: Insufficient documentation

## 2017-06-05 DIAGNOSIS — O99511 Diseases of the respiratory system complicating pregnancy, first trimester: Secondary | ICD-10-CM | POA: Diagnosis not present

## 2017-06-05 DIAGNOSIS — O3481 Maternal care for other abnormalities of pelvic organs, first trimester: Secondary | ICD-10-CM | POA: Diagnosis not present

## 2017-06-05 DIAGNOSIS — O0281 Inappropriate change in quantitative human chorionic gonadotropin (hCG) in early pregnancy: Secondary | ICD-10-CM

## 2017-06-05 DIAGNOSIS — B9689 Other specified bacterial agents as the cause of diseases classified elsewhere: Secondary | ICD-10-CM | POA: Diagnosis not present

## 2017-06-05 DIAGNOSIS — O00109 Unspecified tubal pregnancy without intrauterine pregnancy: Secondary | ICD-10-CM | POA: Insufficient documentation

## 2017-06-05 DIAGNOSIS — Z3A00 Weeks of gestation of pregnancy not specified: Secondary | ICD-10-CM | POA: Diagnosis not present

## 2017-06-05 DIAGNOSIS — J029 Acute pharyngitis, unspecified: Secondary | ICD-10-CM | POA: Insufficient documentation

## 2017-06-05 DIAGNOSIS — O99011 Anemia complicating pregnancy, first trimester: Secondary | ICD-10-CM | POA: Insufficient documentation

## 2017-06-05 DIAGNOSIS — O23591 Infection of other part of genital tract in pregnancy, first trimester: Secondary | ICD-10-CM | POA: Diagnosis not present

## 2017-06-05 DIAGNOSIS — N839 Noninflammatory disorder of ovary, fallopian tube and broad ligament, unspecified: Secondary | ICD-10-CM | POA: Diagnosis not present

## 2017-06-05 LAB — COMPREHENSIVE METABOLIC PANEL
ALK PHOS: 46 U/L (ref 38–126)
ALT: 15 U/L (ref 14–54)
AST: 18 U/L (ref 15–41)
Albumin: 4.2 g/dL (ref 3.5–5.0)
Anion gap: 8 (ref 5–15)
BUN: 12 mg/dL (ref 6–20)
CALCIUM: 9.2 mg/dL (ref 8.9–10.3)
CO2: 23 mmol/L (ref 22–32)
CREATININE: 0.56 mg/dL (ref 0.44–1.00)
Chloride: 105 mmol/L (ref 101–111)
Glucose, Bld: 93 mg/dL (ref 65–99)
Potassium: 3.7 mmol/L (ref 3.5–5.1)
SODIUM: 136 mmol/L (ref 135–145)
Total Bilirubin: 0.6 mg/dL (ref 0.3–1.2)
Total Protein: 8.1 g/dL (ref 6.5–8.1)

## 2017-06-05 LAB — CBC
HCT: 34.6 % — ABNORMAL LOW (ref 36.0–46.0)
Hemoglobin: 11.5 g/dL — ABNORMAL LOW (ref 12.0–15.0)
MCH: 27.1 pg (ref 26.0–34.0)
MCHC: 33.2 g/dL (ref 30.0–36.0)
MCV: 81.4 fL (ref 78.0–100.0)
PLATELETS: 240 10*3/uL (ref 150–400)
RBC: 4.25 MIL/uL (ref 3.87–5.11)
RDW: 13.7 % (ref 11.5–15.5)
WBC: 8.7 10*3/uL (ref 4.0–10.5)

## 2017-06-05 LAB — HCG, QUANTITATIVE, PREGNANCY: hCG, Beta Chain, Quant, S: 857 m[IU]/mL — ABNORMAL HIGH (ref <5)

## 2017-06-05 MED ORDER — METHOTREXATE INJECTION FOR WOMEN'S HOSPITAL
50.0000 mg/m2 | Freq: Once | INTRAMUSCULAR | Status: AC
  Administered 2017-06-05: 70 mg via INTRAMUSCULAR
  Filled 2017-06-05: qty 1.4

## 2017-06-05 MED ORDER — FLUCONAZOLE 150 MG PO TABS: 150.0000 mg | ORAL_TABLET | Freq: Once | ORAL | 0 refills | Status: AC

## 2017-06-05 MED ORDER — PROMETHAZINE HCL 25 MG PO TABS: 12.5000 mg | ORAL_TABLET | Freq: Four times a day (QID) | ORAL | 0 refills | Status: DC | PRN

## 2017-06-05 MED ORDER — OXYCODONE HCL 5 MG PO TABS: 5.0000 mg | ORAL_TABLET | Freq: Four times a day (QID) | ORAL | 0 refills | Status: DC | PRN

## 2017-06-05 NOTE — MAU Note (Signed)
At 1705, informed by CNM that pt has gone to pick up child and will be returning for injection of MTX

## 2017-06-05 NOTE — MAU Note (Signed)
Here for follow up. States is doing ok.  Bleeding stopped yesterday. No pain, just mild menstrual cramps.  No appetite, reports is losing weight.

## 2017-06-05 NOTE — MAU Provider Note (Signed)
Chief Complaint: Follow-up   First Provider Initiated Contact with Patient 06/05/17 1211      SUBJECTIVE HPI: Lori Baldwin is a 22 y.o. G2P1001 at [redacted]w[redacted]d by LMP who presents to maternity admissions for scheduled repeat hcg.  She initially presented to MAU on 05/30/17 with abdominal cramping intermittent pain not requiring treatment. She represented to [facility] on 05/31/17 with an increase in pain and onset of vaginal bleeding. Her lab/US results are as follows:   05/30/17 MAU hcg 333 [place] no IUP or ectopic visualized  05/31/17 at [facility] hcg 544 [place] complex fluid in pelvis, possible ruptured ectopic pregnancy Transferred to MAU same date, imaging reviewed by Dr Despina Hidden who felt like hemorrhagic cyst more likely so observed overnight and discharged with close follow up hcg at [facility] on 06/01/17 was 445  06/02/17 MAU hcg 706 Progesterone 3.0 US showed new 1.5 x 1 x 1.2 right adnexal mass, subjectively decreased fluid in pelvis, no IUP  Dr Despina Hidden was attending on 05/31/17 and evaluated pt images and hcg levels and admitted pt to [facility] overnight for observation. The following morning, the pt continued to be stable so was discharged home with plan for hcg in 48 hours.  She returned to MAU in less than 48 hours because she had an increase in vaginal bleeding. At that time she had an appropriate rise in hcg but a new small mass in her right adnexa.  Dr Despina Hidden was attending at this presentation as well, reviewed the entire presentation from her first MAU visit, and felt like the pt was stable to observe closely with hcg levels in 48 hours.  She returns to day for her scheduled follow up and denies any new symptoms, only her mild menstrual-like cramping continues unchanged and not requiring treatment. She denies any vaginal bleeding, dizziness, n/v, or fever/chills.   HPI  Past Medical History:  Diagnosis Date  . Anemia   . BV (bacterial vaginosis)   . Gonorrhea   . Headache   . Pharyngitis   .  Yeast vaginitis    Past Surgical History:  Procedure Laterality Date  . CESAREAN SECTION     Social History   Socioeconomic History  . Marital status: Single    Spouse name: Not on file  . Number of children: Not on file  . Years of education: Not on file  . Highest education level: Not on file  Social Needs  . Financial resource strain: Not on file  . Food insecurity - worry: Not on file  . Food insecurity - inability: Not on file  . Transportation needs - medical: Not on file  . Transportation needs - non-medical: Not on file  Occupational History  . Not on file  Tobacco Use  . Smoking status: Never Smoker  . Smokeless tobacco: Never Used  Substance and Sexual Activity  . Alcohol use: No  . Drug use: Yes    Types: Marijuana    Comment: last was early Dec  . Sexual activity: Yes    Birth control/protection: None  Other Topics Concern  . Not on file  Social History Narrative  . Not on file   No current facility-administered medications on file prior to encounter.    Current Outpatient Medications on File Prior to Encounter  Medication Sig Dispense Refill  . Prenatal Vit-Fe Fumarate-FA (PRENATAL MULTIVITAMIN) TABS tablet Take 1 tablet by mouth daily at 12 noon. 30 tablet 11   Allergies  Allergen Reactions  . Aspirin Itching  Pt states she does fine when she takes ibuprofen    . Tylenol [Acetaminophen] Itching    ROS:  Review of Systems  Constitutional: Negative for chills, fatigue and fever.  Respiratory: Negative for shortness of breath.   Cardiovascular: Negative for chest pain.  Gastrointestinal: Positive for abdominal pain. Negative for abdominal distention, nausea and vomiting.  Genitourinary: Positive for pelvic pain. Negative for difficulty urinating, dysuria, flank pain, vaginal bleeding, vaginal discharge and vaginal pain.  Neurological: Negative for dizziness and headaches.  Psychiatric/Behavioral: Negative.      I have reviewed patient's Past  Medical Hx, Surgical Hx, Family Hx, Social Hx, medications and allergies.   Physical Exam   Patient Vitals for the past 24 hrs:  BP Temp Pulse Resp SpO2 Height Weight  06/05/17 2049 108/65 - 85 - - - -  06/05/17 1204 (!) 105/59 99.2 F (37.3 C) 81 16 100 % 5' (1.524 m) 97 lb (44 kg)   Constitutional: Well-developed, well-nourished female in no acute distress.  Cardiovascular: normal rate Respiratory: normal effort GI: Abd soft, non-tender. Pos BS x 4 MS: Extremities nontender, no edema, normal ROM Neurologic: Alert and oriented x 4.  GU: Neg CVAT.  PELVIC EXAM: Deferred  LAB RESULTS Results for orders placed or performed during the hospital encounter of 06/05/17 (from the past 24 hour(s))  hCG, quantitative, pregnancy     Status: Abnormal   Collection Time: 06/05/17 10:44 AM  Result Value Ref Range   hCG, Beta Chain, Quant, S 857 (H) <5 mIU/mL  Comprehensive metabolic panel     Status: None   Collection Time: 06/05/17 12:19 PM  Result Value Ref Range   Sodium 136 135 - 145 mmol/L   Potassium 3.7 3.5 - 5.1 mmol/L   Chloride 105 101 - 111 mmol/L   CO2 23 22 - 32 mmol/L   Glucose, Bld 93 65 - 99 mg/dL   BUN 12 6 - 20 mg/dL   Creatinine, Ser 3.08 0.44 - 1.00 mg/dL   Calcium 9.2 8.9 - 65.7 mg/dL   Total Protein 8.1 6.5 - 8.1 g/dL   Albumin 4.2 3.5 - 5.0 g/dL   AST 18 15 - 41 U/L   ALT 15 14 - 54 U/L   Alkaline Phosphatase 46 38 - 126 U/L   Total Bilirubin 0.6 0.3 - 1.2 mg/dL   GFR calc non Af Amer >60 >60 mL/min   GFR calc Af Amer >60 >60 mL/min   Anion gap 8 5 - 15  CBC     Status: Abnormal   Collection Time: 06/05/17 12:19 PM  Result Value Ref Range   WBC 8.7 4.0 - 10.5 K/uL   RBC 4.25 3.87 - 5.11 MIL/uL   Hemoglobin 11.5 (L) 12.0 - 15.0 g/dL   HCT 84.6 (L) 96.2 - 95.2 %   MCV 81.4 78.0 - 100.0 fL   MCH 27.1 26.0 - 34.0 pg   MCHC 33.2 30.0 - 36.0 g/dL   RDW 84.1 32.4 - 40.1 %   Platelets 240 150 - 400 K/uL    --/--/A POS (01/05 0121)  IMAGING US Ob Comp  Less 14 Wks  Result Date: 05/30/2017 CLINICAL DATA:  Pregnant patient with cramping. EXAM: OBSTETRIC <14 WK [place] AND TRANSVAGINAL OB US TECHNIQUE: Both transabdominal and transvaginal ultrasound examinations were performed for complete evaluation of the gestation as well as the maternal uterus, adnexal regions, and pelvic cul-de-sac. Transvaginal technique was performed to assess early pregnancy. COMPARISON:  Pelvic ultrasound 08/17/2011 FINDINGS: Intrauterine gestational sac:  None Yolk sac:  Not Visualized. Embryo:  Not Visualized. Cardiac Activity: Not Visualized. Maternal uterus/adnexae: Normal right and left ovaries. No free fluid in the pelvis. Small amount of debris within the endometrial canal. IMPRESSION: No intrauterine gestation identified. In the setting of positive pregnancy test and no definite intrauterine pregnancy, this reflects a pregnancy of unknown location. Differential considerations include early normal IUP, abnormal IUP, or nonvisualized ectopic pregnancy. Differentiation is achieved with serial beta HCG supplemented by repeat sonography as clinically warranted. Small amount of debris in the endometrial canal. Recommend attention on follow-up exam. Electronically Signed   By: Annia Belt M.D.   On: 05/30/2017 15:22   US Ob Transvaginal  Addendum Date: 06/05/2017   ADDENDUM REPORT: 06/05/2017 15:06 ADDENDUM: No free fluid demonstrated on today's sonogram. Acute findings discussed with and reconfirmed by NP.Karlin Heilman LEFTWICH-KIRBY on 06/05/2017 at 3:03 pm. Electronically Signed   By: Awilda Metro M.D.   On: 06/05/2017 15:06   Result Date: 06/05/2017 CLINICAL DATA:  Follow-up RIGHT adnexal mass, possible ectopic pregnancy. Beta HCG 857, previously 706. Gestational age by last menstrual period 5 weeks and 4 days. EXAM: TRANSVAGINAL OB ULTRASOUND TECHNIQUE: Transvaginal ultrasound was performed for complete evaluation of the gestation as well as the maternal uterus, adnexal regions, and pelvic  cul-de-sac. COMPARISON:  Obstetric ultrasound June 03, 2007 FINDINGS: Intrauterine gestational sac: Not present; increased amount of fluid within the endometrium. Yolk sac:  Not present. Embryo:  Not present. Cardiac Activity: Not applicable. Subchorionic hemorrhage:  None visualized. Maternal uterus/adnexae: 1.7 x 1.6 x 1.5 cm complex RIGHT para ovarian mass with peripheral vascularity increased in size from prior imaging. IMPRESSION: Increased size of RIGHT para ovarian mass concerning for ectopic pregnancy. Please note, with beta HCG of less than 3,000, intrauterine pregnancy may not be sonographically evident though, heterotopic pregnancy is rare. Increasing volume fluid within the endometrium. Electronically Signed: By: Awilda Metro M.D. On: 06/05/2017 14:05   US Ob Transvaginal  Result Date: 06/02/2017 CLINICAL DATA:  Vaginal bleeding in the first trimester pregnancy. Rising beta HCG levels, now 706 versus 544 on 05/31/2017. EXAM: TRANSVAGINAL OB ULTRASOUND TECHNIQUE: Transvaginal ultrasound was performed for complete evaluation of the gestation as well as the maternal uterus, adnexal regions, and pelvic cul-de-sac. COMPARISON:  05/31/2017 FINDINGS: Intrauterine gestational sac: None Yolk sac:  Not Visualized. Embryo:  Not Visualized. Cardiac Activity: Not Visualized. Heart Rate: Not applicable Free fluid: Interval subjective decrease in small to moderate complex free pelvic fluid since prior. Subchorionic hemorrhage:  None visualized. Maternal uterus/adnexae: Uterus is retroverted. Small corpus luteal cyst is noted of the right ovary measuring 1.3 x 1.1 x 1.2 cm. New 1.5 x 1 x 1.2 cm complex masslike abnormality inferior and medial to the right ovary with peripheral vascularity (images 32-43) raise concern for findings of an ectopic pregnancy not apparent on prior earlier exams. Clot like material is seen superolateral to the right ovary, images 44-46. IMPRESSION: 1. New 1.5 x 1 x 1.2 cm right  adnexal mass adjacent to the right ovary. This, in conjunction with a rising beta HCG levels and previously noted complex free fluid, albeit decreased subjectively in appearance within the pelvis are suspicious for the suspected ectopic pregnancy. These results will be called to the ordering clinician or representative by the Radiologist Assistant, and communication documented in the PACS or zVision Dashboard. 2. No intrauterine gestation identified. Electronically Signed   By: Tollie Eth M.D.   On: 06/02/2017 20:11   US Ob Transvaginal  Result Date: 05/30/2017  CLINICAL DATA:  Pregnant patient with cramping. EXAM: OBSTETRIC <14 WK [place] AND TRANSVAGINAL OB US TECHNIQUE: Both transabdominal and transvaginal ultrasound examinations were performed for complete evaluation of the gestation as well as the maternal uterus, adnexal regions, and pelvic cul-de-sac. Transvaginal technique was performed to assess early pregnancy. COMPARISON:  Pelvic ultrasound 08/17/2011 FINDINGS: Intrauterine gestational sac: None Yolk sac:  Not Visualized. Embryo:  Not Visualized. Cardiac Activity: Not Visualized. Maternal uterus/adnexae: Normal right and left ovaries. No free fluid in the pelvis. Small amount of debris within the endometrial canal. IMPRESSION: No intrauterine gestation identified. In the setting of positive pregnancy test and no definite intrauterine pregnancy, this reflects a pregnancy of unknown location. Differential considerations include early normal IUP, abnormal IUP, or nonvisualized ectopic pregnancy. Differentiation is achieved with serial beta HCG supplemented by repeat sonography as clinically warranted. Small amount of debris in the endometrial canal. Recommend attention on follow-up exam. Electronically Signed   By: Annia Belt M.D.   On: 05/30/2017 15:22   US Pelvic Doppler (torsion R/o Or Mass Arterial Flow)  Result Date: 05/31/2017 CLINICAL DATA:  Abdominal pain with positive pregnancy test. Rising beta  HCG to 544 today versus 333 yesterday. EXAM: OBSTETRIC <14 WK [place] AND TRANSVAGINAL OB US DOPPLER ULTRASOUND OF OVARIES TECHNIQUE: Both transabdominal and transvaginal ultrasound examinations were performed for complete evaluation of the gestation as well as the maternal uterus, adnexal regions, and pelvic cul-de-sac. Transvaginal technique was performed to assess early pregnancy. Color and duplex Doppler ultrasound was utilized to evaluate blood flow to the ovaries. COMPARISON:  Pelvic ultrasound from 1 day prior. FINDINGS: A moderate amount of hypoechoic complex fluid is now seen within the cul-de-sac and pelvis, new since yesterday's exam and concerning for hemorrhage. An intrauterine pregnancy is not identified. Likewise an ectopic pregnancy is not definitively identified but must be assumed given the presence of complex free fluid within the pelvis. Patient reportedly was in severe pain per discussion with the ultrasound technologist. Intrauterine gestational sac: None Yolk sac:  Not Visualized. Embryo:  Not Visualized. Cardiac Activity: Not Visualized. Heart Rate: Not applicable Subchorionic hemorrhage:  None visualized. Maternal uterus/adnexae: Retroverted uterus. Right ovary visualized measuring 3.2 x 1.9 x 2 cm. Left ovary visualized measuring 3.4 x 1.7 x 1.7 cm. Corpus luteal cyst is suggested of the left ovary measuring 1.4 x 1.2 x 1.4 cm. Pulsed Doppler evaluation of both ovaries demonstrates normal appearing low-resistance arterial and venous waveforms. IMPRESSION: 1. New moderate complex free fluid within the pelvis concerning for hemorrhage. Given positive pregnancy test and lack of intrauterine gestation, ruptured ectopic is the leading consideration for the hypoechoic complex fluid. Critical Value/emergent results were called by telephone at the time of interpretation on 05/31/2017 at 10:06 pm to PA Sharen Heck who verbally acknowledged these results. 2. No ovarian torsion. Electronically Signed    By: Tollie Eth M.D.   On: 05/31/2017 22:07   US Ob Less Than 14 Weeks With Ob Transvaginal  Result Date: 05/31/2017 CLINICAL DATA:  Abdominal pain with positive pregnancy test. Rising beta HCG to 544 today versus 333 yesterday. EXAM: OBSTETRIC <14 WK [place] AND TRANSVAGINAL OB US DOPPLER ULTRASOUND OF OVARIES TECHNIQUE: Both transabdominal and transvaginal ultrasound examinations were performed for complete evaluation of the gestation as well as the maternal uterus, adnexal regions, and pelvic cul-de-sac. Transvaginal technique was performed to assess early pregnancy. Color and duplex Doppler ultrasound was utilized to evaluate blood flow to the ovaries. COMPARISON:  Pelvic ultrasound from 1 day prior. FINDINGS: A  moderate amount of hypoechoic complex fluid is now seen within the cul-de-sac and pelvis, new since yesterday's exam and concerning for hemorrhage. An intrauterine pregnancy is not identified. Likewise an ectopic pregnancy is not definitively identified but must be assumed given the presence of complex free fluid within the pelvis. Patient reportedly was in severe pain per discussion with the ultrasound technologist. Intrauterine gestational sac: None Yolk sac:  Not Visualized. Embryo:  Not Visualized. Cardiac Activity: Not Visualized. Heart Rate: Not applicable Subchorionic hemorrhage:  None visualized. Maternal uterus/adnexae: Retroverted uterus. Right ovary visualized measuring 3.2 x 1.9 x 2 cm. Left ovary visualized measuring 3.4 x 1.7 x 1.7 cm. Corpus luteal cyst is suggested of the left ovary measuring 1.4 x 1.2 x 1.4 cm. Pulsed Doppler evaluation of both ovaries demonstrates normal appearing low-resistance arterial and venous waveforms. IMPRESSION: 1. New moderate complex free fluid within the pelvis concerning for hemorrhage. Given positive pregnancy test and lack of intrauterine gestation, ruptured ectopic is the leading consideration for the hypoechoic complex fluid. Critical Value/emergent  results were called by telephone at the time of interpretation on 05/31/2017 at 10:06 pm to PA Sharen Heck who verbally acknowledged these results. 2. No ovarian torsion. Electronically Signed   By: Tollie Eth M.D.   On: 05/31/2017 22:07    MAU Management/MDM: Hcg did not rise appropriately today with increase of only 20%.  US shows increased size of right adnexal mass but also some increase in endometrial fluid.  No free fluid in the abdomen on today's [place].  Consult Dr Earlene Plater who reviewed images and labs and consulted Dr Despina Hidden and Dr Dolan Amen to review pt entire presentation.  Recommend methotrexate today for likely ectopic pregnancy with no evidence of normally growing intrauterine pregnancy.  Discussed with pt including benefits/risks of methotrexate and she agreed to plan of care.  Pt had to leave MAU to pick up her son but returned to MAU in ~1 hour and methotrexate was ordered and given.  Pt tolerated well.  Pt to d/c PNV and Rx for Phenergan, oxycodone 5 mg Q 6 hours PRN x 6 tabs (pt allergic to Tylenol), and Diflucan on pt request for diagnosed yeast infection from 05/30/17.  Pt and support person's questions answered in detail.  Pt to f/u on Day 4 in MAU on Saturday and Day 7 in [facility] for hcg levels.   Return to MAU as needed for emergencies.  Pt discharged with strict ectopic precautions.  ASSESSMENT 1. Right tubal pregnancy without intrauterine pregnancy   2. Inappropriate change in quantitative hCG in early pregnancy     PLAN Discharge home  Allergies as of 06/05/2017      Reactions   Aspirin Itching   Pt states she does fine when she takes ibuprofen    Tylenol [acetaminophen] Itching      Medication List    STOP taking these medications   prenatal multivitamin Tabs tablet     TAKE these medications   fluconazole 150 MG tablet Commonly known as:  DIFLUCAN Take 1 tablet (150 mg total) by mouth once for 1 dose.   oxyCODONE 5 MG immediate release tablet Commonly known as:   ROXICODONE Take 1 tablet (5 mg total) by mouth every 6 (six) hours as needed for severe pain.   promethazine 25 MG tablet Commonly known as:  PHENERGAN Take 0.5-1 tablets (12.5-25 mg total) by mouth every 6 (six) hours as needed for nausea.      Follow-up Information    [facility] [place] Follow up.   Why:  Return to MAU on Saturday 06/08/17 for repeat labs.  Return [facility] Tuesday, 06/11/17, [facility].  Return to MAU sooner with increased pain or heavy bleeding. Contact information: [address] [place] [phone]          Sharen CounterLisa Leftwich-Kirby Certified Nurse-Midwife 06/05/2017  9:19 PM

## 2017-06-08 ENCOUNTER — Inpatient Hospital Stay (HOSPITAL_COMMUNITY)
Admission: AD | Admit: 2017-06-08 | Discharge: 2017-06-08 | Disposition: A | Discharge status: Home / Self Care | Payer: Medicaid Other | Source: Ambulatory Visit | Attending: Obstetrics and Gynecology | Admitting: Obstetrics and Gynecology

## 2017-06-08 DIAGNOSIS — O009 Unspecified ectopic pregnancy without intrauterine pregnancy: Secondary | ICD-10-CM | POA: Insufficient documentation

## 2017-06-08 DIAGNOSIS — O26891 Other specified pregnancy related conditions, first trimester: Secondary | ICD-10-CM

## 2017-06-08 DIAGNOSIS — R109 Unspecified abdominal pain: Secondary | ICD-10-CM

## 2017-06-08 LAB — CBC
HEMATOCRIT: 35.2 % — AB (ref 36.0–46.0)
HEMOGLOBIN: 11.7 g/dL — AB (ref 12.0–15.0)
MCH: 27.3 pg (ref 26.0–34.0)
MCHC: 33.2 g/dL (ref 30.0–36.0)
MCV: 82.1 fL (ref 78.0–100.0)
Platelets: 240 10*3/uL (ref 150–400)
RBC: 4.29 MIL/uL (ref 3.87–5.11)
RDW: 13.7 % (ref 11.5–15.5)
WBC: 7 10*3/uL (ref 4.0–10.5)

## 2017-06-08 LAB — HCG, QUANTITATIVE, PREGNANCY: hCG, Beta Chain, Quant, S: 921 m[IU]/mL — ABNORMAL HIGH (ref <5)

## 2017-06-08 NOTE — MAU Provider Note (Signed)
Ms. Lori Baldwin  is a 22 y.o. G2P1001  at 2870w0d who presents to MAU today for follow-up quant hCG. She is day 4 s/p methotrexate for ectopic pregnancy. The patient denies abdominal pain, vaginal bleeding, N/V or fever.   BP (!) 102/52   Pulse 95   Temp 99.2 F (37.3 C)   Resp 18   Ht 5' (1.524 m)   LMP 04/27/2017   BMI 18.94 kg/m   GENERAL: Well-developed, well-nourished female in no acute distress.  HEENT: Normocephalic, atraumatic.   LUNGS: Effort normal HEART: Regular rate  SKIN: Warm, dry and without erythema PSYCH: Normal mood and affect  Component     Latest Ref Rng & Units 06/05/2017 06/08/2017  HCG, Beta Chain, Quant, S     <5 mIU/mL 857 (H) MTX given 921 (H) Day 4    Minimal rise in HCG & patient asymptomatic with stable VS  A: 1. Ectopic pregnancy without intrauterine pregnancy, unspecified location   2. 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

## 2017-06-08 NOTE — Discharge Instructions (Signed)
Ectopic Pregnancy An ectopic pregnancy is when the fertilized egg attaches (implants) outside the uterus. Most ectopic pregnancies occur in one of the tubes where eggs travel from the ovary to the uterus (fallopian tubes), but the implanting can occur in other locations. In rare cases, ectopic pregnancies occur on the ovary, intestine, pelvis, abdomen, or cervix. In an ectopic pregnancy, the fertilized egg does not have the ability to develop into a normal, healthy baby. A ruptured ectopic pregnancy is one in which tearing or bursting of a fallopian tube causes internal bleeding. Often, there is intense lower abdominal pain, and vaginal bleeding sometimes occurs. Having an ectopic pregnancy can be life-threatening. If this dangerous condition is not treated, it can lead to blood loss, shock, or even death. What are the causes? The most common cause of this condition is damage to one of the fallopian tubes. A fallopian tube may be narrowed or blocked, and that keeps the fertilized egg from reaching the uterus. What increases the risk? This condition is more likely to develop in women of childbearing age who have different levels of risk. The levels of risk can be divided into three categories. High risk  You have gone through infertility treatment.  You have had an ectopic pregnancy before.  You have had surgery on the fallopian tubes, or another surgical procedure, such as an abortion.  You have had surgery to have the fallopian tubes tied (tubal ligation).  You have problems or diseases of the fallopian tubes.  You have been exposed to diethylstilbestrol (DES). This medicine was used until 1971, and it had effects on babies whose mothers took the medicine.  You become pregnant while using an IUD (intrauterine device) for birth control. Moderate risk  You have a history of infertility.  You have had an STI (sexually transmitted infection).  You have a history of pelvic inflammatory  disease (PID).  You have scarring from endometriosis.  You have multiple sexual partners.  You smoke. Low risk  You have had pelvic surgery.  You use vaginal douches.  You became sexually active before age 18. What are the signs or symptoms? Common symptoms of this condition include normal pregnancy symptoms, such as missing a period, nausea, tiredness, abdominal pain, breast tenderness, and bleeding. However, ectopic pregnancy will have additional symptoms, such as:  Pain with intercourse.  Irregular vaginal bleeding or spotting.  Cramping or pain on one side or in the lower abdomen.  Fast heartbeat, low blood pressure, and sweating.  Passing out while having a bowel movement.  Symptoms of a ruptured ectopic pregnancy and internal bleeding may include:  Sudden, severe pain in the abdomen and pelvis.  Dizziness, weakness, light-headedness, or fainting.  Pain in the shoulder or neck area.  How is this diagnosed? This condition is diagnosed by:  A pelvic exam to locate pain or a mass in the abdomen.  A pregnancy test. This blood test checks for the presence as well as the specific level of pregnancy hormone in the bloodstream.  Ultrasound. This is performed if a pregnancy test is positive. In this test, a probe is inserted into the vagina. The probe will detect a fetus, possibly in a location other than the uterus.  Taking a sample of uterus tissue (dilation and curettage, or D&C).  Surgery to perform a visual exam of the inside of the abdomen using a thin, lighted tube that has a tiny camera on the end (laparoscope).  Culdocentesis. This procedure involves inserting a needle at the top   of the vagina, behind the uterus. If blood is present in this area, it may indicate that a fallopian tube is torn.  How is this treated? This condition is treated with medicine or surgery. Medicine  An injection of a medicine (methotrexate) may be given to cause the pregnancy tissue  to be absorbed. This medicine may save your fallopian tube. It may be given if: ? The diagnosis is made early, with no signs of active bleeding. ? The fallopian tube has not ruptured. ? You are considered to be a good candidate for the medicine. Usually, pregnancy hormone blood levels are checked after methotrexate treatment. This is to be sure that the medicine is effective. It may take 4-6 weeks for the pregnancy to be absorbed. Most pregnancies will be absorbed by 3 weeks. Surgery  A laparoscope may be used to remove the pregnancy tissue.  If severe internal bleeding occurs, a larger cut (incision) may be made in the lower abdomen (laparotomy) to remove the fetus and placenta. This is done to stop the bleeding.  Part or all of the fallopian tube may be removed (salpingectomy) along with the fetus and placenta. The fallopian tube may also be repaired during the surgery.  In very rare circumstances, removal of the uterus (hysterectomy) may be required.  After surgery, pregnancy hormone testing may be done to be sure that there is no pregnancy tissue left. Whether your treatment is medicine or surgery, you may receive a Rho (D) immune globulin shot to prevent problems with any future pregnancy. This shot may be given if:  You are Rh-negative and the baby's father is Rh-positive.  You are Rh-negative and you do not know the Rh type of the baby's father.  Follow these instructions at home:  Rest and limit your activity after the procedure for as long as told by your health care provider.  Until your health care provider says that it is safe: ? Do not lift anything that is heavier than 10 lb (4.5 kg), or the limit that your health care provider tells you. ? Avoid physical exercise and any movement that requires effort (is strenuous).  To help prevent constipation: ? Eat a healthy diet that includes fruits, vegetables, and whole grains. ? Drink 6-8 glasses of water per day. Get help  right away if:  You develop worsening pain that is not relieved by medicine.  You have: ? A fever or chills. ? Vaginal bleeding. ? Redness and swelling at the incision site. ? Nausea and vomiting.  You feel dizzy or weak.  You feel light-headed or you faint. This information is not intended to replace advice given to you by your health care provider. Make sure you discuss any questions you have with your health care provider. Document Released: 06/21/2004 Document Revised: 01/11/2016 Document Reviewed: 12/14/2015 Elsevier Interactive Patient Education  2018 [employer]  

## 2017-06-08 NOTE — MAU Note (Addendum)
Pt here for f/u BHCG. Reports still having some abd pain and cramping not as bad ad when she was here the other day. Reports she "passed a blood clot" just now when she went to the br. Had not had much bleeding before.

## 2017-06-11 ENCOUNTER — Telehealth: Payer: Self-pay | Admitting: *Deleted

## 2017-06-11 ENCOUNTER — Ambulatory Visit: Payer: Medicaid Other

## 2017-06-11 NOTE — Telephone Encounter (Signed)
Patient was listed on the MAU list for a STAT Beta hcg today.  Patient did not come in for this appointment.  Called patient and left message to please call our office regarding a missed appointment.    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

## 2017-06-12 ENCOUNTER — Inpatient Hospital Stay (HOSPITAL_COMMUNITY)
Admission: AD | Admit: 2017-06-12 | Discharge: 2017-06-13 | Disposition: A | Discharge status: Home / Self Care | Payer: Medicaid Other | Source: Ambulatory Visit | Attending: Obstetrics & Gynecology | Admitting: Obstetrics & Gynecology

## 2017-06-12 DIAGNOSIS — O00109 Unspecified tubal pregnancy without intrauterine pregnancy: Secondary | ICD-10-CM | POA: Diagnosis not present

## 2017-06-12 DIAGNOSIS — Z5181 Encounter for therapeutic drug level monitoring: Secondary | ICD-10-CM

## 2017-06-12 DIAGNOSIS — Z79899 Other long term (current) drug therapy: Secondary | ICD-10-CM | POA: Diagnosis not present

## 2017-06-12 DIAGNOSIS — O26891 Other specified pregnancy related conditions, first trimester: Secondary | ICD-10-CM | POA: Diagnosis not present

## 2017-06-12 DIAGNOSIS — R109 Unspecified abdominal pain: Secondary | ICD-10-CM | POA: Diagnosis present

## 2017-06-12 DIAGNOSIS — Z3A00 Weeks of gestation of pregnancy not specified: Secondary | ICD-10-CM | POA: Diagnosis not present

## 2017-06-12 DIAGNOSIS — Z886 Allergy status to analgesic agent status: Secondary | ICD-10-CM | POA: Diagnosis not present

## 2017-06-12 LAB — HCG, QUANTITATIVE, PREGNANCY: hCG, Beta Chain, Quant, S: 182 m[IU]/mL — ABNORMAL HIGH (ref <5)

## 2017-06-12 NOTE — MAU Note (Signed)
Pt here for follow up labs for day 7 of MTX.  Pt denies changes in pain or vaginal bleeding.

## 2017-06-13 DIAGNOSIS — Z5181 Encounter for therapeutic drug level monitoring: Secondary | ICD-10-CM | POA: Diagnosis not present

## 2017-06-13 DIAGNOSIS — R109 Unspecified abdominal pain: Secondary | ICD-10-CM | POA: Diagnosis not present

## 2017-06-13 DIAGNOSIS — Z79899 Other long term (current) drug therapy: Secondary | ICD-10-CM

## 2017-06-13 DIAGNOSIS — O00109 Unspecified tubal pregnancy without intrauterine pregnancy: Secondary | ICD-10-CM | POA: Diagnosis not present

## 2017-06-13 NOTE — Discharge Instructions (Signed)
Methotrexate Treatment for an Ectopic Pregnancy, Care After °Refer to this sheet in the next few weeks. These instructions provide you with information on caring for yourself after your procedure. Your health care provider may also give you more specific instructions. Your treatment has been planned according to current medical practices, but problems sometimes occur. Call your health care provider if you have any problems or questions after your procedure. °What can I expect after the procedure? °You may have some abdominal cramping, vaginal bleeding, and fatigue in the first few days after taking methotrexate. Some other possible side effects of methotrexate include: °· Nausea. °· Vomiting. °· Diarrhea. °· Mouth sores. °· Swelling or irritation of the lining of your lungs (pneumonitis). °· Liver damage. °· Hair loss. ° °Follow these instructions at home: °After you have received the methotrexate medicine, you need to be careful of your activities and watch your condition for several weeks. It may take 1 week before your hormone levels return to normal. °Activity °· Do not have sexual intercourse until your health care provider says it is safe to do so. °· You may resume your usual diet. °· Limit strenuous activity. °· Do not drink alcohol. °General instructions °· Do not take aspirin, ibuprofen, or naproxen (nonsteroidal anti-inflammatory drugs [NSAIDs]). °· Do not take folic acid, prenatal vitamins, or other vitamins that contain folic acid. °· Avoid traveling too far away from your health care provider. °· Keep all follow-up visits as told by your health care provider. This is important. °Contact a health care provider if: °· You cannot control your nausea and vomiting. °· You cannot control your diarrhea. °· You have sores in your mouth and want treatment. °· You need pain medicine for your abdominal pain. °· You have a rash. °· You are having a reaction to the medicine. °Get help right away if: °· You have  increasing abdominal or pelvic pain. °· You notice increased bleeding. °· You feel light-headed, or you faint. °· You have shortness of breath. °· Your heart rate increases. °· You have a cough. °· You have chills. °· You have a fever. °This information is not intended to replace advice given to you by your health care provider. Make sure you discuss any questions you have with your health care provider. °Document Released: 05/03/2011 Document Revised: 10/20/2015 Document Reviewed: 03/02/2013 °Elsevier Interactive Patient Education © 2017 [employer] ° °Ectopic Pregnancy °An ectopic pregnancy is when the fertilized egg attaches (implants) outside the uterus. Most ectopic pregnancies occur in one of the tubes where eggs travel from the ovary to the uterus (fallopian tubes), but the implanting can occur in other locations. In rare cases, ectopic pregnancies occur on the ovary, intestine, pelvis, abdomen, or cervix. In an ectopic pregnancy, the fertilized egg does not have the ability to develop into a normal, healthy baby. °A ruptured ectopic pregnancy is one in which tearing or bursting of a fallopian tube causes internal bleeding. Often, there is intense lower abdominal pain, and vaginal bleeding sometimes occurs. Having an ectopic pregnancy can be life-threatening. If this dangerous condition is not treated, it can lead to blood loss, shock, or even death. °What are the causes? °The most common cause of this condition is damage to one of the fallopian tubes. A fallopian tube may be narrowed or blocked, and that keeps the fertilized egg from reaching the uterus. °What increases the risk? °This condition is more likely to develop in women of childbearing age who have different levels of risk. The   levels of risk can be divided into three categories. °High risk °· You have gone through infertility treatment. °· You have had an ectopic pregnancy before. °· You have had surgery on the fallopian tubes, or another  surgical procedure, such as an abortion. °· You have had surgery to have the fallopian tubes tied (tubal ligation). °· You have problems or diseases of the fallopian tubes. °· You have been exposed to diethylstilbestrol (DES). This medicine was used until 1971, and it had effects on babies whose mothers took the medicine. °· You become pregnant while using an IUD (intrauterine device) for birth control. °Moderate risk °· You have a history of infertility. °· You have had an STI (sexually transmitted infection). °· You have a history of pelvic inflammatory disease (PID). °· You have scarring from endometriosis. °· You have multiple sexual partners. °· You smoke. °Low risk °· You have had pelvic surgery. °· You use vaginal douches. °· You became sexually active before age 18. °What are the signs or symptoms? °Common symptoms of this condition include normal pregnancy symptoms, such as missing a period, nausea, tiredness, abdominal pain, breast tenderness, and bleeding. However, ectopic pregnancy will have additional symptoms, such as: °· Pain with intercourse. °· Irregular vaginal bleeding or spotting. °· Cramping or pain on one side or in the lower abdomen. °· Fast heartbeat, low blood pressure, and sweating. °· Passing out while having a bowel movement. ° °Symptoms of a ruptured ectopic pregnancy and internal bleeding may include: °· Sudden, severe pain in the abdomen and pelvis. °· Dizziness, weakness, light-headedness, or fainting. °· Pain in the shoulder or neck area. ° °How is this diagnosed? °This condition is diagnosed by: °· A pelvic exam to locate pain or a mass in the abdomen. °· A pregnancy test. This blood test checks for the presence as well as the specific level of pregnancy hormone in the bloodstream. °· Ultrasound. This is performed if a pregnancy test is positive. In this test, a probe is inserted into the vagina. The probe will detect a fetus, possibly in a location other than the uterus. °· Taking  a sample of uterus tissue (dilation and curettage, or D&C). °· Surgery to perform a visual exam of the inside of the abdomen using a thin, lighted tube that has a tiny camera on the end (laparoscope). °· Culdocentesis. This procedure involves inserting a needle at the top of the vagina, behind the uterus. If blood is present in this area, it may indicate that a fallopian tube is torn. ° °How is this treated? °This condition is treated with medicine or surgery. °Medicine °· An injection of a medicine (methotrexate) may be given to cause the pregnancy tissue to be absorbed. This medicine may save your fallopian tube. It may be given if: °? The diagnosis is made early, with no signs of active bleeding. °? The fallopian tube has not ruptured. °? You are considered to be a good candidate for the medicine. °Usually, pregnancy hormone blood levels are checked after methotrexate treatment. This is to be sure that the medicine is effective. It may take 4-6 weeks for the pregnancy to be absorbed. Most pregnancies will be absorbed by 3 weeks. °Surgery °· A laparoscope may be used to remove the pregnancy tissue. °· If severe internal bleeding occurs, a larger cut (incision) may be made in the lower abdomen (laparotomy) to remove the fetus and placenta. This is done to stop the bleeding. °· Part or all of the fallopian tube may be   removed (salpingectomy) along with the fetus and placenta. The fallopian tube may also be repaired during the surgery. °· In very rare circumstances, removal of the uterus (hysterectomy) may be required. °· After surgery, pregnancy hormone testing may be done to be sure that there is no pregnancy tissue left. °Whether your treatment is medicine or surgery, you may receive a Rho (D) immune globulin shot to prevent problems with any future pregnancy. This shot may be given if: °· You are Rh-negative and the baby's father is Rh-positive. °· You are Rh-negative and you do not know the Rh type of the baby's  father. ° °Follow these instructions at home: °· Rest and limit your activity after the procedure for as long as told by your health care provider. °· Until your health care provider says that it is safe: °? Do not lift anything that is heavier than 10 lb (4.5 kg), or the limit that your health care provider tells you. °? Avoid physical exercise and any movement that requires effort (is strenuous). °· To help prevent constipation: °? Eat a healthy diet that includes fruits, vegetables, and whole grains. °? Drink 6-8 glasses of water per day. °Get help right away if: °· You develop worsening pain that is not relieved by medicine. °· You have: °? A fever or chills. °? Vaginal bleeding. °? Redness and swelling at the incision site. °? Nausea and vomiting. °· You feel dizzy or weak. °· You feel light-headed or you faint. °This information is not intended to replace advice given to you by your health care provider. Make sure you discuss any questions you have with your health care provider. °Document Released: 06/21/2004 Document Revised: 01/11/2016 Document Reviewed: 12/14/2015 °Elsevier Interactive Patient Education © 2018 Else[employer] Inc. ° °

## 2017-06-13 NOTE — MAU Provider Note (Signed)
History     CSN: [id number]  Arrival date and time: 06/12/17 2249   None     Chief Complaint  Patient presents with  . Follow-up   Lori Baldwin is a 22 y.o.. G2P1001 who is day #8 S/P MTX. She was given MTX on 1/9, day #4 HCG showed a very minimal increase in HCG. She is still having some cramping and spotting off and on.    Gynecologic Exam  The patient's primary symptoms include pelvic pain and vaginal bleeding. This is a new problem. The current episode started in the past 7 days. The problem occurs intermittently. The problem has been gradually improving. The pain is mild. The problem affects both sides. She is pregnant. Pertinent negatives include no chills, fever, nausea or vomiting. The vaginal bleeding is spotting. She has not been passing clots. She has not been passing tissue. Nothing aggravates the symptoms. She has tried nothing for the symptoms.    Past Medical History:  Diagnosis Date  . Anemia   . BV (bacterial vaginosis)   . Gonorrhea   . Headache   . Pharyngitis   . Yeast vaginitis     Past Surgical History:  Procedure Laterality Date  . CESAREAN SECTION      Family History  Problem Relation Age of Onset  . Cancer Maternal Grandmother        breast  . Sickle cell anemia Paternal Grandmother   . Hypertension Mother   . Diabetes Maternal Grandfather     Social History   Tobacco Use  . Smoking status: Never Smoker  . Smokeless tobacco: Never Used  Substance Use Topics  . Alcohol use: No  . Drug use: Yes    Types: Marijuana    Comment: last was early Dec    Allergies:  Allergies  Allergen Reactions  . Aspirin Itching    Pt states she does fine when she takes ibuprofen    . Tylenol [Acetaminophen] Itching    Medications Prior to Admission  Medication Sig Dispense Refill Last Dose  . oxyCODONE (ROXICODONE) 5 MG immediate release tablet Take 1 tablet (5 mg total) by mouth every 6 (six) hours as needed for severe pain. 6 tablet 0   .  promethazine (PHENERGAN) 25 MG tablet Take 0.5-1 tablets (12.5-25 mg total) by mouth every 6 (six) hours as needed for nausea. 30 tablet 0     Review of Systems  Constitutional: Negative for chills and fever.  Gastrointestinal: Negative for nausea and vomiting.  Genitourinary: Positive for pelvic pain.   Physical Exam   Blood pressure 97/60, pulse 94, temperature 98 F (36.7 C), temperature source Oral, resp. rate 16, height 5' (1.524 Baldwin), weight 97 lb (44 kg), last menstrual period 04/27/2017, unknown if currently breastfeeding.  Physical Exam  Nursing note and vitals reviewed. Constitutional: She is oriented to person, place, and time. She appears well-developed and well-nourished. No distress.  HENT:  Head: Normocephalic.  Cardiovascular: Normal rate.  Respiratory: Effort normal.  GI: Soft. There is no tenderness. There is no rebound.  Neurological: She is alert and oriented to person, place, and time.  Skin: Skin is warm and dry.  Psychiatric: She has a normal mood and affect.     Results for Lori Baldwin, Lori Baldwin (MRN [id number]) as of 06/13/2017 00:02  Ref. Range 06/05/2017 10:44   06/08/2017 15:06 06/12/2017 23:08  HCG, Beta Chain, Quant, S Latest Ref Range: <5 mIU/mL 857 (H)   921 (H) 182 (H)    MAU  Course  Procedures  MDM   Assessment and Plan   1. Tubal pregnancy without intrauterine pregnancy, unspecified laterality   2. Encounter for methotrexate monitoring   3. Abdominal pain in pregnancy, first trimester    DC home Comfort measures reviewed  Bleeding precautions Ectopic precautions RX: none  Return to MAU as needed FU with clinic in about one week. Patient works almost every day. She doesn't know her schedule for the week of 1/28. She will check her work schedule. Message sent to clinic to schedule appt for that week based on her availability.   Follow-up Information    [facility].   Specialty:  Obstetrics and Gynecology Why:   They will call you for an appointment the week of 06/24/17.  Contact information: [address] [place] [address] [phone]           Thressa Sheller 06/13/2017, 12:02 AM

## 2017-06-26 ENCOUNTER — Other Ambulatory Visit: Payer: Self-pay | Admitting: *Deleted

## 2017-06-26 ENCOUNTER — Telehealth: Payer: Self-pay | Admitting: *Deleted

## 2017-06-26 ENCOUNTER — Ambulatory Visit: Payer: Medicaid Other

## 2017-06-26 ENCOUNTER — Encounter: Payer: Self-pay | Admitting: *Deleted

## 2017-06-26 DIAGNOSIS — O009 Unspecified ectopic pregnancy without intrauterine pregnancy: Secondary | ICD-10-CM

## 2017-06-26 NOTE — Progress Notes (Unsigned)
b

## 2017-06-26 NOTE — Telephone Encounter (Signed)
Lori Baldwin did not keep her scheduled appointment for follow up bhcg after ectopic pregnancy. I called [facility] and left a message notifying her she missed her appointment and that it is important she call back to reschedule. Will also send letter.

## 2017-09-17 ENCOUNTER — Other Ambulatory Visit: Payer: Self-pay

## 2017-09-17 ENCOUNTER — Encounter (HOSPITAL_COMMUNITY): Payer: Self-pay | Admitting: Emergency Medicine

## 2017-09-17 ENCOUNTER — Ambulatory Visit (HOSPITAL_COMMUNITY)
Admission: EM | Admit: 2017-09-17 | Discharge: 2017-09-17 | Disposition: A | Discharge status: Home / Self Care | Payer: Medicaid Other | Attending: Family Medicine | Admitting: Family Medicine

## 2017-09-17 DIAGNOSIS — N898 Other specified noninflammatory disorders of vagina: Secondary | ICD-10-CM | POA: Insufficient documentation

## 2017-09-17 DIAGNOSIS — Z79899 Other long term (current) drug therapy: Secondary | ICD-10-CM | POA: Insufficient documentation

## 2017-09-17 DIAGNOSIS — H1012 Acute atopic conjunctivitis, left eye: Secondary | ICD-10-CM | POA: Insufficient documentation

## 2017-09-17 DIAGNOSIS — H1031 Unspecified acute conjunctivitis, right eye: Secondary | ICD-10-CM

## 2017-09-17 DIAGNOSIS — Z886 Allergy status to analgesic agent status: Secondary | ICD-10-CM | POA: Insufficient documentation

## 2017-09-17 DIAGNOSIS — Z3202 Encounter for pregnancy test, result negative: Secondary | ICD-10-CM

## 2017-09-17 LAB — POCT URINALYSIS DIP (DEVICE)
BILIRUBIN URINE: NEGATIVE
Glucose, UA: NEGATIVE mg/dL
KETONES UR: NEGATIVE mg/dL
LEUKOCYTES UA: NEGATIVE
NITRITE: NEGATIVE
Protein, ur: NEGATIVE mg/dL
Specific Gravity, Urine: 1.015 (ref 1.005–1.030)
Urobilinogen, UA: 0.2 mg/dL (ref 0.0–1.0)
pH: 6.5 (ref 5.0–8.0)

## 2017-09-17 LAB — POCT PREGNANCY, URINE: PREG TEST UR: NEGATIVE

## 2017-09-17 MED ORDER — POLYMYXIN B-TRIMETHOPRIM 10000-0.1 UNIT/ML-% OP SOLN: 1.0000 [drp] | OPHTHALMIC | 0 refills | Status: DC

## 2017-09-17 MED ORDER — OLOPATADINE HCL 0.1 % OP SOLN: 1.0000 [drp] | Freq: Two times a day (BID) | OPHTHALMIC | 0 refills | Status: DC

## 2017-09-17 MED ORDER — METRONIDAZOLE 500 MG PO TABS: 500.0000 mg | ORAL_TABLET | Freq: Two times a day (BID) | ORAL | 0 refills | Status: AC

## 2017-09-17 NOTE — ED Notes (Signed)
Sent for a dirty and clean urine specimens

## 2017-09-17 NOTE — ED Provider Notes (Signed)
[facility]    CSN: [id number] Arrival date & time: 09/17/17  1346     History   Chief Complaint Chief Complaint  Patient presents with  . Conjunctivitis  . Vaginal Discharge    HPI Lori Baldwin is a 22 y.o. female presenting today for evaluation of vaginal discharge and a red eye.  States that she has had vaginal discharge for the past week, was initially associated with an odor and itching, but she took a Diflucan which she had leftover from a previous prescription from January.  States that the odor improved some, but she is still having some discharge and irritation.  She is having some mild suprapubic pressure, but did not denies any pelvic pain at this time.  She does note to have some pelvic pain earlier in the week, but it has not been persistent.  Denies any dysuria, increased frequency or urgency.  Patient did have an ectopic pregnancy in January.  Last menstrual period was around 3/28.  Patient is not on any form of birth control.  Patient also noting she woke up with a red eye with mild swelling associated with itching and irritation.  As well as a watery drainage.  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  She was advised that she may be allergic to them as the last was right above her bed.  She notes that her boyfriend was treated for pinkeye approximately 2 weeks ago.  Otherwise nobody at home with similar symptoms.  Denies any changes in vision or eye pain.  HPI  Past Medical History:  Diagnosis Date  . Anemia   . BV (bacterial vaginosis)   . Gonorrhea   . Headache   . Pharyngitis   . Yeast vaginitis     Patient Active Problem List   Diagnosis Date Noted  . Abdominal pain in pregnancy, first trimester 06/01/2017    Past Surgical History:  Procedure Laterality Date  . CESAREAN SECTION      OB History    Gravida  2   Para  1   Term  1   Preterm      AB      Living    1     SAB      TAB      Ectopic      Multiple      Live Births  1            Home Medications    Prior to Admission medications   Medication Sig Start Date End Date Taking? Authorizing Provider  metroNIDAZOLE (FLAGYL) 500 MG tablet Take 1 tablet (500 mg total) by mouth 2 (two) times daily for 7 days. 09/17/17 09/24/17  Wieters, Hallie C, PA-C  olopatadine (PATANOL) 0.1 % ophthalmic solution Place 1 drop into the left eye 2 (two) times daily. 09/17/17   Wieters, Hallie C, PA-C  oxyCODONE (ROXICODONE) 5 MG immediate release tablet Take 1 tablet (5 mg total) by mouth every 6 (six) hours as needed for severe pain. 06/05/17   Leftwich-Kirby, Wilmer Floor, CNM  promethazine (PHENERGAN) 25 MG tablet Take 0.5-1 tablets (12.5-25 mg total) by mouth every 6 (six) hours as needed for nausea. 06/05/17   Leftwich-Kirby, Wilmer Floor, CNM  trimethoprim-polymyxin b (POLYTRIM) ophthalmic solution Place 1 drop into the left eye every 4 (four) hours for 7 days. 09/17/17 09/24/17  Lew Dawes, PA-C    Family  History Family History  Problem Relation Age of Onset  . Cancer Maternal Grandmother        breast  . Sickle cell anemia Paternal Grandmother   . Hypertension Mother   . Diabetes Maternal Grandfather     Social History Social History   Tobacco Use  . Smoking status: Never Smoker  . Smokeless tobacco: Never Used  Substance Use Topics  . Alcohol use: No  . Drug use: Yes    Types: Marijuana    Comment: last was early Dec     Allergies   Aspirin and Tylenol [acetaminophen]   Review of Systems Review of Systems  Constitutional: Negative for fever.  Eyes: Positive for discharge, redness and itching. Negative for photophobia, pain and visual disturbance.  Respiratory: Negative for shortness of breath.   Cardiovascular: Negative for chest pain.  Gastrointestinal: Negative for abdominal pain, diarrhea, nausea and vomiting.  Genitourinary: Positive for vaginal discharge. Negative for  dysuria, flank pain, genital sores, hematuria, menstrual problem, vaginal bleeding and vaginal pain.  Musculoskeletal: Negative for back pain.  Skin: Negative for rash.  Neurological: Negative for dizziness, light-headedness and headaches.     Physical Exam Triage Vital Signs ED Triage Vitals  Enc Vitals Group     BP 09/17/17 1425 (!) 90/53     Pulse Rate 09/17/17 1425 83     Resp 09/17/17 1425 18     Temp 09/17/17 1425 98.7 F (37.1 C)     Temp Source 09/17/17 1425 Oral     SpO2 09/17/17 1425 100 %     Weight --      Height --      Head Circumference --      Peak Flow --      Pain Score 09/17/17 1421 7     Pain Loc --      Pain Edu? --      Excl. in GC? --    No data found.  Updated Vital Signs BP (!) 90/53 (BP Location: Left Arm)   Pulse 83   Temp 98.7 F (37.1 C) (Oral)   Resp 18   LMP 08/21/2016   SpO2 100%   Breastfeeding? Unknown   Visual Acuity Right Eye Distance:  20/30 Left Eye Distance:  20/30 Bilateral Distance:  20/25  Right Eye Near:   Left Eye Near:    Bilateral Near:     Physical Exam  Constitutional: She appears well-developed and well-nourished. No distress.  HENT:  Head: Normocephalic and atraumatic.  Eyes: Pupils are equal, round, and reactive to light. Conjunctivae and EOM are normal.  Left conjunctiva erythematous, with watery drainage, minor swelling to periorbital area, no overlying erythema.  Left eye appears slightly smaller compared to right.  Neck: Neck supple.  Cardiovascular: Normal rate and regular rhythm.  No murmur heard. Pulmonary/Chest: Effort normal and breath sounds normal. No respiratory distress.  Abdominal: Soft. There is no tenderness.  Musculoskeletal: She exhibits no edema.  Neurological: She is alert.  Skin: Skin is warm and dry.  Psychiatric: She has a normal mood and affect.  Nursing note and vitals reviewed.    UC Treatments / Results  Labs (all labs ordered are listed, but only abnormal results are  displayed) Labs Reviewed  POCT URINALYSIS DIP (DEVICE) - Abnormal; Notable for the following components:      Result Value   Hgb urine dipstick TRACE (*)    All other components within normal limits  POCT PREGNANCY, URINE  CERVICOVAGINAL ANCILLARY ONLY  EKG None Radiology No results found.  Procedures Procedures (including critical care time)  Medications Ordered in UC Medications - No data to display   Initial Impression / Assessment and Plan / UC Course  I have reviewed the triage vital signs and the nursing notes.  Pertinent labs & imaging results that were available during my care of the patient were reviewed by me and considered in my medical decision making (see chart for details).     UA with negative leuks and nitrites.  We will go ahead and treat for bacterial vaginosis as cause of vaginal discharge.  Vaginal swab obtained and will send off to check for STDs and to confirm appropriate treatment.  Left eye with possible allergic conjunctivitis versus bacterial.  Will treat for allergic/viral with olopatadine first.  Advised patient if she has worsening discharge or redness or persistent symptoms to fill the prescription for the Polytrim eyedrops.  Otherwise she should return if she develops any swelling, changes in vision, eye pain or worsening symptoms for reevaluation. Discussed strict return precautions. Patient verbalized understanding and is agreeable with plan.   Final Clinical Impressions(s) / UC Diagnoses   Final diagnoses:  Allergic conjunctivitis of left eye  Vaginal discharge    ED Discharge Orders        Ordered    metroNIDAZOLE (FLAGYL) 500 MG tablet  2 times daily     09/17/17 1455    olopatadine (PATANOL) 0.1 % ophthalmic solution  2 times daily     09/17/17 1455    trimethoprim-polymyxin b (POLYTRIM) ophthalmic solution  Every 4 hours     09/17/17 1455       Controlled Substance Prescriptions [place] Controlled Substance Registry consulted? Not  Applicable   Lew Dawes, [place] 09/17/17 1506

## 2017-09-17 NOTE — ED Triage Notes (Addendum)
Left eye is itchy, watery, draining, pink eye started today.    Patient says this is from "carpet beetles"  Patient says vaginal discharge started last week.  Reports pelvic pain last night.  Denies burning with urination.  Patient has itching

## 2017-09-17 NOTE — Discharge Instructions (Signed)
We are going to go ahead and treat you for bacterial vaginosis, please take metronidazole twice daily for the next week.  Do not use alcohol while taking.  We will call you with the results for the vaginal swab in approximately 5-7 days.  Please use olopatadine eyedrops to help with itching and irritation.  If symptoms not improving or developing thicker discharge from the eye, please fill Polytrim eyedrops.  Please return if developing changes in vision, swelling of the eye, eye pain.

## 2017-09-18 ENCOUNTER — Telehealth (HOSPITAL_COMMUNITY): Payer: Self-pay

## 2017-09-18 LAB — CERVICOVAGINAL ANCILLARY ONLY
Bacterial vaginitis: NEGATIVE
CANDIDA VAGINITIS: NEGATIVE
CHLAMYDIA, DNA PROBE: NEGATIVE
NEISSERIA GONORRHEA: NEGATIVE
Trichomonas: NEGATIVE

## 2017-09-18 NOTE — Telephone Encounter (Signed)
All std's are negative, attempted to reach patient no answer and no voicemail available.

## 2017-09-20 ENCOUNTER — Telehealth (HOSPITAL_COMMUNITY): Payer: Self-pay | Admitting: Emergency Medicine

## 2017-09-20 MED ORDER — POLYMYXIN B-TRIMETHOPRIM 10000-0.1 UNIT/ML-% OP SOLN: 1.0000 [drp] | OPHTHALMIC | 0 refills | Status: DC

## 2017-09-20 MED ORDER — POLYMYXIN B-TRIMETHOPRIM 10000-0.1 UNIT/ML-% OP SOLN: 1.0000 [drp] | OPHTHALMIC | 0 refills | Status: AC

## 2017-12-12 ENCOUNTER — Ambulatory Visit (HOSPITAL_COMMUNITY)
Admission: EM | Admit: 2017-12-12 | Discharge: 2017-12-12 | Disposition: A | Discharge status: Home / Self Care | Payer: Medicaid Other | Attending: Family Medicine | Admitting: Family Medicine

## 2017-12-12 ENCOUNTER — Encounter (HOSPITAL_COMMUNITY): Payer: Self-pay

## 2017-12-12 DIAGNOSIS — Z8619 Personal history of other infectious and parasitic diseases: Secondary | ICD-10-CM | POA: Insufficient documentation

## 2017-12-12 DIAGNOSIS — Z202 Contact with and (suspected) exposure to infections with a predominantly sexual mode of transmission: Secondary | ICD-10-CM

## 2017-12-12 DIAGNOSIS — N898 Other specified noninflammatory disorders of vagina: Secondary | ICD-10-CM | POA: Insufficient documentation

## 2017-12-12 DIAGNOSIS — R197 Diarrhea, unspecified: Secondary | ICD-10-CM

## 2017-12-12 DIAGNOSIS — Z886 Allergy status to analgesic agent status: Secondary | ICD-10-CM | POA: Insufficient documentation

## 2017-12-12 DIAGNOSIS — R102 Pelvic and perineal pain: Secondary | ICD-10-CM | POA: Insufficient documentation

## 2017-12-12 DIAGNOSIS — Z8249 Family history of ischemic heart disease and other diseases of the circulatory system: Secondary | ICD-10-CM | POA: Insufficient documentation

## 2017-12-12 DIAGNOSIS — Z3202 Encounter for pregnancy test, result negative: Secondary | ICD-10-CM

## 2017-12-12 LAB — POCT URINALYSIS DIP (DEVICE)
BILIRUBIN URINE: NEGATIVE
Glucose, UA: NEGATIVE mg/dL
KETONES UR: NEGATIVE mg/dL
Leukocytes, UA: NEGATIVE
Nitrite: NEGATIVE
PH: 6 (ref 5.0–8.0)
Protein, ur: NEGATIVE mg/dL
SPECIFIC GRAVITY, URINE: 1.025 (ref 1.005–1.030)
Urobilinogen, UA: 0.2 mg/dL (ref 0.0–1.0)

## 2017-12-12 LAB — POCT PREGNANCY, URINE: PREG TEST UR: NEGATIVE

## 2017-12-12 MED ORDER — METRONIDAZOLE 500 MG PO TABS: 500.0000 mg | ORAL_TABLET | Freq: Two times a day (BID) | ORAL | 0 refills | Status: AC

## 2017-12-12 NOTE — Discharge Instructions (Signed)
Recommend start Flagyl 500mg  twice a day as directed. No sexual intercourse for at least 7 days. Continue to monitor symptoms. Follow-up pending lab results.

## 2017-12-12 NOTE — ED Provider Notes (Signed)
[facility]    CSN: [id number] Arrival date & time: 12/12/17  0805     History   Chief Complaint Chief Complaint  Patient presents with  . Vaginal Discharge    HPI Lori Baldwin is a 22 y.o. female.   22 year old female presents with unusual vaginal discharge and odor for the past 2 to 3 weeks. Also having some lower abdominal/pelvic cramping. Denies any fever, nausea or vomiting. Also having a few loose stools. LMP started last week and ended yesterday. No dysuria, hematuria or back pain. Symptoms are similar to previous episode of BV. Same consistent partner in the past 3 months but had one new brief sexual encounter. Did not use condoms. Not currently on any contraception. History of anemia and Gonorrhea as well as BV in the past, otherwise no chronic health issues. No current medications.   The history is provided by the patient.    Past Medical History:  Diagnosis Date  . Anemia   . BV (bacterial vaginosis)   . Gonorrhea   . Headache   . Pharyngitis   . Yeast vaginitis     Patient Active Problem List   Diagnosis Date Noted  . Abdominal pain in pregnancy, first trimester 06/01/2017    Past Surgical History:  Procedure Laterality Date  . CESAREAN SECTION      OB History    Gravida  2   Para  1   Term  1   Preterm      AB      Living  1     SAB      TAB      Ectopic      Multiple      Live Births  1            Home Medications    Prior to Admission medications   Medication Sig Start Date End Date Taking? Authorizing Provider  metroNIDAZOLE (FLAGYL) 500 MG tablet Take 1 tablet (500 mg total) by mouth 2 (two) times daily for 7 days. 12/12/17 12/19/17  Sudie Grumbling, NP    Family History Family History  Problem Relation Age of Onset  . Cancer Maternal Grandmother        breast  . Sickle cell anemia Paternal Grandmother   . Hypertension Mother   . Diabetes Maternal Grandfather     Social History Social History    Tobacco Use  . Smoking status: Never Smoker  . Smokeless tobacco: Never Used  Substance Use Topics  . Alcohol use: No  . Drug use: Yes    Types: Marijuana    Comment: last was early Dec     Allergies   Aspirin and Tylenol [acetaminophen]   Review of Systems Review of Systems  Constitutional: Negative for activity change, appetite change, chills, fatigue and fever.  HENT: Negative for mouth sores, sore throat and trouble swallowing.   Respiratory: Negative for cough, chest tightness, shortness of breath and wheezing.   Cardiovascular: Negative for chest pain and palpitations.  Gastrointestinal: Positive for diarrhea. Negative for abdominal pain, blood in stool, constipation, nausea and vomiting.  Genitourinary: Positive for pelvic pain (cramping), vaginal discharge and vaginal pain. Negative for decreased urine volume, difficulty urinating, dysuria, flank pain, frequency, genital sores, hematuria, urgency and vaginal bleeding.  Musculoskeletal: Negative for arthralgias, back pain and myalgias.  Skin: Negative for color change, rash and wound.  Allergic/Immunologic: Negative for immunocompromised state.  Neurological: Negative for dizziness, tremors, seizures, syncope, weakness, light-headedness,  numbness and headaches.  Hematological: Negative for adenopathy. Does not bruise/bleed easily.  Psychiatric/Behavioral: Negative.      Physical Exam Triage Vital Signs ED Triage Vitals  Enc Vitals Group     BP 12/12/17 0833 108/63     Pulse Rate 12/12/17 0833 80     Resp 12/12/17 0833 20     Temp 12/12/17 0833 98 F (36.7 C)     Temp Source 12/12/17 0833 Oral     SpO2 12/12/17 0833 100 %     Weight --      Height --      Head Circumference --      Peak Flow --      Pain Score 12/12/17 0831 4     Pain Loc --      Pain Edu? --      Excl. in GC? --    No data found.  Updated Vital Signs BP 108/63 (BP Location: Right Arm)   Pulse 80   Temp 98 F (36.7 C) (Oral)   Resp  20   LMP 12/12/2017   SpO2 100%   Visual Acuity Right Eye Distance:   Left Eye Distance:   Bilateral Distance:    Right Eye Near:   Left Eye Near:    Bilateral Near:     Physical Exam  Constitutional: She is oriented to person, place, and time. Vital signs are normal. She appears well-developed and well-nourished. She is cooperative. She does not appear ill. No distress.  Patient sitting on exam table in no acute distress.   HENT:  Head: Normocephalic and atraumatic.  Mouth/Throat: Oropharynx is clear and moist.  Eyes: Conjunctivae and EOM are normal.  Neck: Normal range of motion. Neck supple.  Cardiovascular: Normal rate, regular rhythm and normal heart sounds.  No murmur heard. Pulmonary/Chest: Effort normal and breath sounds normal. No respiratory distress. She has no decreased breath sounds. She has no wheezes. She has no rhonchi. She has no rales.  Abdominal: Soft. Normal appearance and bowel sounds are normal. She exhibits no distension, no fluid wave, no abdominal bruit and no mass. There is no hepatosplenomegaly. There is no tenderness. There is no rigidity, no rebound, no guarding and no CVA tenderness. Hernia confirmed negative in the right inguinal area and confirmed negative in the left inguinal area.  Genitourinary: Uterus normal. Pelvic exam was performed with patient in the knee-chest position. There is no rash, tenderness, lesion or injury on the right labia. There is no rash, tenderness, lesion or injury on the left labia. Cervix exhibits discharge. Cervix exhibits no motion tenderness and no friability. Right adnexum displays no mass and no tenderness. Left adnexum displays no mass and no tenderness. There is bleeding (slight dark brown blood present at cervix) in the vagina. No erythema or tenderness in the vagina. No foreign body in the vagina. No signs of injury around the vagina. Vaginal discharge (thin white with odor) found.  Musculoskeletal: Normal range of motion.   Lymphadenopathy:    She has no cervical adenopathy. No inguinal adenopathy noted on the right or left side.  Neurological: She is alert and oriented to person, place, and time.  Skin: Skin is warm and dry. No rash noted.  Psychiatric: She has a normal mood and affect. Her behavior is normal. Judgment and thought content normal.  Vitals reviewed.    UC Treatments / Results  Labs (all labs ordered are listed, but only abnormal results are displayed) Labs Reviewed  POCT URINALYSIS DIP (DEVICE) -  Abnormal; Notable for the following components:      Result Value   Hgb urine dipstick TRACE (*)    All other components within normal limits  RPR  HIV ANTIBODY (ROUTINE TESTING)  POCT PREGNANCY, URINE  CERVICOVAGINAL ANCILLARY ONLY    EKG None  Radiology No results found.  Procedures Procedures (including critical care time)  Medications Ordered in UC Medications - No data to display  Initial Impression / Assessment and Plan / UC Course  I have reviewed the triage vital signs and the nursing notes.  Pertinent labs & imaging results that were available during my care of the patient were reviewed by me and considered in my medical decision making (see chart for details).    Reviewed negative pregnancy test and essentially negative urinalysis with patient- doubt UTI. Discussed that the discharge and odor may be BV, although test results at last visit were all negative. Discussed other infections and causes- patient requests RPR and HIV testing today. Declines treatment for GC/Chlamydia- will wait for results. Will start Flagyl 500mg  twice a day as directed for now. No sexual intercourse for at least 7 days. Encouraged to use condoms with each and every future sexual encounter. May also need to see [facility] for routine exams and contraception. Follow-up pending lab results.   Final Clinical Impressions(s) / UC Diagnoses   Final diagnoses:  Vaginal discharge  Pelvic  pain in female  Potential exposure to STD     Discharge Instructions     Recommend start Flagyl 500mg  twice a day as directed. No sexual intercourse for at least 7 days. Continue to monitor symptoms. Follow-up pending lab results.     ED Prescriptions    Medication Sig Dispense Auth. Provider   metroNIDAZOLE (FLAGYL) 500 MG tablet Take 1 tablet (500 mg total) by mouth 2 (two) times daily for 7 days. 14 tablet Sudie Grumbling, NP     Controlled Substance Prescriptions [place] Controlled Substance Registry consulted? Not Applicable   Sudie Grumbling, NP 12/13/17 [phone]

## 2017-12-12 NOTE — ED Triage Notes (Signed)
Pt presents with vaginal discharge and pelvic cramping.

## 2017-12-13 LAB — CERVICOVAGINAL ANCILLARY ONLY
BACTERIAL VAGINITIS: NEGATIVE
Candida vaginitis: NEGATIVE
Chlamydia: NEGATIVE
Neisseria Gonorrhea: NEGATIVE
TRICH (WINDOWPATH): NEGATIVE

## 2017-12-13 LAB — RPR: RPR: NONREACTIVE

## 2017-12-13 LAB — HIV ANTIBODY (ROUTINE TESTING W REFLEX): HIV SCREEN 4TH GENERATION: NONREACTIVE

## 2018-02-17 ENCOUNTER — Encounter (HOSPITAL_COMMUNITY): Payer: Self-pay | Admitting: Emergency Medicine

## 2018-02-17 ENCOUNTER — Ambulatory Visit (HOSPITAL_COMMUNITY)
Admission: EM | Admit: 2018-02-17 | Discharge: 2018-02-17 | Disposition: A | Discharge status: Home / Self Care | Payer: Medicaid Other | Attending: Family Medicine | Admitting: Family Medicine

## 2018-02-17 DIAGNOSIS — Z113 Encounter for screening for infections with a predominantly sexual mode of transmission: Secondary | ICD-10-CM

## 2018-02-17 DIAGNOSIS — N760 Acute vaginitis: Secondary | ICD-10-CM

## 2018-02-17 DIAGNOSIS — Z8619 Personal history of other infectious and parasitic diseases: Secondary | ICD-10-CM | POA: Insufficient documentation

## 2018-02-17 DIAGNOSIS — Z8249 Family history of ischemic heart disease and other diseases of the circulatory system: Secondary | ICD-10-CM | POA: Insufficient documentation

## 2018-02-17 DIAGNOSIS — Z886 Allergy status to analgesic agent status: Secondary | ICD-10-CM | POA: Insufficient documentation

## 2018-02-17 MED ORDER — METRONIDAZOLE 500 MG PO TABS: 500.0000 mg | ORAL_TABLET | Freq: Two times a day (BID) | ORAL | 0 refills | Status: DC

## 2018-02-17 NOTE — Discharge Instructions (Signed)
We will start with treatment for bv today, pending your results of your vaginal swab.  Will notify of any positive findings and if any changes to treatment are needed.   Please withhold from intercourse for the next week. Please use condoms to prevent STD's.   If symptoms worsen or do not improve in the next week to return to be seen or to follow up with your PCP.

## 2018-02-17 NOTE — ED Provider Notes (Signed)
[facility]    CSN: [id number] Arrival date & time: 02/17/18  1819     History   Chief Complaint Chief Complaint  Patient presents with  . Vaginal Discharge    HPI Lori Baldwin is a 22 y.o. female.   Syona presents with complaints of vaginal discharge with odor which started two weeks ago. No itching. No pelvic or abdominal pain. No bleeding. No back pain or fevers. No urinary symptoms. Sexually active with 1 partner, does not use condoms. Denies any known exposure to STD's, states would like to wait for test results before taking treatment for this. States partner was incarcerated when symptoms started. LMp was earlier this month. States discharge is brown in color. Hx of bv, gonorrhea, yeast.     ROS per HPI.      Past Medical History:  Diagnosis Date  . Anemia   . BV (bacterial vaginosis)   . Gonorrhea   . Headache   . Pharyngitis   . Yeast vaginitis     Patient Active Problem List   Diagnosis Date Noted  . Abdominal pain in pregnancy, first trimester 06/01/2017    Past Surgical History:  Procedure Laterality Date  . CESAREAN SECTION      OB History    Gravida  2   Para  1   Term  1   Preterm      AB      Living  1     SAB      TAB      Ectopic      Multiple      Live Births  1            Home Medications    Prior to Admission medications   Medication Sig Start Date End Date Taking? Authorizing Provider  metroNIDAZOLE (FLAGYL) 500 MG tablet Take 1 tablet (500 mg total) by mouth 2 (two) times daily. 02/17/18   Georgetta Haber, NP    Family History Family History  Problem Relation Age of Onset  . Cancer Maternal Grandmother        breast  . Sickle cell anemia Paternal Grandmother   . Hypertension Mother   . Diabetes Maternal Grandfather     Social History Social History   Tobacco Use  . Smoking status: Never Smoker  . Smokeless tobacco: Never Used  Substance Use Topics  . Alcohol use: No  . Drug  use: Yes    Types: Marijuana    Comment: last was early Dec     Allergies   Aspirin and Tylenol [acetaminophen]   Review of Systems Review of Systems   Physical Exam Triage Vital Signs ED Triage Vitals [02/17/18 1855]  Enc Vitals Group     BP 102/64     Pulse Rate 93     Resp 16     Temp 98.4 F (36.9 C)     Temp src      SpO2 100 %     Weight      Height      Head Circumference      Peak Flow      Pain Score      Pain Loc      Pain Edu?      Excl. in GC?    No data found.  Updated Vital Signs BP 102/64   Pulse 93   Temp 98.4 F (36.9 C)   Resp 16   LMP 01/26/2018   SpO2 100%  Physical Exam  Constitutional: She is oriented to person, place, and time. She appears well-developed and well-nourished. No distress.  Cardiovascular: Normal rate, regular rhythm and normal heart sounds.  Pulmonary/Chest: Effort normal and breath sounds normal.  Abdominal: Soft. There is no tenderness. There is no rigidity, no rebound, no guarding and no CVA tenderness.  Genitourinary:  Genitourinary Comments: Denies sores, lesions, vaginal bleeding; no pelvic pain; gu exam deferred at this time and patient declined, vaginal self swab collected.    Neurological: She is alert and oriented to person, place, and time.  Skin: Skin is warm and dry.     UC Treatments / Results  Labs (all labs ordered are listed, but only abnormal results are displayed) Labs Reviewed  CERVICOVAGINAL ANCILLARY ONLY    EKG None  Radiology No results found.  Procedures Procedures (including critical care time)  Medications Ordered in UC Medications - No data to display  Initial Impression / Assessment and Plan / UC Course  I have reviewed the triage vital signs and the nursing notes.  Pertinent labs & imaging results that were available during my care of the patient were reviewed by me and considered in my medical decision making (see chart for details).     Patient declines empiric  std treatment today, vaginal swab pending. Will notify of any positive findings and if any changes to treatment are needed.  Flagyl initiated at this time.no pain, fevers. Will notify of any positive findings and if any changes to treatment are needed.  If symptoms worsen or do not improve in the next week to return to be seen or to follow up with PCP.   Patient verbalized understanding and agreeable to plan.   Final Clinical Impressions(s) / UC Diagnoses   Final diagnoses:  Acute vaginitis     Discharge Instructions     We will start with treatment for bv today, pending your results of your vaginal swab.  Will notify of any positive findings and if any changes to treatment are needed.   Please withhold from intercourse for the next week. Please use condoms to prevent STD's.   If symptoms worsen or do not improve in the next week to return to be seen or to follow up with your PCP.     ED Prescriptions    Medication Sig Dispense Auth. Provider   metroNIDAZOLE (FLAGYL) 500 MG tablet Take 1 tablet (500 mg total) by mouth 2 (two) times daily. 14 tablet Georgetta HaberBurky, Mclain Freer B, NP     Controlled Substance Prescriptions [place] Controlled Substance Registry consulted? Not Applicable   Georgetta HaberBurky, Missael Ferrari B, NP 02/17/18 1929

## 2018-02-17 NOTE — ED Triage Notes (Signed)
Pt c/o vaginal odor and discharge x2 weeks.

## 2018-02-19 LAB — CERVICOVAGINAL ANCILLARY ONLY
Bacterial vaginitis: POSITIVE — AB
Candida vaginitis: NEGATIVE
Chlamydia: NEGATIVE
Neisseria Gonorrhea: NEGATIVE
Trichomonas: NEGATIVE

## 2018-03-14 ENCOUNTER — Encounter (HOSPITAL_COMMUNITY): Payer: Self-pay | Admitting: Emergency Medicine

## 2018-03-14 ENCOUNTER — Ambulatory Visit (HOSPITAL_COMMUNITY)
Admission: EM | Admit: 2018-03-14 | Discharge: 2018-03-14 | Disposition: A | Discharge status: Home / Self Care | Payer: Medicaid Other | Attending: Family Medicine | Admitting: Family Medicine

## 2018-03-14 DIAGNOSIS — Z113 Encounter for screening for infections with a predominantly sexual mode of transmission: Secondary | ICD-10-CM

## 2018-03-14 DIAGNOSIS — N898 Other specified noninflammatory disorders of vagina: Secondary | ICD-10-CM

## 2018-03-14 DIAGNOSIS — R21 Rash and other nonspecific skin eruption: Secondary | ICD-10-CM | POA: Insufficient documentation

## 2018-03-14 DIAGNOSIS — N760 Acute vaginitis: Secondary | ICD-10-CM | POA: Insufficient documentation

## 2018-03-14 DIAGNOSIS — Z3202 Encounter for pregnancy test, result negative: Secondary | ICD-10-CM

## 2018-03-14 LAB — POCT PREGNANCY, URINE: Preg Test, Ur: NEGATIVE

## 2018-03-14 NOTE — ED Provider Notes (Signed)
[facility]    CSN: [id number] Arrival date & time: 03/14/18  1104     History   Chief Complaint Chief Complaint  Patient presents with  . Vaginal Discharge    HPI MAHA FISCHEL is a 22 y.o. female.   Patient is a 22 year old female who presents for 3 days of vaginal discharge.  Her symptoms have been constant and remain the same.  She has a past medical history of BV, gonorrhea, yeast.  She is also here today with concerns of rash to perineal area.  She noticed this 2 days ago.  Reports is slightly irritating but not terrible painful.  She is currently sexually active with one partner unprotected.  Her last mental period was 02/16/2018.  She denies any associated abdominal pain, pelvic pain, back pain.  No urinary symptoms.  No vaginal bleeding.  No fever, chills, body aches, fatigue.  ROS per HPI      Past Medical History:  Diagnosis Date  . Anemia   . BV (bacterial vaginosis)   . Gonorrhea   . Headache   . Pharyngitis   . Yeast vaginitis     Patient Active Problem List   Diagnosis Date Noted  . Abdominal pain in pregnancy, first trimester 06/01/2017    Past Surgical History:  Procedure Laterality Date  . CESAREAN SECTION      OB History    Gravida  2   Para  1   Term  1   Preterm      AB      Living  1     SAB      TAB      Ectopic      Multiple      Live Births  1            Home Medications    Prior to Admission medications   Medication Sig Start Date End Date Taking? Authorizing Provider  metroNIDAZOLE (FLAGYL) 500 MG tablet Take 1 tablet (500 mg total) by mouth 2 (two) times daily. Patient not taking: Reported on 03/14/2018 02/17/18   Georgetta Haber, NP    Family History Family History  Problem Relation Age of Onset  . Cancer Maternal Grandmother        breast  . Sickle cell anemia Paternal Grandmother   . Hypertension Mother   . Diabetes Maternal Grandfather     Social History Social History    Tobacco Use  . Smoking status: Never Smoker  . Smokeless tobacco: Never Used  Substance Use Topics  . Alcohol use: No  . Drug use: Yes    Types: Marijuana    Comment: last was early Dec     Allergies   Aspirin and Tylenol [acetaminophen]   Review of Systems Review of Systems   Physical Exam Triage Vital Signs ED Triage Vitals  Enc Vitals Group     BP 03/14/18 1118 120/70     Pulse Rate 03/14/18 1118 82     Resp 03/14/18 1118 16     Temp 03/14/18 1118 98.7 F (37.1 C)     Temp Source 03/14/18 1118 Oral     SpO2 03/14/18 1118 98 %     Weight --      Height --      Head Circumference --      Peak Flow --      Pain Score 03/14/18 1120 0     Pain Loc --      Pain  Edu? --      Excl. in [place]? --    No data found.  Updated Vital Signs BP 120/70 (BP Location: Left Arm)   Pulse 82   Temp 98.7 F (37.1 C) (Oral)   Resp 16   LMP 02/16/2018   SpO2 98%   Visual Acuity Right Eye Distance:   Left Eye Distance:   Bilateral Distance:    Right Eye Near:   Left Eye Near:    Bilateral Near:     Physical Exam  Constitutional: She is oriented to person, place, and time. She appears well-developed and well-nourished.  Very pleasant. Non toxic or ill appearing.  HENT:  Head: Normocephalic and atraumatic.  Eyes: Conjunctivae are normal.  Neck: Normal range of motion.  Pulmonary/Chest: Effort normal.  Genitourinary: Vagina normal.     Genitourinary Comments: Small area of erythema with 3 vesicles. Non draining.  Internal exam deferred. Pt self swabbed.   Musculoskeletal: Normal range of motion.  Neurological: She is alert and oriented to person, place, and time.  Skin: Skin is warm and dry.  Psychiatric: She has a normal mood and affect.  Nursing note and vitals reviewed.    UC Treatments / Results  Labs (all labs ordered are listed, but only abnormal results are displayed) Labs Reviewed  HSV CULTURE AND TYPING  POCT PREGNANCY, URINE  CERVICOVAGINAL  ANCILLARY ONLY    EKG None  Radiology No results found.  Procedures Procedures (including critical care time)  Medications Ordered in UC Medications - No data to display  Initial Impression / Assessment and Plan / UC Course  I have reviewed the triage vital signs and the nursing notes.  Pertinent labs & imaging results that were available during my care of the patient were reviewed by me and considered in my medical decision making (see chart for details).     Rash appears herpetic. Will wait on the final results for treatment.  Also sending self swab to test for BV, yeast and STDS.  Final Clinical Impressions(s) / UC Diagnoses   Final diagnoses:  Rash and nonspecific skin eruption  Acute vaginitis     Discharge Instructions     We have tested you today for STDs to include herpes. The rash appears to be herpetic but we will confirm with the swab we sent We will call you with any positive results and treat as needed.    ED Prescriptions    None     Controlled Substance Prescriptions [place] Controlled Substance Registry consulted? Not Applicable   Janace Aris, NP 03/14/18 1154

## 2018-03-14 NOTE — ED Triage Notes (Signed)
Pt states she has vaginal discharge x 3 days.  

## 2018-03-14 NOTE — Discharge Instructions (Addendum)
We have tested you today for STDs to include herpes. The rash appears to be herpetic but we will confirm with the swab we sent We will call you with any positive results and treat as needed.

## 2018-03-14 NOTE — ED Notes (Signed)
Pt states she also has a bump by her rectum she wants examined.

## 2018-03-17 LAB — CERVICOVAGINAL ANCILLARY ONLY
Bacterial vaginitis: NEGATIVE
CANDIDA VAGINITIS: POSITIVE — AB
CHLAMYDIA, DNA PROBE: NEGATIVE
NEISSERIA GONORRHEA: NEGATIVE
TRICH (WINDOWPATH): NEGATIVE

## 2018-03-17 LAB — HSV CULTURE AND TYPING

## 2018-03-18 ENCOUNTER — Telehealth: Payer: Self-pay | Admitting: Emergency Medicine

## 2018-03-18 MED ORDER — FLUCONAZOLE 150 MG PO TABS: ORAL_TABLET | ORAL | 0 refills | Status: DC

## 2018-03-18 NOTE — Telephone Encounter (Signed)
Called patient, demo verified, results communicated patient verbalized understanding.Test for candida (yeast) was positive.  Prescription for fluconazole 150mg  po now, repeat dose in 3d if needed, #2 no refills, sent to the pharmacy of record.  Recheck or followup with PCP for further evaluation if symptoms are not improving.

## 2018-09-10 ENCOUNTER — Telehealth: Payer: Medicaid Other | Admitting: Family

## 2018-09-10 DIAGNOSIS — R05 Cough: Secondary | ICD-10-CM

## 2018-09-10 DIAGNOSIS — R059 Cough, unspecified: Secondary | ICD-10-CM

## 2018-09-10 MED ORDER — BENZONATATE 100 MG PO CAPS: 100.0000 mg | ORAL_CAPSULE | Freq: Three times a day (TID) | ORAL | 0 refills | Status: DC | PRN

## 2018-09-10 NOTE — Progress Notes (Signed)
E-Visit for Corona Virus Screening  Based on your current symptoms, it seems unlikely that your symptoms are related to the Coronavirus.   Approximately 5 minutes was spent documenting and reviewing patient's chart.   Coronavirus disease 2019 (COVID-19) is a respiratory illness that can spread from person to person. The virus that causes COVID-19 is a new virus that was first identified in the country of [place] but is now found in multiple other countries and has spread to the [place].  Symptoms associated with the virus are mild to severe fever, cough, and shortness of breath. There is currently no vaccine to protect against COVID-19, and there is no specific antiviral treatment for the virus.   To be considered HIGH RISK for Coronavirus (COVID-19), you have to meet the following criteria:  . Traveled to [place], [place], [place], [place] or [place]; or in the [place] to [place], [place], [place], or [place]; and have fever, cough, and shortness of breath within the last 2 weeks of travel OR  . Been in close contact with a person diagnosed with COVID-19 within the last 2 weeks and have fever, cough, and shortness of breath  . IF YOU DO NOT MEET THESE CRITERIA, YOU ARE CONSIDERED LOW RISK FOR COVID-19.   It is vitally important that if you feel that you have an infection such as this virus or any other virus that you stay home and away from places where you may spread it to others.  You should self-quarantine for 14 days if you have symptoms that could potentially be coronavirus and avoid contact with people age 65 and older.   You can use medication such as A prescription cough medication called Tessalon Perles 100 mg. You may take 1-2 capsules every 8 hours as needed for cough  You may also take acetaminophen (Tylenol) as needed for fever.   Reduce your risk of any infection by using the same precautions used for avoiding the common cold or flu:  . Wash your hands often with  soap and warm water for at least 20 seconds.  If soap and water are not readily available, use an alcohol-based hand sanitizer with at least 60% alcohol.  . If coughing or sneezing, cover your mouth and nose by coughing or sneezing into the elbow areas of your shirt or coat, into a tissue or into your sleeve (not your hands). . Avoid shaking hands with others and consider head nods or verbal greetings only. . Avoid touching your eyes, nose, or mouth with unwashed hands.  . Avoid close contact with people who are sick. . Avoid places or events with large numbers of people in one location, like concerts or sporting events. . Carefully consider travel plans you have or are making. . If you are planning any travel outside or inside the [place], visit the CDC's Travelers' Health webpage for the latest health notices. . If you have some symptoms but not all symptoms, continue to monitor at home and seek medical attention if your symptoms worsen. . If you are having a medical emergency, call 911.  HOME CARE . Only take medications as instructed by your medical team. . Drink plenty of fluids and get plenty of rest. . A steam or ultrasonic humidifier can help if you have congestion.   GET HELP RIGHT AWAY IF: . You develop worsening fever. . You become short of breath . You cough up blood. . Your symptoms become more severe MAKE SURE YOU     Understand these instructions.  Will watch your condition.  Will get help right away if you are not doing well or get worse.  Your e-visit answers were reviewed by a board certified advanced clinical practitioner to complete your personal care plan.  Depending on the condition, your plan could have included both over the counter or prescription medications.  If there is a problem please reply once you have received a response from your provider. Your safety is important to us.  If you have drug allergies check your prescription carefully.    You can use MyChart to  ask questions about today's visit, request a non-urgent call back, or ask for a work or school excuse for 24 hours related to this e-Visit. If it has been greater than 24 hours you will need to follow up with your provider, or enter a new e-Visit to address those concerns. You will get an e-mail in the next two days asking about your experience.  I hope that your e-visit has been valuable and will speed your recovery. Thank you for using e-visits.    

## 2018-09-25 ENCOUNTER — Encounter: Payer: Self-pay | Admitting: *Deleted

## 2019-06-30 ENCOUNTER — Ambulatory Visit (HOSPITAL_COMMUNITY)
Admission: EM | Admit: 2019-06-30 | Discharge: 2019-06-30 | Disposition: A | Discharge status: Home / Self Care | Payer: Medicaid Other | Attending: Nurse Practitioner | Admitting: Nurse Practitioner

## 2019-06-30 ENCOUNTER — Encounter (HOSPITAL_COMMUNITY): Payer: Self-pay

## 2019-06-30 ENCOUNTER — Other Ambulatory Visit: Payer: Self-pay

## 2019-06-30 DIAGNOSIS — Z3202 Encounter for pregnancy test, result negative: Secondary | ICD-10-CM | POA: Diagnosis not present

## 2019-06-30 DIAGNOSIS — N760 Acute vaginitis: Secondary | ICD-10-CM | POA: Diagnosis present

## 2019-06-30 LAB — POCT URINALYSIS DIP (DEVICE)
Bilirubin Urine: NEGATIVE
Glucose, UA: NEGATIVE mg/dL
Ketones, ur: NEGATIVE mg/dL
Nitrite: NEGATIVE
Protein, ur: NEGATIVE mg/dL
Specific Gravity, Urine: >=1.03 (ref 1.005–1.030)
Urobilinogen, UA: 0.2 mg/dL (ref 0.0–1.0)
pH: 6 (ref 5.0–8.0)

## 2019-06-30 LAB — POC URINE PREG, ED
Preg Test, Ur: NEGATIVE
Preg Test, Ur: NEGATIVE

## 2019-06-30 LAB — POCT PREGNANCY, URINE: Preg Test, Ur: NEGATIVE

## 2019-06-30 MED ORDER — METRONIDAZOLE 500 MG PO TABS: 500.0000 mg | ORAL_TABLET | Freq: Two times a day (BID) | ORAL | 0 refills | Status: DC

## 2019-06-30 MED ORDER — FLUCONAZOLE 150 MG PO TABS: 150.0000 mg | ORAL_TABLET | ORAL | 0 refills | Status: AC

## 2019-06-30 NOTE — ED Provider Notes (Signed)
[facility]    CSN: [id number] Arrival date & time: 06/30/19  1805      History   Chief Complaint Chief Complaint  Patient presents with  . BV    HPI Lori Baldwin is a 24 y.o. female.   Subjective:   Lori Baldwin is a 24 y.o. female who presents for evaluation of vaginal itching, irritation, odor and vaginal discharge. She describes the discharge as white and thin. Symptoms have been present for 2 weeks. She noted some improvement after using OTC Monistat. She denies any dysuria, vaginal lesions, nausea, vomiting, abdominal pain, flank pain, back pain or fevers. Menstrual pattern regularly. LMP: 2 weeks ago. Contraception: none. She is sexually active with one female partner. She is not on any birth control and does not use condoms. She denies any concerns for STIs at the time. She has a history of gonorrhea and chlamydia.  The following portions of the patient's history were reviewed and updated as appropriate: allergies, current medications, past family history, past medical history, past social history, past surgical history and problem list.       Past Medical History:  Diagnosis Date  . Anemia   . BV (bacterial vaginosis)   . Gonorrhea   . Headache   . Pharyngitis   . Yeast vaginitis     Patient Active Problem List   Diagnosis Date Noted  . Abdominal pain in pregnancy, first trimester 06/01/2017    Past Surgical History:  Procedure Laterality Date  . CESAREAN SECTION      OB History    Gravida  2   Para  1   Term  1   Preterm      AB      Living  1     SAB      TAB      Ectopic      Multiple      Live Births  1            Home Medications    Prior to Admission medications   Medication Sig Start Date End Date Taking? Authorizing Provider  fluconazole (DIFLUCAN) 150 MG tablet Take 1 tablet (150 mg total) by mouth every 3 (three) days for 2 doses. 06/30/19 07/04/19  Lurline Idol, FNP  metroNIDAZOLE (FLAGYL) 500 MG  tablet Take 1 tablet (500 mg total) by mouth 2 (two) times daily. 06/30/19   Lurline Idol, FNP    Family History Family History  Problem Relation Age of Onset  . Cancer Maternal Grandmother        breast  . Sickle cell anemia Paternal Grandmother   . Hypertension Mother   . Diabetes Maternal Grandfather     Social History Social History   Tobacco Use  . Smoking status: Never Smoker  . Smokeless tobacco: Never Used  Substance Use Topics  . Alcohol use: No  . Drug use: Yes    Types: Marijuana    Comment: last was early Dec     Allergies   Aspirin and Tylenol [acetaminophen]   Review of Systems Review of Systems  Genitourinary: Positive for dysuria and vaginal discharge. Negative for flank pain and genital sores.  Musculoskeletal: Negative for back pain.  All other systems reviewed and are negative.    Physical Exam Triage Vital Signs ED Triage Vitals  Enc Vitals Group     BP 06/30/19 1833 (!) 96/57     Pulse Rate 06/30/19 1833 81     Resp 06/30/19 1833  18     Temp 06/30/19 1833 98.8 F (37.1 C)     Temp Source 06/30/19 1833 Oral     SpO2 06/30/19 1833 100 %     Weight --      Height --      Head Circumference --      Peak Flow --      Pain Score 06/30/19 1834 0     Pain Loc --      Pain Edu? --      Excl. in [place]? --    No data found.  Updated Vital Signs BP (!) 96/57 (BP Location: Right Arm)   Pulse 81   Temp 98.8 F (37.1 C) (Oral)   Resp 18   LMP 06/21/2019   SpO2 100%   Visual Acuity Right Eye Distance:   Left Eye Distance:   Bilateral Distance:    Right Eye Near:   Left Eye Near:    Bilateral Near:     Physical Exam Vitals reviewed.  Constitutional:      Appearance: Normal appearance.  HENT:     Head: Normocephalic.  Cardiovascular:     Rate and Rhythm: Normal rate and regular rhythm.  Pulmonary:     Effort: Pulmonary effort is normal.     Breath sounds: Normal breath sounds.  Musculoskeletal:        General: Normal range  of motion.  Skin:    General: Skin is warm and dry.  Neurological:     General: No focal deficit present.     Mental Status: She is alert.      UC Treatments / Results  Labs (all labs ordered are listed, but only abnormal results are displayed) Labs Reviewed  POCT URINALYSIS DIP (DEVICE) - Abnormal; Notable for the following components:      Result Value   Hgb urine dipstick TRACE (*)    Leukocytes,Ua SMALL (*)    All other components within normal limits  POC URINE PREG, ED  POCT PREGNANCY, URINE  POC URINE PREG, ED  CERVICOVAGINAL ANCILLARY ONLY    EKG   Radiology No results found.  Procedures Procedures (including critical care time)  Medications Ordered in UC Medications - No data to display  Initial Impression / Assessment and Plan / UC Course  I have reviewed the triage vital signs and the nursing notes.  Pertinent labs & imaging results that were available during my care of the patient were reviewed by me and considered in my medical decision making (see chart for details).    24 yo female presenting with vaginal itching, irritation, odor and vaginal discharge. Urine pregnancy and UA unremarkable. Studies for gonorrhea, chlamydia, BV and yeast pending.  Will treat with Flagyl and Diflucan for now.  Abstinence from intercourse discussed. Discussed safe sex.   Today's evaluation has revealed no signs of a dangerous process. Discussed diagnosis with patient and/or guardian. Patient and/or guardian aware of their diagnosis, possible red flag symptoms to watch out for and need for close follow up. Patient and/or guardian understands verbal and written discharge instructions. Patient and/or guardian comfortable with plan and disposition.  Patient and/or guardian has a clear mental status at this time, good insight into illness (after discussion and teaching) and has clear judgment to make decisions regarding their care  This care was provided during an unprecedented  National Emergency due to the Novel Coronavirus (COVID-19) pandemic. COVID-19 infections and transmission risks place heavy strains on healthcare resources.  As this pandemic evolves,  our facility, providers, and staff strive to respond fluidly, to remain operational, and to provide care relative to available resources and information. Outcomes are unpredictable and treatments are without well-defined guidelines. Further, the impact of COVID-19 on all aspects of urgent care, including the impact to patients seeking care for reasons other than COVID-19, is unavoidable during this national emergency. At this time of the global pandemic, management of patients has significantly changed, even for non-COVID positive patients given high local and regional COVID volumes at this time requiring high healthcare system and resource utilization. The standard of care for management of both COVID suspected and non-COVID suspected patients continues to change rapidly at the local, regional, national, and global levels. This patient was worked up and treated to the best available but ever changing evidence and resources available at this current time.   Documentation was completed with the aid of voice recognition software. Transcription may contain typographical errors.   Final Clinical Impressions(s) / UC Diagnoses   Final diagnoses:  Acute vaginitis   Discharge Instructions   None    ED Prescriptions    Medication Sig Dispense Auth. Provider   metroNIDAZOLE (FLAGYL) 500 MG tablet Take 1 tablet (500 mg total) by mouth 2 (two) times daily. 14 tablet Lurline Idol, FNP   fluconazole (DIFLUCAN) 150 MG tablet Take 1 tablet (150 mg total) by mouth every 3 (three) days for 2 doses. 2 tablet Lurline Idol, FNP     PDMP not reviewed this encounter.   Lurline Idol, [place] 06/30/19 1919

## 2019-06-30 NOTE — ED Triage Notes (Signed)
Pt presents with vaginal irritation and vaginal odor X 1 week.  

## 2019-07-02 ENCOUNTER — Encounter (HOSPITAL_COMMUNITY): Payer: Self-pay

## 2019-07-02 ENCOUNTER — Telehealth (HOSPITAL_COMMUNITY): Payer: Self-pay | Admitting: Emergency Medicine

## 2019-07-02 LAB — CERVICOVAGINAL ANCILLARY ONLY
Bacterial vaginitis: POSITIVE — AB
Candida vaginitis: NEGATIVE
Chlamydia: NEGATIVE
Neisseria Gonorrhea: NEGATIVE
Trichomonas: POSITIVE — AB

## 2019-07-02 NOTE — Telephone Encounter (Signed)
Bacterial Vaginosis test is positive.  Prescription for metronidazole was given at the urgent care visit.  Trichomonas is positive. Rx metronidazole was given at the urgent care visit. Pt needs education to please refrain from sexual intercourse for 7 days to give the medicine time to work. Sexual partners need to be notified and tested/treated. Condoms may reduce risk of reinfection. Recheck for further evaluation if symptoms are not improving.   Attempted to reach patient. No answer at this time. Voicemail left.   If you have any questions, you may call me at [phone]    

## 2019-07-14 IMAGING — US US OB TRANSVAGINAL
1 series · 15 of 28 positions shown · non-contrast
Comparison: Obstetric ultrasound June 03, 2007

ADDENDUM:
No free fluid demonstrated on today's sonogram.

Acute findings discussed with and reconfirmed by [email or web address]
TRUE on 06/05/2017 at [DATE].
CLINICAL DATA: Follow-up RIGHT adnexal mass, possible ectopic
pregnancy. Beta HCG 857, previously 706. Gestational age by last
menstrual period 5 weeks and 4 days.
EXAM:
TRANSVAGINAL OB ULTRASOUND
TECHNIQUE: Transvaginal ultrasound was performed for complete evaluation of the
gestation as well as the maternal uterus, adnexal regions, and
pelvic cul-de-sac.

[Series 1: us ob transvaginal · 43 acquisitions, 15 frames shown]
[im 1/43]
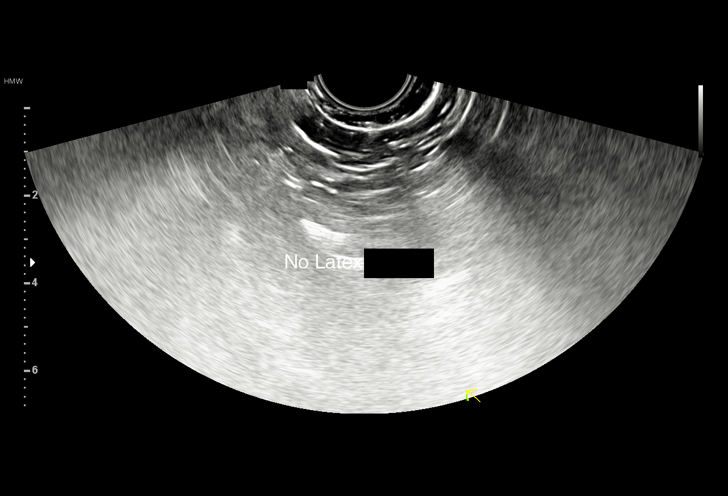
[im 4/43]
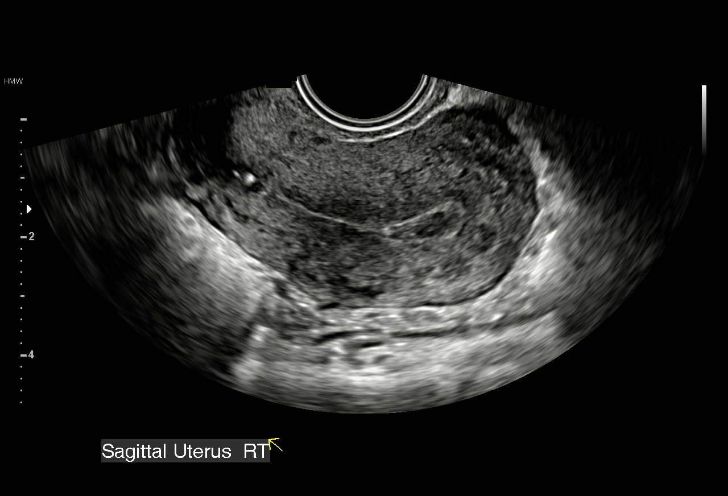
[im 7/43]
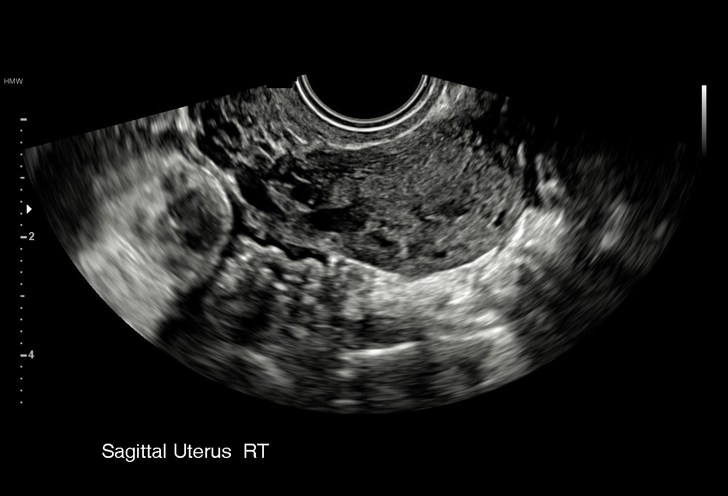
[im 10/43]
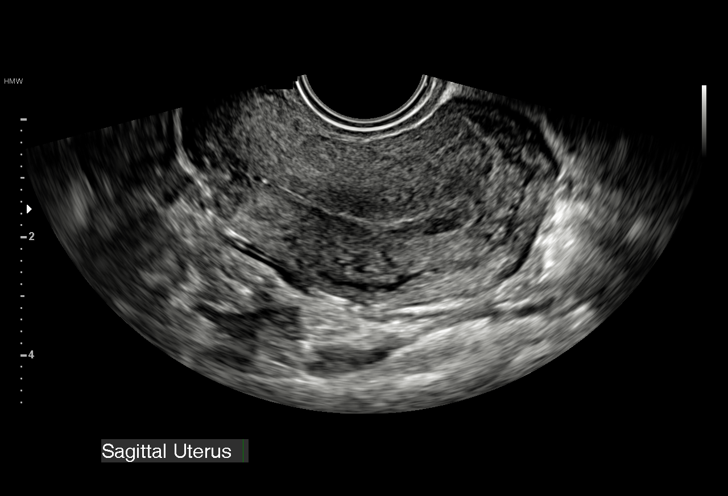
[im 13/43]
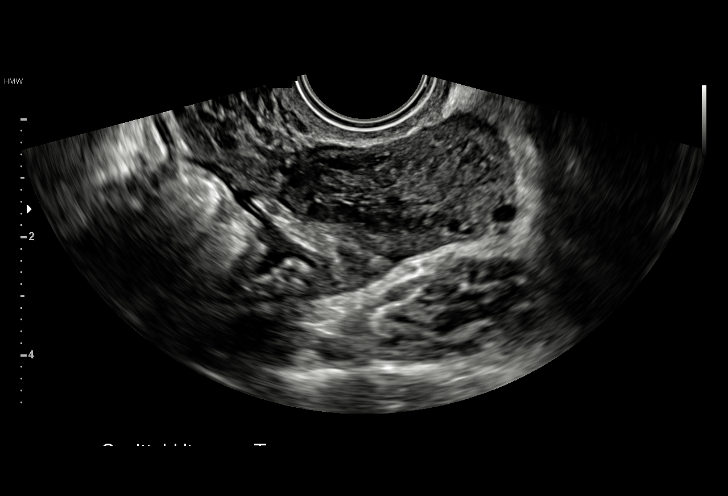
[im 16/43]
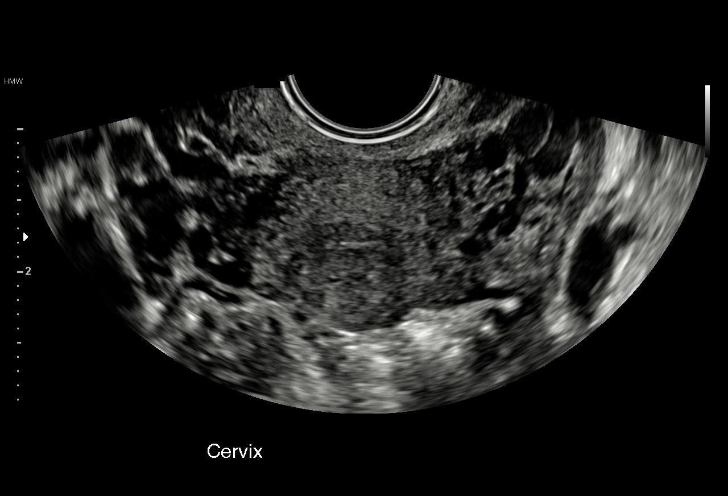
[im 19/43]
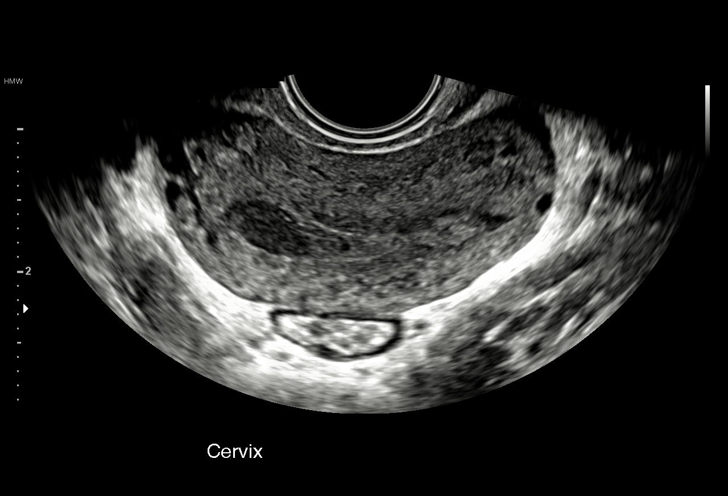
[im 22/43]
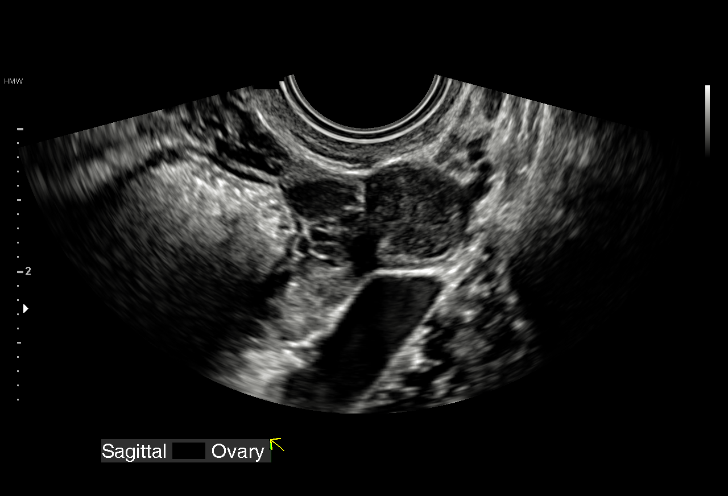
[im 24/43]
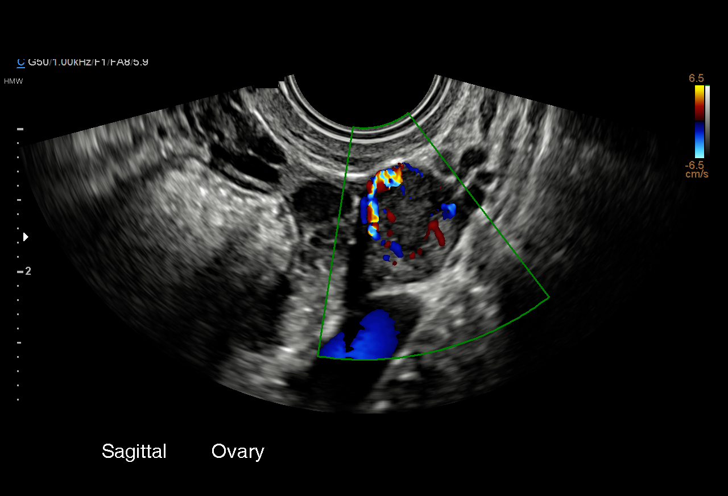
[im 27/43]
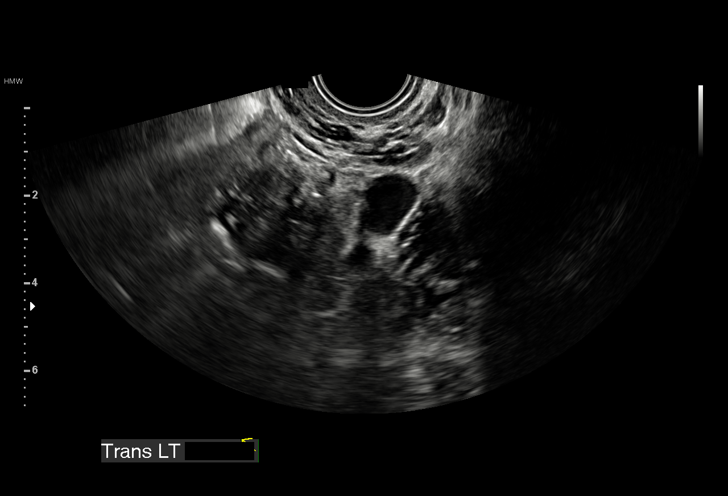
[im 30/43]
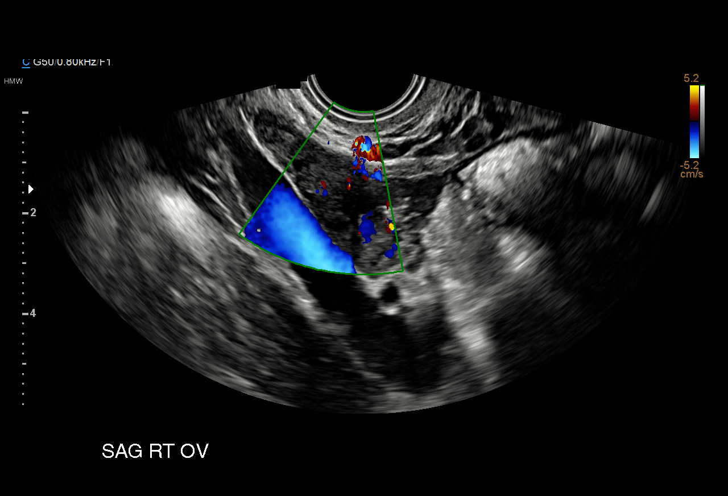
[im 33/43]
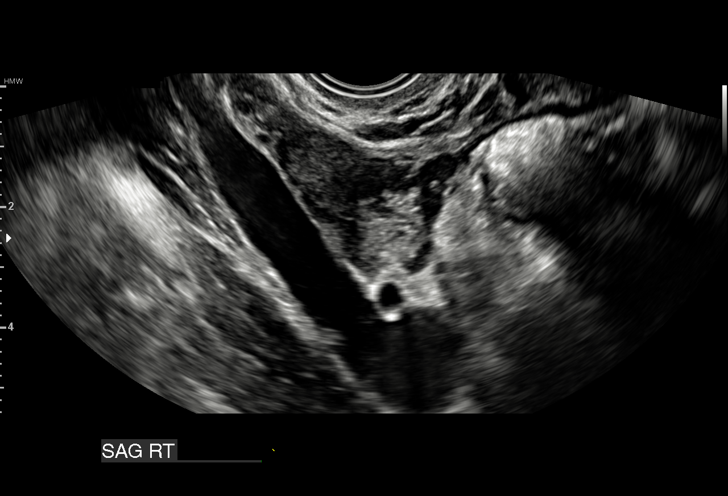
[im 36/43]
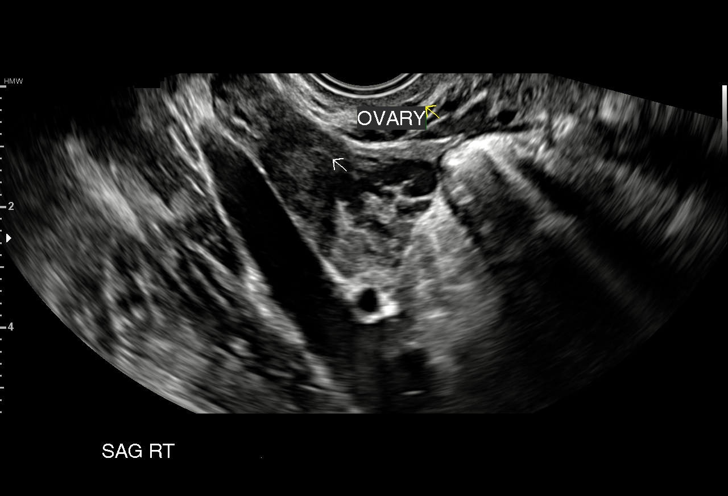
[im 39/43]
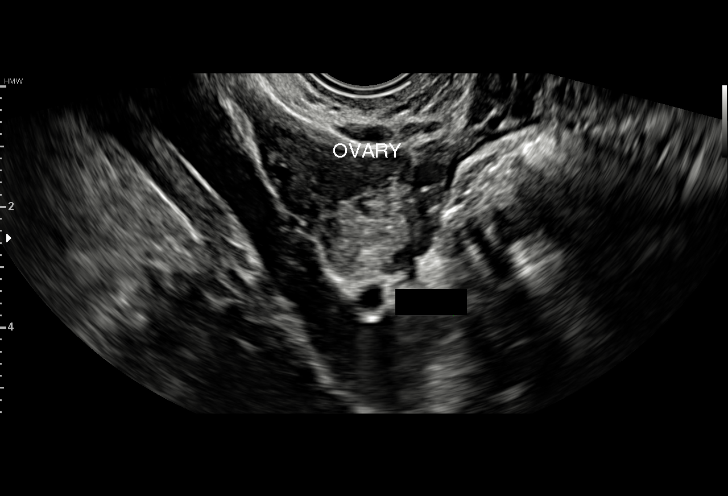
[im 43/43]
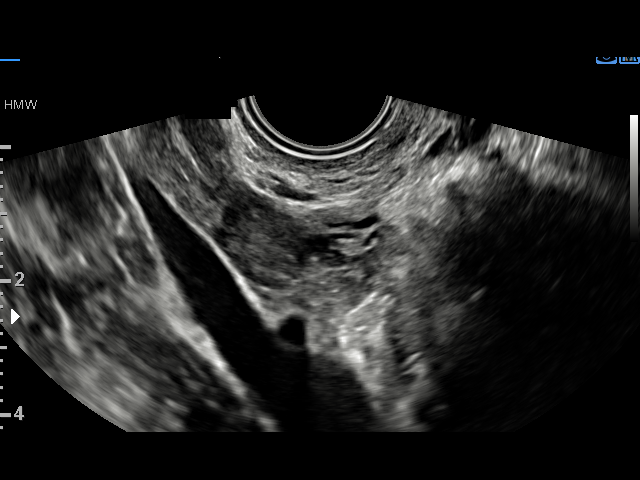

[15 of 28 positions shown; findings below may reference images not displayed]

FINDINGS: Intrauterine gestational sac: Not present; increased amount of fluid
within the endometrium.

Yolk sac:  Not present.

Embryo:  Not present.

Cardiac Activity: Not applicable.

Subchorionic hemorrhage:  None visualized.

Maternal uterus/adnexae: 1.7 x 1.6 x 1.5 cm complex RIGHT para
ovarian mass with peripheral vascularity increased in size from
prior imaging.
IMPRESSION: Increased size of RIGHT para ovarian mass concerning for ectopic
pregnancy. Please note, with beta HCG of less than [DATE],
intrauterine pregnancy may not be sonographically evident though,
heterotopic pregnancy is rare.

Increasing volume fluid within the endometrium.

## 2019-08-08 ENCOUNTER — Other Ambulatory Visit: Payer: Self-pay

## 2019-08-08 ENCOUNTER — Encounter (HOSPITAL_COMMUNITY): Payer: Self-pay

## 2019-08-08 ENCOUNTER — Emergency Department (HOSPITAL_COMMUNITY)
Admission: EM | Admit: 2019-08-08 | Discharge: 2019-08-08 | Disposition: A | Discharge status: Home / Self Care | Payer: Medicaid Other | Attending: Emergency Medicine | Admitting: Emergency Medicine

## 2019-08-08 ENCOUNTER — Emergency Department (HOSPITAL_COMMUNITY): Payer: Medicaid Other

## 2019-08-08 DIAGNOSIS — R1084 Generalized abdominal pain: Secondary | ICD-10-CM

## 2019-08-08 DIAGNOSIS — R110 Nausea: Secondary | ICD-10-CM

## 2019-08-08 DIAGNOSIS — R197 Diarrhea, unspecified: Secondary | ICD-10-CM | POA: Diagnosis not present

## 2019-08-08 DIAGNOSIS — N730 Acute parametritis and pelvic cellulitis: Secondary | ICD-10-CM

## 2019-08-08 DIAGNOSIS — N739 Female pelvic inflammatory disease, unspecified: Secondary | ICD-10-CM | POA: Diagnosis not present

## 2019-08-08 DIAGNOSIS — R1033 Periumbilical pain: Secondary | ICD-10-CM

## 2019-08-08 LAB — COMPREHENSIVE METABOLIC PANEL
ALT: 17 U/L (ref 0–44)
AST: 25 U/L (ref 15–41)
Albumin: 4.6 g/dL (ref 3.5–5.0)
Alkaline Phosphatase: 52 U/L (ref 38–126)
Anion gap: 11 (ref 5–15)
BUN: 14 mg/dL (ref 6–20)
CO2: 22 mmol/L (ref 22–32)
Calcium: 9.1 mg/dL (ref 8.9–10.3)
Chloride: 102 mmol/L (ref 98–111)
Creatinine, Ser: 0.72 mg/dL (ref 0.44–1.00)
GFR calc Af Amer: >60 mL/min (ref >60)
GFR calc non Af Amer: >60 mL/min (ref >60)
Glucose, Bld: 142 mg/dL — ABNORMAL HIGH (ref 70–99)
Potassium: 3.6 mmol/L (ref 3.5–5.1)
Sodium: 135 mmol/L (ref 135–145)
Total Bilirubin: 1 mg/dL (ref 0.3–1.2)
Total Protein: 8.6 g/dL — ABNORMAL HIGH (ref 6.5–8.1)

## 2019-08-08 LAB — WET PREP, GENITAL
Sperm: NONE SEEN
Trich, Wet Prep: NONE SEEN

## 2019-08-08 LAB — LIPASE, BLOOD: Lipase: 19 U/L (ref 11–51)

## 2019-08-08 LAB — CBC
HCT: 38.6 % (ref 36.0–46.0)
Hemoglobin: 12.5 g/dL (ref 12.0–15.0)
MCH: 27.7 pg (ref 26.0–34.0)
MCHC: 32.4 g/dL (ref 30.0–36.0)
MCV: 85.4 fL (ref 80.0–100.0)
Platelets: 248 10*3/uL (ref 150–400)
RBC: 4.52 MIL/uL (ref 3.87–5.11)
RDW: 14.2 % (ref 11.5–15.5)
WBC: 16.3 10*3/uL — ABNORMAL HIGH (ref 4.0–10.5)
nRBC: 0 % (ref 0.0–0.2)

## 2019-08-08 LAB — URINALYSIS, ROUTINE W REFLEX MICROSCOPIC
Bilirubin Urine: NEGATIVE
Glucose, UA: NEGATIVE mg/dL
Hgb urine dipstick: NEGATIVE
Ketones, ur: 80 mg/dL — AB
Leukocytes,Ua: NEGATIVE
Nitrite: NEGATIVE
Protein, ur: 30 mg/dL — AB
Specific Gravity, Urine: 1.031 — ABNORMAL HIGH (ref 1.005–1.030)
pH: 6 (ref 5.0–8.0)

## 2019-08-08 LAB — HCG, QUANTITATIVE, PREGNANCY: hCG, Beta Chain, Quant, S: <1 m[IU]/mL (ref <5)

## 2019-08-08 MED ORDER — IOHEXOL 300 MG/ML  SOLN
100.0000 mL | Freq: Once | INTRAMUSCULAR | Status: AC | PRN
  Administered 2019-08-08: 80 mL via INTRAVENOUS

## 2019-08-08 MED ORDER — MORPHINE SULFATE (PF) 4 MG/ML IV SOLN
4.0000 mg | Freq: Once | INTRAVENOUS | Status: AC
  Administered 2019-08-08: 4 mg via INTRAVENOUS
  Filled 2019-08-08: qty 1

## 2019-08-08 MED ORDER — SODIUM CHLORIDE 0.9 % IV BOLUS
1000.0000 mL | Freq: Once | INTRAVENOUS | Status: AC
  Administered 2019-08-08: 1000 mL via INTRAVENOUS

## 2019-08-08 MED ORDER — METRONIDAZOLE 500 MG PO TABS
500.0000 mg | ORAL_TABLET | Freq: Once | ORAL | Status: AC
  Administered 2019-08-08: 500 mg via ORAL
  Filled 2019-08-08: qty 1

## 2019-08-08 MED ORDER — DOXYCYCLINE HYCLATE 100 MG PO CAPS: 100.0000 mg | ORAL_CAPSULE | Freq: Two times a day (BID) | ORAL | 0 refills | Status: AC

## 2019-08-08 MED ORDER — SODIUM CHLORIDE (PF) 0.9 % IJ SOLN
INTRAMUSCULAR | Status: AC
  Filled 2019-08-08: qty 50

## 2019-08-08 MED ORDER — SODIUM CHLORIDE 0.9 % IV SOLN: Freq: Once | INTRAVENOUS | Status: AC

## 2019-08-08 MED ORDER — DICYCLOMINE HCL 10 MG PO CAPS
20.0000 mg | ORAL_CAPSULE | Freq: Once | ORAL | Status: AC
  Administered 2019-08-08: 20 mg via ORAL
  Filled 2019-08-08: qty 2

## 2019-08-08 MED ORDER — DEXTROSE 5 % IV SOLN: 500.0000 mg | Freq: Once | INTRAVENOUS | Status: DC

## 2019-08-08 MED ORDER — DOXYCYCLINE HYCLATE 100 MG PO TABS
100.0000 mg | ORAL_TABLET | Freq: Once | ORAL | Status: AC
  Administered 2019-08-08: 100 mg via ORAL
  Filled 2019-08-08: qty 1

## 2019-08-08 MED ORDER — ONDANSETRON HCL 4 MG/2ML IJ SOLN
4.0000 mg | Freq: Once | INTRAMUSCULAR | Status: AC
  Administered 2019-08-08: 4 mg via INTRAVENOUS
  Filled 2019-08-08: qty 2

## 2019-08-08 MED ORDER — ONDANSETRON 4 MG PO TBDP: 4.0000 mg | ORAL_TABLET | Freq: Three times a day (TID) | ORAL | 0 refills | Status: DC | PRN

## 2019-08-08 MED ORDER — SODIUM CHLORIDE 0.9% FLUSH: 3.0000 mL | Freq: Once | INTRAVENOUS | Status: DC

## 2019-08-08 MED ORDER — DICYCLOMINE HCL 20 MG PO TABS: 20.0000 mg | ORAL_TABLET | Freq: Two times a day (BID) | ORAL | 0 refills | Status: DC

## 2019-08-08 MED ORDER — METRONIDAZOLE 500 MG PO TABS: 500.0000 mg | ORAL_TABLET | Freq: Two times a day (BID) | ORAL | 0 refills | Status: AC

## 2019-08-08 MED ORDER — CEFTRIAXONE SODIUM 1 G IJ SOLR
500.0000 mg | Freq: Once | INTRAMUSCULAR | Status: AC
  Administered 2019-08-08: 500 mg via INTRAMUSCULAR
  Filled 2019-08-08: qty 10

## 2019-08-08 NOTE — Discharge Instructions (Addendum)
No sexual activity for 2 weeks; Treat all partners who had sex with patient during previous 60 days prior to symptom onset.  Please take all antibiotics as prescribed.  Please use Zofran as needed for nausea.  You have been prescribed antibiotics for 2 weeks.  Please take as prescribed.  Please follow-up with your PCP.

## 2019-08-08 NOTE — ED Triage Notes (Signed)
Pt arrived via [facility] from home CC Abd pain since this morning at 0200. Pt reports " blood in vomit". Per EMS one episode of emesis in route w/o blood. Pt also reports Diarrhea 2 days. PT denies possibility of being pregnant.    VSS Afebrile

## 2019-08-08 NOTE — ED Provider Notes (Signed)
[facility]-EMERGENCY DEPT Provider Note   CSN: [id number] Arrival date & time: 08/08/19  1119     History Chief Complaint  Patient presents with  . Abdominal Pain    Lori Baldwin is a 24 y.o. female.  HPI  Patient is a 24 year old female with a history of anemia, ED, migraines, ectopic pregnancy 1 year ago, and gonorrhea.  Patient is here today for umbilical abdominal pain that is constant, began this morning at 2 AM associated with nausea, vomiting and inability to tolerate p.o.  She states she has decreased appetite.  She also reports that she has had diarrhea for 2 days.  She states that her stool is watery but denies any dark tarry appearance.  She denies any flank pain, urinary symptoms, vaginal discharge, vaginal pain, frequency or urgency.  She also denies any recent sexual activity in the last 3 months.  She states that she has in the past had she has been diseases.  Patient states her last bowel movement was just before arriving in ED.  She states that she also noticed some blood in her vomit earlier this morning however states that it was streaks of blood.  Denies any large-volume hematemesis.  Per EMS patient had one episode of nonbloody nonbilious emesis during transportation.  Patient states that she did drink some alcohol on Tuesday present usual for her but denies any alcohol consumption since that time.  Denies any Tylenol, aspirin or ibuprofen use within the last two weeks.   Patient denies any headache, dizziness, lightheadedness.  She denies any chest pain or shortness of breath.   Left menstrual period was 2 weeks ago.     Past Medical History:  Diagnosis Date  . Anemia   . BV (bacterial vaginosis)   . Gonorrhea   . Headache   . Pharyngitis   . Yeast vaginitis     Patient Active Problem List   Diagnosis Date Noted  . Abdominal pain in pregnancy, first trimester 06/01/2017    Past Surgical History:  Procedure Laterality Date  .  CESAREAN SECTION       OB History    Gravida  2   Para  1   Term  1   Preterm      AB      Living  1     SAB      TAB      Ectopic      Multiple      Live Births  1           Family History  Problem Relation Age of Onset  . Cancer Maternal Grandmother        breast  . Sickle cell anemia Paternal Grandmother   . Hypertension Mother   . Diabetes Maternal Grandfather     Social History   Tobacco Use  . Smoking status: Never Smoker  . Smokeless tobacco: Never Used  Substance Use Topics  . Alcohol use: No  . Drug use: Yes    Types: Marijuana    Comment: last was early Dec    Home Medications Prior to Admission medications   Medication Sig Start Date End Date Taking? Authorizing Provider  dicyclomine (BENTYL) 20 MG tablet Take 1 tablet (20 mg total) by mouth 2 (two) times daily. 08/08/19   Gailen Shelter, PA  doxycycline (VIBRAMYCIN) 100 MG capsule Take 1 capsule (100 mg total) by mouth 2 (two) times daily for 14 days. 08/08/19 08/22/19  Solon Augusta  S, PA  metroNIDAZOLE (FLAGYL) 500 MG tablet Take 1 tablet (500 mg total) by mouth 2 (two) times daily for 14 days. 08/08/19 08/22/19  Gailen Shelter, PA  ondansetron (ZOFRAN ODT) 4 MG disintegrating tablet Take 1 tablet (4 mg total) by mouth every 8 (eight) hours as needed for nausea or vomiting. 08/08/19   Gailen Shelter, PA    Allergies    Aspirin and Tylenol [acetaminophen]  Review of Systems   Review of Systems  Constitutional: Positive for fatigue. Negative for chills and fever.  HENT: Negative for congestion.   Eyes: Negative for pain.  Respiratory: Negative for cough and shortness of breath.   Cardiovascular: Negative for chest pain and leg swelling.  Gastrointestinal: Positive for abdominal pain, diarrhea, nausea and vomiting.  Genitourinary: Negative for decreased urine volume, dysuria, pelvic pain, urgency, vaginal bleeding and vaginal discharge.  Musculoskeletal: Negative for myalgias.    Skin: Negative for rash.  Neurological: Negative for dizziness and headaches.    Physical Exam Updated Vital Signs BP 106/77   Pulse 82   Temp 97.8 F (36.6 C) (Oral)   Resp 16   LMP 07/25/2019   SpO2 99%   Physical Exam Vitals and nursing note reviewed.  Constitutional:      General: She is in acute distress.     Appearance: She is not diaphoretic.     Comments: Patient is in acute distress.  She has complaining of severe pain.  She is nondiaphoretic and not ill-appearing however she is crying and writhing in bed.  HENT:     Head: Normocephalic and atraumatic.     Nose: Nose normal.     Mouth/Throat:     Mouth: Mucous membranes are dry.  Eyes:     General: No scleral icterus. Cardiovascular:     Rate and Rhythm: Normal rate and regular rhythm.     Pulses: Normal pulses.     Heart sounds: Normal heart sounds.  Pulmonary:     Effort: Pulmonary effort is normal. No respiratory distress.     Breath sounds: No wheezing.  Abdominal:     Palpations: Abdomen is soft.     Tenderness: There is abdominal tenderness (Mild tenderness palpation right lower quadrant.). There is no right CVA tenderness, left CVA tenderness, guarding or rebound.  Genitourinary:    Comments: Vulva without lesions or abnormality Vaginal canal without lesion, there is thick white discharge in vaginal canal. Cervix appears normal, is closed No adnexal tenderness or CMT Musculoskeletal:     Cervical back: Normal range of motion.     Right lower leg: No edema.     Left lower leg: No edema.  Skin:    General: Skin is warm and dry.     Capillary Refill: Capillary refill takes less than 2 seconds.  Neurological:     Mental Status: She is alert. Mental status is at baseline.  Psychiatric:        Mood and Affect: Mood normal.        Behavior: Behavior normal.     ED Results / Procedures / Treatments   Labs (all labs ordered are listed, but only abnormal results are displayed) Labs Reviewed  WET  PREP, GENITAL - Abnormal; Notable for the following components:      Result Value   Yeast Wet Prep HPF POC PRESENT (*)    Clue Cells Wet Prep HPF POC PRESENT (*)    WBC, Wet Prep HPF POC MANY (*)    All other  components within normal limits  COMPREHENSIVE METABOLIC PANEL - Abnormal; Notable for the following components:   Glucose, Bld 142 (*)    Total Protein 8.6 (*)    All other components within normal limits  CBC - Abnormal; Notable for the following components:   WBC 16.3 (*)    All other components within normal limits  URINALYSIS, ROUTINE W REFLEX MICROSCOPIC - Abnormal; Notable for the following components:   Specific Gravity, Urine 1.031 (*)    Ketones, ur 80 (*)    Protein, ur 30 (*)    Bacteria, UA RARE (*)    All other components within normal limits  LIPASE, BLOOD  HCG, QUANTITATIVE, PREGNANCY  GC/CHLAMYDIA PROBE AMP ([place]) NOT AT [facility]    EKG None  Radiology CT ABDOMEN PELVIS W CONTRAST  Result Date: 08/08/2019 CLINICAL DATA:  24 year old female with abdominal and pelvic pain and vomiting. EXAM: CT ABDOMEN AND PELVIS WITH CONTRAST TECHNIQUE: Multidetector CT imaging of the abdomen and pelvis was performed using the standard protocol following bolus administration of intravenous contrast. CONTRAST:  28mL OMNIPAQUE IOHEXOL 300 MG/ML  SOLN COMPARISON:  None. FINDINGS: Lower chest: Unremarkable Hepatobiliary: The liver and gallbladder are unremarkable. No biliary dilatation. Pancreas: Unremarkable Spleen: Small but otherwise unremarkable. Adrenals/Urinary Tract: The kidneys, adrenal glands and bladder are unremarkable. Stomach/Bowel: Stomach is within normal limits. What appears to be the appendix is normal. No evidence of bowel wall thickening, distention, or inflammatory changes. Vascular/Lymphatic: No significant vascular findings are present. No enlarged abdominal or pelvic lymph nodes. Reproductive: Uterus and bilateral adnexa are unremarkable except for a  retroverted uterus. Other: No ascites, focal collection or pneumoperitoneum. Musculoskeletal: No acute or significant osseous findings. IMPRESSION: No acute or significant abnormalities. Electronically Signed   By: Margarette Canada M.D.   On: 08/08/2019 17:24    Procedures Procedures (including critical care time)  Medications Ordered in ED Medications  sodium chloride (PF) 0.9 % injection (has no administration in time range)  0.9 %  sodium chloride infusion ( Intravenous Stopped 08/08/19 1650)  morphine 4 MG/ML injection 4 mg (4 mg Intravenous Given 08/08/19 1455)  ondansetron (ZOFRAN) injection 4 mg (4 mg Intravenous Given 08/08/19 1455)  dicyclomine (BENTYL) capsule 20 mg (20 mg Oral Given 08/08/19 1715)  sodium chloride 0.9 % bolus 1,000 mL (0 mLs Intravenous Stopped 08/08/19 1805)  metroNIDAZOLE (FLAGYL) tablet 500 mg (500 mg Oral Given 08/08/19 1715)  doxycycline (VIBRA-TABS) tablet 100 mg (100 mg Oral Given 08/08/19 1715)  ondansetron (ZOFRAN) injection 4 mg (4 mg Intravenous Given 08/08/19 1715)  cefTRIAXone (ROCEPHIN) injection 500 mg (500 mg Intramuscular Given 08/08/19 1803)  iohexol (OMNIPAQUE) 300 MG/ML solution 100 mL (80 mLs Intravenous Contrast Given 08/08/19 1659)    ED Course  I have reviewed the triage vital signs and the nursing notes.  Pertinent labs & imaging results that were available during my care of the patient were reviewed by me and considered in my medical decision making (see chart for details).   Patient is 24 year old female on initial evaluation is in acute distress, complaining of severe periumbilical abdominal pain.  She is screaming and thrashing around in bed initially.  However after 4 mg of Zofran and morphine she states that her symptoms completely abated.  At this point physical examination was repeated and is significant for periumbilical tenderness to palpation and some very mild right lower quadrant tenderness to palpation.  She decision-making conversation  with patient regarding inability to rule out appendicitis without CT scan.  She  does have mild leukocytosis and decreased appetite she does have concerning history and physical exam features that may indicate bursitis.  Will obtain CT abdomen pelvis with contrast.  Clinical Course as of Aug 07 1808  Sat Aug 08, 2019  1805 I discussed this case with my attending physician who cosigned this note including patient's presenting symptoms, physical exam, and planned diagnostics and interventions. Attending physician stated agreement with plan or made changes to plan which were implemented.   Attending physician assessed patient at bedside.   [WF]  1805 HCG WNLs, Lipase WNLs   [WF]  1806 CBC with leukocytosis. Likely demarginilzation d/t vomiting but some concern for leukocytosis 2/2 appendicitis   [WF]  1807 CMP unremarkable.    [WF]  1807 Urine sample is contaminated and does not appear acutely infected. No urinary sx per pt.    [WF]  9741 Patient sx completely resolved with morphine and zofran. No ep of emesis in ED visit.    [WF]  1809 Wet prep with evidence of Bv/yeast/WBCs present. Concern for cervicitis/vaginitis. Pelvic exam benign. Will treat for PID given symptoms and normal CT scan.    [WF]  1810 CT scan without acute abn. No explanation for sx today.   [WF]    Clinical Course User Index [WF] Gailen Shelter, [place]    Patient has many WBCs on wet prep and may be experiencing some form of vaginitis/cervicitis.  Will treat with azithromycin and Doxy and Flagyl for 2 weeks.  Patient has a history of STDs including gonorrhea, trichomonas, also has a history of BV.  Patient will also be discharged with Bentyl and Zofran for symptomatic treatment.  Patient is afebrile and does not require inpatient hospitalization.  Symptom control will likely be sufficient.  She will follow up with her PCP.  She continues to have no symptoms of pain and nausea after minimal intervention.  Patient given  strict return cautions for symptoms of appendicitis as this may be early appendicitis and she may not have developed CT changes at this time.  She is understanding of plan is able to teach back will return to ED if she has any new or concerning symptoms.  -----------------------------  The medical records were personally reviewed by myself. I personally reviewed all lab results and interpreted all imaging studies and either concurred with their official read or contacted radiology for clarification.   This patient appears reasonably screened and I doubt any other medical condition requiring further workup, evaluation, or treatment in the ED at this time prior to discharge.   Patient's vitals are WNL apart from vital sign abnormalities discussed above, patient is in NAD, and able to ambulate in the ED at their baseline and able to tolerate PO.  Pain has been managed or a plan has been made for home management and has no complaints prior to discharge. Patient is comfortable with above plan and for discharge at this time. All questions were answered prior to disposition. Results from the ER workup discussed with the patient face to face and all questions answered to the best of my ability. The patient is safe for discharge with strict return precautions. Patient appears safe for discharge with appropriate follow-up. Conveyed my impression with the patient and they voiced understanding and are agreeable to plan.   An After Visit Summary was printed and given to the patient.  Portions of this note were generated with Scientist, clinical (histocompatibility and immunogenetics). Dictation errors may occur despite best attempts at proofreading.  MDM Rules/Calculators/A&P                        Final Clinical Impression(s) / ED Diagnoses Final diagnoses:  Generalized abdominal pain  Periumbilical abdominal pain  PID (acute pelvic inflammatory disease)  Nausea  Diarrhea, unspecified type    Rx / DC Orders ED Discharge Orders          Ordered    ondansetron (ZOFRAN ODT) 4 MG disintegrating tablet  Every 8 hours PRN     08/08/19 1743    doxycycline (VIBRAMYCIN) 100 MG capsule  2 times daily     08/08/19 1743    metroNIDAZOLE (FLAGYL) 500 MG tablet  2 times daily     08/08/19 1743    dicyclomine (BENTYL) 20 MG tablet  2 times daily     08/08/19 1749           Gailen ShelterFondaw, Shareece Bultman S, [place] 08/08/19 2225    Linwood DibblesKnapp, Jon, MD 08/09/19 1244

## 2019-08-11 LAB — GC/CHLAMYDIA PROBE AMP (~~LOC~~) NOT AT ARMC
Chlamydia: NEGATIVE
Neisseria Gonorrhea: NEGATIVE

## 2019-11-12 ENCOUNTER — Encounter (HOSPITAL_COMMUNITY): Payer: Self-pay

## 2019-11-12 ENCOUNTER — Emergency Department (HOSPITAL_COMMUNITY)
Admission: EM | Admit: 2019-11-12 | Discharge: 2019-11-13 | Disposition: A | Discharge status: Home / Self Care | Payer: Medicaid Other | Attending: Emergency Medicine | Admitting: Emergency Medicine

## 2019-11-12 DIAGNOSIS — Z5321 Procedure and treatment not carried out due to patient leaving prior to being seen by health care provider: Secondary | ICD-10-CM | POA: Diagnosis not present

## 2019-11-12 DIAGNOSIS — Z041 Encounter for examination and observation following transport accident: Secondary | ICD-10-CM | POA: Insufficient documentation

## 2019-11-12 NOTE — ED Notes (Signed)
Pt has left.  ?
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
## 2020-02-05 ENCOUNTER — Ambulatory Visit (HOSPITAL_COMMUNITY)
Admission: EM | Admit: 2020-02-05 | Discharge: 2020-02-05 | Disposition: A | Discharge status: Home / Self Care | Payer: Medicaid Other | Attending: Urgent Care | Admitting: Urgent Care

## 2020-02-05 ENCOUNTER — Encounter (HOSPITAL_COMMUNITY): Payer: Self-pay

## 2020-02-05 ENCOUNTER — Other Ambulatory Visit: Payer: Self-pay

## 2020-02-05 DIAGNOSIS — R5381 Other malaise: Secondary | ICD-10-CM | POA: Insufficient documentation

## 2020-02-05 DIAGNOSIS — R42 Dizziness and giddiness: Secondary | ICD-10-CM | POA: Diagnosis not present

## 2020-02-05 DIAGNOSIS — R52 Pain, unspecified: Secondary | ICD-10-CM | POA: Diagnosis not present

## 2020-02-05 DIAGNOSIS — U071 COVID-19: Secondary | ICD-10-CM | POA: Insufficient documentation

## 2020-02-05 DIAGNOSIS — B349 Viral infection, unspecified: Secondary | ICD-10-CM | POA: Diagnosis not present

## 2020-02-05 DIAGNOSIS — R5383 Other fatigue: Secondary | ICD-10-CM | POA: Insufficient documentation

## 2020-02-05 DIAGNOSIS — Z79899 Other long term (current) drug therapy: Secondary | ICD-10-CM | POA: Diagnosis not present

## 2020-02-05 DIAGNOSIS — Z20822 Contact with and (suspected) exposure to covid-19: Secondary | ICD-10-CM

## 2020-02-05 DIAGNOSIS — R509 Fever, unspecified: Secondary | ICD-10-CM | POA: Insufficient documentation

## 2020-02-05 LAB — SARS CORONAVIRUS 2 (TAT 6-24 HRS): SARS Coronavirus 2: POSITIVE — AB

## 2020-02-05 MED ORDER — ACETAMINOPHEN 325 MG PO TABS
650.0000 mg | ORAL_TABLET | Freq: Once | ORAL | Status: AC
  Administered 2020-02-05: 650 mg via ORAL

## 2020-02-05 MED ORDER — ACETAMINOPHEN 325 MG PO TABS
ORAL_TABLET | ORAL | Status: AC
  Filled 2020-02-05: qty 2

## 2020-02-05 NOTE — ED Triage Notes (Signed)
Pt HA, dizziness, fever, chills, body aches started yesterday. Pt boyfriend was around COVID + person.

## 2020-02-05 NOTE — ED Provider Notes (Signed)
[facility]   MRN: [id number] DOB: 08-Jun-1995  Subjective:   Lori Baldwin is a 24 y.o. female presenting for 1 day history of acute onset body aches, chills, fever, headaches, dizziness.  Patient had very close exposure to COVID-19 through her baby's father.  Has not had COVID vaccination.  No current facility-administered medications for this encounter.  Current Outpatient Medications:  .  dicyclomine (BENTYL) 20 MG tablet, Take 1 tablet (20 mg total) by mouth 2 (two) times daily., Disp: 20 tablet, Rfl: 0 .  ondansetron (ZOFRAN ODT) 4 MG disintegrating tablet, Take 1 tablet (4 mg total) by mouth every 8 (eight) hours as needed for nausea or vomiting., Disp: 20 tablet, Rfl: 0   Allergies  Allergen Reactions  . Aspirin Itching    Pt states she does fine when she takes ibuprofen      Past Medical History:  Diagnosis Date  . Anemia   . BV (bacterial vaginosis)   . Gonorrhea   . Headache   . Pharyngitis   . Yeast vaginitis      Past Surgical History:  Procedure Laterality Date  . CESAREAN SECTION      Family History  Problem Relation Age of Onset  . Cancer Maternal Grandmother        breast  . Sickle cell anemia Paternal Grandmother   . Hypertension Mother   . Diabetes Maternal Grandfather     Social History   Tobacco Use  . Smoking status: Never Smoker  . Smokeless tobacco: Never Used  Substance Use Topics  . Alcohol use: No  . Drug use: Yes    Types: Marijuana    Comment: last was early Dec    ROS   Objective:   Vitals: BP 98/60   Pulse (!) 102   Temp 100 F (37.8 C) (Oral)   Resp 18   Ht 5\' 5"  (1.651 m)   Wt 100 lb (45.4 kg)   SpO2 99%   BMI 16.64 kg/m   Physical Exam Constitutional:      General: She is not in acute distress.    Appearance: Normal appearance. She is well-developed. She is not ill-appearing, toxic-appearing or diaphoretic.  HENT:     Head: Normocephalic and atraumatic.     Nose: Nose normal.      Mouth/Throat:     Mouth: Mucous membranes are moist.  Eyes:     Extraocular Movements: Extraocular movements intact.     Pupils: Pupils are equal, round, and reactive to light.  Cardiovascular:     Rate and Rhythm: Normal rate and regular rhythm.     Pulses: Normal pulses.     Heart sounds: Normal heart sounds. No murmur heard.  No friction rub. No gallop.   Pulmonary:     Effort: Pulmonary effort is normal. No respiratory distress.     Breath sounds: Normal breath sounds. No stridor. No wheezing, rhonchi or rales.  Skin:    General: Skin is warm and dry.     Findings: No rash.  Neurological:     Mental Status: She is alert and oriented to person, place, and time.  Psychiatric:        Mood and Affect: Mood normal. Mood is not anxious or depressed.        Speech: Speech normal.        Behavior: Behavior normal. Behavior is not agitated.        Thought Content: Thought content normal.  Cognition and Memory: Cognition is not impaired. Memory is not impaired.      Assessment and Plan :   PDMP not reviewed this encounter.  1. Viral syndrome   2. Body aches   3. Fever, unspecified   4. Malaise and fatigue   5. Dizziness   6. Close exposure to COVID-19 virus     Will manage for viral illness such as viral URI, viral syndrome, viral rhinitis, COVID-19. Counseled patient on nature of COVID-19 including modes of transmission, diagnostic testing, management and supportive care.  Offered scripts for symptomatic relief. COVID 19 testing is pending. Counseled patient on potential for adverse effects with medications prescribed/recommended today, ER and return-to-clinic precautions discussed, patient verbalized understanding.     Wallis Bamberg, PA-C 02/05/20 1359

## 2020-02-05 NOTE — Discharge Instructions (Addendum)

## 2020-02-06 ENCOUNTER — Ambulatory Visit (HOSPITAL_COMMUNITY)
Admission: RE | Admit: 2020-02-06 | Discharge: 2020-02-06 | Disposition: A | Discharge status: Home / Self Care | Payer: Medicaid Other | Source: Ambulatory Visit | Attending: Pulmonary Disease | Admitting: Pulmonary Disease

## 2020-02-06 ENCOUNTER — Encounter: Payer: Self-pay | Admitting: Family

## 2020-02-06 ENCOUNTER — Other Ambulatory Visit: Payer: Self-pay | Admitting: Family

## 2020-02-06 ENCOUNTER — Telehealth: Payer: Self-pay | Admitting: Family

## 2020-02-06 VITALS — BP 101/69 | HR 83 | Temp 100.0°F | Resp 16

## 2020-02-06 DIAGNOSIS — U071 COVID-19: Secondary | ICD-10-CM

## 2020-02-06 DIAGNOSIS — O26891 Other specified pregnancy related conditions, first trimester: Secondary | ICD-10-CM

## 2020-02-06 DIAGNOSIS — Z7189 Other specified counseling: Secondary | ICD-10-CM

## 2020-02-06 MED ORDER — ALBUTEROL SULFATE HFA 108 (90 BASE) MCG/ACT IN AERS: 2.0000 | INHALATION_SPRAY | Freq: Once | RESPIRATORY_TRACT | Status: DC | PRN

## 2020-02-06 MED ORDER — METHYLPREDNISOLONE SODIUM SUCC 125 MG IJ SOLR: 125.0000 mg | Freq: Once | INTRAMUSCULAR | Status: DC | PRN

## 2020-02-06 MED ORDER — DIPHENHYDRAMINE HCL 50 MG/ML IJ SOLN: 50.0000 mg | Freq: Once | INTRAMUSCULAR | Status: DC | PRN

## 2020-02-06 MED ORDER — EPINEPHRINE 0.3 MG/0.3ML IJ SOAJ: 0.3000 mg | Freq: Once | INTRAMUSCULAR | Status: DC | PRN

## 2020-02-06 MED ORDER — SODIUM CHLORIDE 0.9 % IV SOLN: INTRAVENOUS | Status: DC | PRN

## 2020-02-06 MED ORDER — FAMOTIDINE IN NACL 20-0.9 MG/50ML-% IV SOLN: 20.0000 mg | Freq: Once | INTRAVENOUS | Status: DC | PRN

## 2020-02-06 MED ORDER — SODIUM CHLORIDE 0.9 % IV SOLN
1200.0000 mg | Freq: Once | INTRAVENOUS | Status: AC
  Administered 2020-02-06: 1200 mg via INTRAVENOUS
  Filled 2020-02-06: qty 10

## 2020-02-06 NOTE — Telephone Encounter (Signed)
I connected by phone with Lori Baldwin on 02/06/2020 at 10:38 AM to discuss the potential use of a new treatment for mild to moderate COVID-19 viral infection in non-hospitalized patients.  This patient is a 24 y.o. female that meets the FDA criteria for Emergency Use Authorization of COVID monoclonal antibody casirivimab/imdevimab.  Has a (+) direct SARS-CoV-2 viral test result  Has mild or moderate COVID-19   Is NOT hospitalized due to COVID-19  Is within 10 days of symptom onset  Has at least one of the high risk factor(s) for progression to severe COVID-19 and/or hospitalization as defined in EUA.  Specific high risk criteria : Other high risk medical condition per CDC:  ethnicity   Symptoms onset 02/03/20. Positive test 02/05/20.   I have spoken and communicated the following to the patient or parent/caregiver regarding COVID monoclonal antibody treatment:  1. FDA has authorized the emergency use for the treatment of mild to moderate COVID-19 in adults and pediatric patients with positive results of direct SARS-CoV-2 viral testing who are 41 years of age and older weighing at least 40 kg, and who are at high risk for progressing to severe COVID-19 and/or hospitalization.  2. The significant known and potential risks and benefits of COVID monoclonal antibody, and the extent to which such potential risks and benefits are unknown.  3. Information on available alternative treatments and the risks and benefits of those alternatives, including clinical trials.  4. Patients treated with COVID monoclonal antibody should continue to self-isolate and use infection control measures (e.g., wear mask, isolate, social distance, avoid sharing personal items, clean and disinfect "high touch" surfaces, and frequent handwashing) according to CDC guidelines.   5. The patient or parent/caregiver has the option to accept or refuse COVID monoclonal antibody treatment.  After reviewing this information  with the patient, The patient agreed to proceed with receiving casirivimab\imdevimab infusion and will be provided a copy of the Fact sheet prior to receiving the infusion.   [name] 02/06/2020 10:38 AM

## 2020-02-06 NOTE — Progress Notes (Signed)
°  Diagnosis: COVID-19 ° °Physician: Dr. Wright ° °Procedure: Cov[facility] Infusion Clinic Med: casirivimab\imdevimab infusion - Provided patient with casirivimab\imdevimab fact sheet for patients, parents and caregivers prior to infusion. ° °Complications: No immediate complications noted. ° °Discharge: Discharged home  ° °Lori Baldwin E [place] °02/06/2020 ° ° °

## 2020-02-06 NOTE — Discharge Instructions (Signed)

## 2020-05-07 ENCOUNTER — Encounter (HOSPITAL_COMMUNITY): Payer: Self-pay

## 2020-05-07 ENCOUNTER — Ambulatory Visit (HOSPITAL_COMMUNITY)
Admission: EM | Admit: 2020-05-07 | Discharge: 2020-05-07 | Disposition: A | Discharge status: Home / Self Care | Payer: Medicaid Other | Attending: Physician Assistant | Admitting: Physician Assistant

## 2020-05-07 ENCOUNTER — Other Ambulatory Visit: Payer: Self-pay

## 2020-05-07 DIAGNOSIS — S46911A Strain of unspecified muscle, fascia and tendon at shoulder and upper arm level, right arm, initial encounter: Secondary | ICD-10-CM

## 2020-05-07 MED ORDER — NAPROXEN 500 MG PO TABS: 500.0000 mg | ORAL_TABLET | Freq: Two times a day (BID) | ORAL | 0 refills | Status: AC
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
## 2020-05-07 NOTE — Discharge Instructions (Signed)
Discontinue ibuprofen use May take up to 1000 mg tylenol every 6 to 8 hours as needed for pain. Apply ice and heat to shoulder 15 minutes 4 times per day Stretch shoulder daily.
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
## 2020-06-23 ENCOUNTER — Inpatient Hospital Stay (HOSPITAL_COMMUNITY): Payer: Medicaid Other

## 2020-06-23 ENCOUNTER — Inpatient Hospital Stay (HOSPITAL_COMMUNITY)
Admission: AD | Admit: 2020-06-23 | Discharge: 2020-06-23 | Disposition: A | Discharge status: Home / Self Care | Payer: Medicaid Other | Attending: Family Medicine | Admitting: Family Medicine

## 2020-06-23 ENCOUNTER — Other Ambulatory Visit: Payer: Self-pay

## 2020-06-23 ENCOUNTER — Encounter (HOSPITAL_COMMUNITY): Payer: Self-pay | Admitting: Family Medicine

## 2020-06-23 DIAGNOSIS — O3680X0 Pregnancy with inconclusive fetal viability, not applicable or unspecified: Secondary | ICD-10-CM | POA: Diagnosis not present

## 2020-06-23 DIAGNOSIS — R109 Unspecified abdominal pain: Secondary | ICD-10-CM | POA: Diagnosis present

## 2020-06-23 DIAGNOSIS — O0910 Supervision of pregnancy with history of ectopic or molar pregnancy, unspecified trimester: Secondary | ICD-10-CM | POA: Insufficient documentation

## 2020-06-23 DIAGNOSIS — O209 Hemorrhage in early pregnancy, unspecified: Secondary | ICD-10-CM | POA: Diagnosis not present

## 2020-06-23 DIAGNOSIS — Z886 Allergy status to analgesic agent status: Secondary | ICD-10-CM | POA: Insufficient documentation

## 2020-06-23 DIAGNOSIS — Z8759 Personal history of other complications of pregnancy, childbirth and the puerperium: Secondary | ICD-10-CM | POA: Diagnosis not present

## 2020-06-23 DIAGNOSIS — Z79899 Other long term (current) drug therapy: Secondary | ICD-10-CM | POA: Diagnosis not present

## 2020-06-23 DIAGNOSIS — O26891 Other specified pregnancy related conditions, first trimester: Secondary | ICD-10-CM | POA: Diagnosis not present

## 2020-06-23 DIAGNOSIS — O4691 Antepartum hemorrhage, unspecified, first trimester: Secondary | ICD-10-CM

## 2020-06-23 DIAGNOSIS — Z3A01 Less than 8 weeks gestation of pregnancy: Secondary | ICD-10-CM | POA: Insufficient documentation

## 2020-06-23 LAB — URINALYSIS, ROUTINE W REFLEX MICROSCOPIC
Bilirubin Urine: NEGATIVE
Glucose, UA: NEGATIVE mg/dL
Hgb urine dipstick: NEGATIVE
Ketones, ur: 20 mg/dL — AB
Leukocytes,Ua: NEGATIVE
Nitrite: NEGATIVE
Protein, ur: NEGATIVE mg/dL
Specific Gravity, Urine: 1.028 (ref 1.005–1.030)
pH: 5 (ref 5.0–8.0)

## 2020-06-23 LAB — CBC
HCT: 35 % — ABNORMAL LOW (ref 36.0–46.0)
Hemoglobin: 11.8 g/dL — ABNORMAL LOW (ref 12.0–15.0)
MCH: 27.6 pg (ref 26.0–34.0)
MCHC: 33.7 g/dL (ref 30.0–36.0)
MCV: 81.8 fL (ref 80.0–100.0)
Platelets: 245 10*3/uL (ref 150–400)
RBC: 4.28 MIL/uL (ref 3.87–5.11)
RDW: 13.8 % (ref 11.5–15.5)
WBC: 7.3 10*3/uL (ref 4.0–10.5)
nRBC: 0 % (ref 0.0–0.2)

## 2020-06-23 LAB — HCG, QUANTITATIVE, PREGNANCY: hCG, Beta Chain, Quant, S: 73 m[IU]/mL — ABNORMAL HIGH (ref <5)

## 2020-06-23 LAB — WET PREP, GENITAL
Sperm: NONE SEEN
Trich, Wet Prep: NONE SEEN
Yeast Wet Prep HPF POC: NONE SEEN

## 2020-06-23 LAB — COMPREHENSIVE METABOLIC PANEL
ALT: 11 U/L (ref 0–44)
AST: 15 U/L (ref 15–41)
Albumin: 4.2 g/dL (ref 3.5–5.0)
Alkaline Phosphatase: 44 U/L (ref 38–126)
Anion gap: 10 (ref 5–15)
BUN: 7 mg/dL (ref 6–20)
CO2: 23 mmol/L (ref 22–32)
Calcium: 9.6 mg/dL (ref 8.9–10.3)
Chloride: 105 mmol/L (ref 98–111)
Creatinine, Ser: 0.73 mg/dL (ref 0.44–1.00)
GFR, Estimated: >60 mL/min (ref >60)
Glucose, Bld: 93 mg/dL (ref 70–99)
Potassium: 3.8 mmol/L (ref 3.5–5.1)
Sodium: 138 mmol/L (ref 135–145)
Total Bilirubin: 0.7 mg/dL (ref 0.3–1.2)
Total Protein: 7.7 g/dL (ref 6.5–8.1)

## 2020-06-23 LAB — POCT PREGNANCY, URINE: Preg Test, Ur: POSITIVE — AB

## 2020-06-23 MED ORDER — METRONIDAZOLE 0.75 % VA GEL: 1.0000 | Freq: Two times a day (BID) | VAGINAL | 0 refills | Status: DC

## 2020-06-23 NOTE — MAU Note (Signed)
Pt reports she has had lower abd cramping 2-3 days had positive HPT 1 week ago.

## 2020-06-23 NOTE — MAU Provider Note (Signed)
[facility] Attending MAU Note  Chief Complaint: Abdominal Pain  First Provider Initiated Contact with Patient 06/23/20 1159     SUBJECTIVE Lori Baldwin is a 25 y.o. G2P1001 at [redacted]w[redacted]d by unsure LMP of 05/05/20 who presents with three day history of lower abdominal cramping.  Also endorses irregular bleeding unlike a period in the beginning of the month, had a regular period on 05/05/20.  Had three positive home Clear Blue urine pregnancy tests within the last week. Denies any abnormal vaginal discharge, chest pain, SOB, fevers, chills, sweats, dysuria, nausea, vomiting, other GI or GU symptoms or other general symptoms.  Past Medical History:  Diagnosis Date  . Anemia   . BV (bacterial vaginosis)   . Gonorrhea   . Headache   . Pharyngitis   . Yeast vaginitis    OB History  Gravida Para Term Preterm AB Living  3 1 1     1   SAB IAB Ectopic Multiple Live Births          1    # Outcome Date GA Lbr Len/2nd Weight Sex Delivery Anes PTL Lv  3 Current           2 Gravida           1 Term     M CS-LTranv  N LIV     Birth Comments: System Generated. Please review and update pregnancy details.   Past Surgical History:  Procedure Laterality Date  . CESAREAN SECTION     Social History   Socioeconomic History  . Marital status: Single    Spouse name: Not on file  . Number of children: Not on file  . Years of education: Not on file  . Highest education level: Not on file  Occupational History  . Not on file  Tobacco Use  . Smoking status: Never Smoker  . Smokeless tobacco: Never Used  Vaping Use  . Vaping Use: Never used  Substance and Sexual Activity  . Alcohol use: No  . Drug use: Not Currently    Types: Marijuana    Comment: last was early Dec  . Sexual activity: Yes    Birth control/protection: None  Other Topics Concern  . Not on file  Social History Narrative  . Not on file   Social Determinants of Health   Financial Resource Strain: Not on file  Food  Insecurity: Not on file  Transportation Needs: Not on file  Physical Activity: Not on file  Stress: Not on file  Social Connections: Not on file  Intimate Partner Violence: Not on file   No current facility-administered medications on file prior to encounter.   Current Outpatient Medications on File Prior to Encounter  Medication Sig Dispense Refill  . dicyclomine (BENTYL) 20 MG tablet Take 1 tablet (20 mg total) by mouth 2 (two) times daily. 20 tablet 0  . ondansetron (ZOFRAN ODT) 4 MG disintegrating tablet Take 1 tablet (4 mg total) by mouth every 8 (eight) hours as needed for nausea or vomiting. 20 tablet 0   Allergies  Allergen Reactions  . Aspirin Itching    Pt states she does fine when she takes ibuprofen      ROS: Pertinent items in HPI  OBJECTIVE BP 120/75   Pulse 88   Temp 98.5 F (36.9 C)   Resp 18   Ht 5' (1.524 m)   Wt 45.8 kg   LMP 05/05/2020   BMI 19.73 kg/m  CONSTITUTIONAL: Well-developed, well-nourished female in no  acute distress.  HENT:  Normocephalic, atraumatic, External right and left ear normal. Oropharynx is clear and moist EYES: Conjunctivae and EOM are normal. Pupils are equal, round, and reactive to light. No scleral icterus.  NECK: Normal range of motion, supple, no masses.  Normal thyroid.  SKIN: Skin is warm and dry. No rash noted. Not diaphoretic. No erythema. No pallor. NEUROLGIC: Alert and oriented to person, place, and time. Normal reflexes, muscle tone coordination. No cranial nerve deficit noted. PSYCHIATRIC: Normal mood and affect. Normal behavior. Normal judgment and thought content. CARDIOVASCULAR: Normal heart rate noted RESPIRATORY: Effort and breath sounds normal, no problems with respiration noted. ABDOMEN: Soft, normal bowel sounds, no distention noted.  No tenderness, rebound or guarding.   LAB RESULTS Results for orders placed or performed during the hospital encounter of 06/23/20 (from the past 48 hour(s))  CBC     Status:  Abnormal   Collection Time: 06/23/20 12:20 PM  Result Value Ref Range   WBC 7.3 4.0 - 10.5 K/uL   RBC 4.28 3.87 - 5.11 MIL/uL   Hemoglobin 11.8 (L) 12.0 - 15.0 g/dL   HCT 30.0 (L) 76.2 - 26.3 %   MCV 81.8 80.0 - 100.0 fL   MCH 27.6 26.0 - 34.0 pg   MCHC 33.7 30.0 - 36.0 g/dL   RDW 33.5 45.6 - 25.6 %   Platelets 245 150 - 400 K/uL   nRBC 0.0 0.0 - 0.2 %    Comment: Performed at [facility], [address] [place], [place] [address]  ABO/Rh     Status: None   Collection Time: 06/23/20 12:20 PM  Result Value Ref Range   ABO/RH(D)      A POS Performed at [facility], [address], [place], [place] [address]   Comprehensive metabolic panel     Status: None   Collection Time: 06/23/20 12:20 PM  Result Value Ref Range   Sodium 138 135 - 145 mmol/L   Potassium 3.8 3.5 - 5.1 mmol/L   Chloride 105 98 - 111 mmol/L   CO2 23 22 - 32 mmol/L   Glucose, Bld 93 70 - 99 mg/dL    Comment: Glucose reference range applies only to samples taken after fasting for at least 8 hours.   BUN 7 6 - 20 mg/dL   Creatinine, Ser 6.81 0.44 - 1.00 mg/dL   Calcium 9.6 8.9 - 15.7 mg/dL   Total Protein 7.7 6.5 - 8.1 g/dL   Albumin 4.2 3.5 - 5.0 g/dL   AST 15 15 - 41 U/L   ALT 11 0 - 44 U/L   Alkaline Phosphatase 44 38 - 126 U/L   Total Bilirubin 0.7 0.3 - 1.2 mg/dL   GFR, Estimated >26 >20 mL/min    Comment: (NOTE) Calculated using the CKD-EPI Creatinine Equation (2021)    Anion gap 10 5 - 15    Comment: Performed at [facility], [address], [place], [place] [address]  hCG, quantitative, pregnancy     Status: Abnormal   Collection Time: 06/23/20 12:20 PM  Result Value Ref Range   hCG, Beta Chain, Quant, S 73 (H) <5 mIU/mL    Comment:          GEST. AGE      CONC.  (mIU/mL)   <=1 WEEK        5 - 50     2 WEEKS       50 - 500  3 WEEKS       100 - 10,000     4 WEEKS     1,000 - 30,000     5 WEEKS     3,500 - 115,000   6-8 WEEKS     12,000 - 270,000    12 WEEKS      15,000 - 220,000        FEMALE AND NON-PREGNANT FEMALE:     LESS THAN 5 mIU/mL Performed at [facility], [address] [place], [place] [address]   Urinalysis, Routine w reflex microscopic Urine, Clean Catch     Status: Abnormal   Collection Time: 06/23/20 12:23 PM  Result Value Ref Range   Color, Urine YELLOW YELLOW   APPearance HAZY (A) CLEAR   Specific Gravity, Urine 1.028 1.005 - 1.030   pH 5.0 5.0 - 8.0   Glucose, UA NEGATIVE NEGATIVE mg/dL   Hgb urine dipstick NEGATIVE NEGATIVE   Bilirubin Urine NEGATIVE NEGATIVE   Ketones, ur 20 (A) NEGATIVE mg/dL   Protein, ur NEGATIVE NEGATIVE mg/dL   Nitrite NEGATIVE NEGATIVE   Leukocytes,Ua NEGATIVE NEGATIVE    Comment: Performed at [facility], [address], [place], [place] [address]  Pregnancy, urine POC     Status: Abnormal   Collection Time: 06/23/20 12:25 PM  Result Value Ref Range   Preg Test, Ur POSITIVE (A) NEGATIVE    Comment:        THE SENSITIVITY OF THIS METHODOLOGY IS >24 mIU/mL   Wet prep, genital     Status: Abnormal   Collection Time: 06/23/20  1:19 PM   Specimen: Genital  Result Value Ref Range   Yeast Wet Prep HPF POC NONE SEEN NONE SEEN   Trich, Wet Prep NONE SEEN NONE SEEN   Clue Cells Wet Prep HPF POC PRESENT (A) NONE SEEN   WBC, Wet Prep HPF POC MANY (A) NONE SEEN   Sperm NONE SEEN     Comment: Performed at [facility], [address], [place], [place] [address]    IMAGING US OB LESS THAN 14 WEEKS WITH OB TRANSVAGINAL  Result Date: 06/23/2020 CLINICAL DATA:  Abdominal pain EXAM: OBSTETRIC <14 WK [place] AND TRANSVAGINAL OB US TECHNIQUE: Both transabdominal and transvaginal ultrasound examinations were performed for complete evaluation of the gestation as well as the maternal uterus, adnexal regions, and pelvic cul-de-sac. Transvaginal technique was performed to assess early pregnancy. COMPARISON:  None. FINDINGS: Intrauterine gestational sac: None Yolk sac:  Not Visualized. Subchorionic  hemorrhage:  None visualized. Maternal uterus/adnexae: The ovaries are normal appearance. There is a amount fluid seen within the endometrial canal. No free fluid is noted. IMPRESSION: Given the history of a positive pregnancy test, differential considerations include intrauterine gestation too early to visualize, completed abortion, or non-visualized ectopic pregnancy. Close clinical correlation is recommended with serial beta-hCG and followup ultrasound as warranted. Electronically Signed   By: Jonna Clark M.D.   On: 06/23/2020 13:57    MAU COURSE Labs ordered Ultrasound ordered hCG level 73 ultrasound unremarkable, blood type a positive, continues to have pregnancy of unknown location. Wet prep significant only for clue cells.  ASSESSMENT 1. Vaginal bleeding affecting early pregnancy   2. Abdominal pain during pregnancy in first trimester   3. Pregnancy of unknown anatomic location     PLAN Return to MAU in 2 days for repeat beta hCG. Strict ectopic precautions reviewed, patient has previously had an ectopic pregnancy and is aware of what  to look out for. DC with prescription for MetroGel. Discharge home  Allergies as of 06/23/2020      Reactions   Aspirin Itching   Pt states she does fine when she takes ibuprofen       Medication List    STOP taking these medications   dicyclomine 20 MG tablet Commonly known as: BENTYL     TAKE these medications   ondansetron 4 MG disintegrating tablet Commonly known as: Zofran ODT Take 1 tablet (4 mg total) by mouth every 8 (eight) hours as needed for nausea or vomiting.      Venora Maples, MD 06/23/2020 2:34 PM

## 2020-06-23 NOTE — Discharge Instructions (Signed)
You were seen in the maternity assessment unit for pregnancy of unknown location.  You had a normal ultrasound that did not show any evidence of pregnancy.  Your hCG level, a hormone secreted in pregnancy, was very low at 73 which is why we did not see anything on ultrasound.  At present we do not know if you have a normal pregnancy or an ectopic pregnancy, so you need to return to the maternity assessment unit in 2 days so that we can repeat your hCG level.  If you have severe abdominal pain, heavy vaginal bleeding, or any other worrisome symptoms please return to return the maternity assessment unit sooner.

## 2020-06-24 LAB — GC/CHLAMYDIA PROBE AMP (~~LOC~~) NOT AT ARMC
Chlamydia: NEGATIVE
Comment: NEGATIVE
Comment: NORMAL
Neisseria Gonorrhea: NEGATIVE

## 2020-06-24 LAB — ABO/RH: ABO/RH(D): A POS

## 2020-06-25 ENCOUNTER — Inpatient Hospital Stay (HOSPITAL_COMMUNITY)
Admission: AD | Admit: 2020-06-25 | Discharge: 2020-06-25 | Disposition: A | Discharge status: Home / Self Care | Payer: Medicaid Other | Attending: Obstetrics & Gynecology | Admitting: Obstetrics & Gynecology

## 2020-06-25 DIAGNOSIS — O039 Complete or unspecified spontaneous abortion without complication: Secondary | ICD-10-CM | POA: Insufficient documentation

## 2020-06-25 DIAGNOSIS — Z3A01 Less than 8 weeks gestation of pregnancy: Secondary | ICD-10-CM | POA: Diagnosis not present

## 2020-06-25 DIAGNOSIS — O3680X0 Pregnancy with inconclusive fetal viability, not applicable or unspecified: Secondary | ICD-10-CM | POA: Diagnosis present

## 2020-06-25 LAB — HCG, QUANTITATIVE, PREGNANCY: hCG, Beta Chain, Quant, S: 30 m[IU]/mL — ABNORMAL HIGH (ref <5)

## 2020-06-25 NOTE — Discharge Instructions (Signed)
Miscarriage A miscarriage is the loss of pregnancy before the 20th week. Most miscarriages happen during the first 3 months of pregnancy. Sometimes, a miscarriage can happen before a woman knows that she is pregnant. Having a miscarriage can be an emotional experience. If you have had a miscarriage, talk with your health care provider about any questions you may have about the loss of your baby, the grieving process, and your plans for future pregnancy. What are the causes? Many times, the cause of a miscarriage is not known. What increases the risk? The following factors may make a pregnant woman more likely to have a miscarriage: Certain medical conditions  Conditions that affect the hormone balance in the body, such as thyroid disease or polycystic ovary syndrome.  Diabetes.  Autoimmune disorders.  Infections.  Bleeding disorders.  Obesity. Lifestyle factors  Using products with tobacco or nicotine in them or being exposed to tobacco smoke.  Having alcohol.  Having large amounts of caffeine.  Recreational drug use. Problems with reproductive organs or structures  Cervical insufficiency. This is when the lowest part of the uterus (cervix) opens and thins before pregnancy is at term.  Having a condition called Asherman syndrome. This syndrome causes scarring in the uterus or causes the uterus to be abnormal in structure.  Fibrous growths, called fibroids, in the uterus.  Congenital abnormalities. These problems are present at birth.  Infection of the cervix or uterus. Personal or medical history  Injury (trauma).  Having had a miscarriage before.  Being younger than age 18 or older than age 35.  Exposure to harmful substances in the environment. This may include radiation or heavy metals, such as lead.  Use of certain medicines. What are the signs or symptoms? Symptoms of this condition include:  Vaginal bleeding or spotting, with or without cramps or  pain.  Pain or cramping in the abdomen or lower back.  Fluid or tissue coming out of the vagina. How is this diagnosed? This condition may be diagnosed based on:  A physical exam.  Ultrasound.  Lab tests, such as blood tests, urine tests, or swabs for infection. How is this treated? Treatment for a miscarriage is sometimes not needed if all the pregnancy tissue that was in the uterus comes out on its own, and there are no other problems such as infection or heavy bleeding. In other cases, this condition may be treated with:  Dilation and curettage (D&C). In this procedure, the cervix is stretched open and any remaining pregnancy tissue is removed from the lining of the uterus (endometrium).  Medicines. These may include: ? Antibiotic medicine, to treat infection. ? Medicine to help any remaining pregnancy tissue come out of the body. ? Medicine to reduce (contract) the size of the uterus. These medicines may be given if there is a lot of bleeding. If you have Rh-negative blood, you may be given an injection of a medicine called Rho(D) immune globulin. This medicine helps prevent problems with future pregnancies. Follow these instructions at home: Medicines  Take over-the-counter and prescription medicines only as told by your health care provider.  If you were prescribed antibiotic medicine, take it as told by your health care provider. Do not stop taking the antibiotic even if you start to feel better. Activity  Rest as told by your health care provider. Ask your health care provider what activities are safe for you.  Have someone help with home and family responsibilities during this time. General instructions  Monitor how much tissue   or blood clot material comes out of the vagina.  Do not have sex, douche, or put anything, such as tampons, in your vagina until your health care provider says it is okay.  To help you and your partner with the grieving process, talk with your  health care provider or get counseling.  When you are ready, meet with your health care provider to discuss any important steps you should take for your health. Also, discuss steps you should take to have a healthy pregnancy in the future.  Keep all follow-up visits. This is important.   Where to find more information  The American College of Obstetricians and Gynecologists: acog.org  [place] Department of Health and Human Services Office of Women's Health: hrsa.gov/office-womens-health Contact a health care provider if:  You have a fever or chills.  There is bad-smelling fluid coming from the vagina.  You have more bleeding instead of less.  Tissue or blood clots come out of your vagina. Get help right away if:  You have severe cramps or pain in your back or abdomen.  Heavy bleeding soaks through 2 large sanitary pads an hour for more than 2 hours.  You become light-headed or weak.  You faint.  You feel sad, and your sadness takes over your thoughts.  You think about hurting yourself. If you ever feel like you may hurt yourself or others, or have thoughts about taking your own life, get help right away. Go to your nearest emergency department or:  Call your local emergency services (911 in the [place]  Call a suicide crisis helpline, such as the National Suicide Prevention Lifeline at 1-800-273-8255. This is open 24 hours a day in the [place]  Text the Crisis Text Line at 741741 (in the [place] Summary  Most miscarriages happen in the first 3 months of pregnancy. Sometimes miscarriage happens before a woman knows that she is pregnant.  Follow instructions from your health care provider about medicines and activity.  To help you and your partner with grieving, talk with your health care provider or get counseling.  Keep all follow-up visits. This information is not intended to replace advice given to you by your health care provider. Make sure you discuss any questions you  have with your health care provider. Document Revised: 11/13/2019 Document Reviewed: 11/13/2019 Elsevier Patient Education  2021 Elsevier Inc.  

## 2020-06-25 NOTE — MAU Provider Note (Signed)
History   Chief Complaint:  Labs Only   Lori Baldwin is  25 y.o. G3P1001 Patient's last menstrual period was 05/05/2020.Marland Kitchen Patient is here for follow up of quantitative HCG and ongoing surveillance of pregnancy status. She is [redacted]w[redacted]d weeks gestation  by LMP.    Since her last visit, the patient is without new complaint. The patient reports bleeding as  none now.  She denies any pain.  General ROS:  negative  Her previous Quantitative HCG values are:  Results for Lori Baldwin (MRN [id number]) as of 06/25/2020 16:06  Ref. Range 06/23/2020 12:20  HCG, Beta Chain, Quant, S Latest Ref Range: <5 mIU/mL 73 (H)    Physical Exam   Blood pressure 117/66, pulse 88, temperature 99 F (37.2 C), resp. rate 15, last menstrual period 05/05/2020, SpO2 100 %, unknown if currently breastfeeding.  Focused Gynecological Exam: examination not indicated  Labs: Results for orders placed or performed during the hospital encounter of 06/25/20 (from the past 24 hour(s))  hCG, quantitative, pregnancy   Collection Time: 06/25/20 12:56 PM  Result Value Ref Range   hCG, Beta Chain, Quant, S 30 (H) <5 mIU/mL   Assessment:   1. Miscarriage   2. [redacted] weeks gestation of pregnancy       Plan: -Discharge home in stable condition -SAB precautions discussed -Patient advised to follow-up with [facility] in 1 week for repeat HCG and 2 weeks with a provider, message sent  -Patient may return to MAU as needed or if her condition were to change or worsen  Rolm Bookbinder, CNM 06/25/2020, 4:06 PM

## 2020-06-25 NOTE — MAU Note (Signed)
.   Lori Baldwin is a 25 y.o. at [redacted]w[redacted]d here in MAU reporting: here for repeat quant. Denies VB or pain.    Vitals:   06/25/20 1253  BP: 117/66  Pulse: 88  Resp: 15  Temp: 99 F (37.2 C)  SpO2: 100%

## 2020-06-29 ENCOUNTER — Other Ambulatory Visit: Payer: Self-pay | Admitting: *Deleted

## 2020-06-29 DIAGNOSIS — O039 Complete or unspecified spontaneous abortion without complication: Secondary | ICD-10-CM

## 2020-07-04 ENCOUNTER — Other Ambulatory Visit: Payer: Medicaid Other

## 2020-07-08 ENCOUNTER — Other Ambulatory Visit: Payer: Self-pay

## 2020-07-08 ENCOUNTER — Other Ambulatory Visit: Payer: Medicaid Other

## 2020-07-08 DIAGNOSIS — O039 Complete or unspecified spontaneous abortion without complication: Secondary | ICD-10-CM

## 2020-07-12 ENCOUNTER — Ambulatory Visit: Payer: Medicaid Other | Admitting: Certified Nurse Midwife

## 2020-07-13 LAB — BETA HCG QUANT (REF LAB): hCG Quant: <1 m[IU]/mL

## 2020-12-25 ENCOUNTER — Other Ambulatory Visit: Payer: Self-pay

## 2020-12-25 ENCOUNTER — Encounter (HOSPITAL_COMMUNITY): Payer: Self-pay | Admitting: Obstetrics and Gynecology

## 2020-12-25 ENCOUNTER — Inpatient Hospital Stay (HOSPITAL_COMMUNITY)
Admission: AD | Admit: 2020-12-25 | Discharge: 2020-12-25 | Disposition: A | Discharge status: Home / Self Care | Payer: Medicaid Other | Attending: Obstetrics and Gynecology | Admitting: Obstetrics and Gynecology

## 2020-12-25 DIAGNOSIS — Z8759 Personal history of other complications of pregnancy, childbirth and the puerperium: Secondary | ICD-10-CM | POA: Insufficient documentation

## 2020-12-25 DIAGNOSIS — Z3201 Encounter for pregnancy test, result positive: Secondary | ICD-10-CM | POA: Insufficient documentation

## 2020-12-25 LAB — HCG, QUANTITATIVE, PREGNANCY: hCG, Beta Chain, Quant, S: 620 m[IU]/mL — ABNORMAL HIGH (ref <5)

## 2020-12-25 NOTE — MAU Provider Note (Signed)
History    [id number]  Arrival date and time: 12/25/20 1019   Chief Complaint  Patient presents with   Possible Pregnancy   HPI Lori Baldwin is a 25 y.o. [id number] who presents to MAU for possible pregnancy. Patient reports that she has had multiple positive pregnancy tests at home. She thinks her last period was late June ~6/22 but she is not certain due to a loss in the family that was going on around the same time. She has a history of SAB and ectopic pregnancy and came here today to make sure this pregnancy was not going to be complicated by these issues. She is contemplating terminating her pregnancy due to these previous episodes. She denies any abdominal pain, vaginal bleeding, vaginal discharge, or dysuria.  Vaginal bleeding: No LOF: No Fetal Movement: No Contractions: No  --/--/A POS (01/27 1220)  OB History     Gravida  3   Para  1   Term  1   Preterm      AB  2   Living  1      SAB  1   IAB      Ectopic  1   Multiple      Live Births  1           Past Medical History:  Diagnosis Date   Anemia    BV (bacterial vaginosis)    Gonorrhea    Headache    Pharyngitis    Yeast vaginitis     Past Surgical History:  Procedure Laterality Date   CESAREAN SECTION      Family History  Problem Relation Age of Onset   Cancer Maternal Grandmother        breast   Sickle cell anemia Paternal Grandmother    Hypertension Mother    Diabetes Maternal Grandfather     Social History   Socioeconomic History   Marital status: Single    Spouse name: Not on file   Number of children: Not on file   Years of education: Not on file   Highest education level: Not on file  Occupational History   Not on file  Tobacco Use   Smoking status: Never   Smokeless tobacco: Never  Vaping Use   Vaping Use: Never used  Substance and Sexual Activity   Alcohol use: No   Drug use: Not Currently    Types: Marijuana    Comment: last was early Dec   Sexual  activity: Yes    Birth control/protection: None  Other Topics Concern   Not on file  Social History Narrative   Not on file   Social Determinants of Health   Financial Resource Strain: Not on file  Food Insecurity: Not on file  Transportation Needs: Not on file  Physical Activity: Not on file  Stress: Not on file  Social Connections: Not on file  Intimate Partner Violence: Not on file    Allergies  Allergen Reactions   Aspirin Itching    Pt states she does fine when she takes ibuprofen      No current facility-administered medications on file prior to encounter.   Current Outpatient Medications on File Prior to Encounter  Medication Sig Dispense Refill   metroNIDAZOLE (METROGEL VAGINAL) 0.75 % vaginal gel Place 1 Applicatorful vaginally 2 (two) times daily. 70 g 0   ondansetron (ZOFRAN ODT) 4 MG disintegrating tablet Take 1 tablet (4 mg total) by mouth every 8 (eight) hours as needed for nausea or  vomiting. 20 tablet 0     ROS Pertinent positives and negative per HPI, all others reviewed and negative  Physical Exam   BP 119/65 (BP Location: Right Arm)   Pulse 89   Temp 99.2 F (37.3 C) (Oral)   Resp 16   Ht 5' (1.524 m)   Wt 45.4 kg   LMP 11/16/2020 (Approximate)   SpO2 99% Comment: room air  Breastfeeding Unknown   BMI 19.55 kg/m   Physical Exam Constitutional:      General: She is not in acute distress.    Appearance: Normal appearance.  HENT:     Head: Normocephalic and atraumatic.  Eyes:     Conjunctiva/sclera: Conjunctivae normal.  Cardiovascular:     Rate and Rhythm: Normal rate and regular rhythm.     Heart sounds: No murmur heard. Pulmonary:     Effort: Pulmonary effort is normal.     Breath sounds: Normal breath sounds.  Abdominal:     General: Bowel sounds are normal.     Palpations: Abdomen is soft.     Tenderness: There is no abdominal tenderness.  Musculoskeletal:        General: Normal range of motion.     Right lower leg: No  edema.     Left lower leg: No edema.  Skin:    General: Skin is warm and dry.  Neurological:     Mental Status: She is alert and oriented to person, place, and time.  Psychiatric:        Mood and Affect: Mood normal.        Behavior: Behavior normal.   Labs No results found for this or any previous visit (from the past 24 hour(s)).  Imaging No results found.  MAU Course  Procedures Lab Orders  hCG, quantitative, pregnancy   No orders of the defined types were placed in this encounter.  Imaging Orders  No imaging studies ordered today   MDM Patient presents after positive home pregnancy test with estimated LMP 11/16/20. No pelvic pain, bleeding, discharge, or dysuria. Hx ectopic pregnancy and SAB.   In the absence of symptoms, low concern for ectopic pregnancy or miscarriage today. Provided patient reassurance and reviewed worrisome symptoms that would warrant return for further evaluation.   hCG ordered in MAU today with plan to trend in 2 days to ensure appropriate rise. Patient will plan to make new OB appointment if she decides to continue with current pregnancy.  Overall stable for discharge home with further outpatient follow up. Patient voiced understanding and agreeable to plan.   Assessment and Plan  Positive urine pregnancy test - Plan: Discharge patient  Follow up lab appointment on 8/2 at [facility] at 9 AM.   Worthy Rancher, MD Obstetrics Fellow 12/25/20 11:39 AM

## 2020-12-25 NOTE — MAU Note (Signed)
Lori Baldwin is a 25 y.o. here in MAU reporting: has had multiple + UPT at home. Is concerned for SAB/ectopic due to history. Denies pain and bleeding.  LMP: 11/16/2020  Onset of complaint: today  Pain score: 0/10  Vitals:   12/25/20 1047  BP: 119/65  Pulse: 89  Resp: 16  Temp: 99.2 F (37.3 C)  SpO2: 99%     Lab orders placed from triage: none

## 2020-12-27 ENCOUNTER — Ambulatory Visit (INDEPENDENT_AMBULATORY_CARE_PROVIDER_SITE_OTHER): Payer: Medicaid Other

## 2020-12-27 ENCOUNTER — Other Ambulatory Visit: Payer: Self-pay

## 2020-12-27 DIAGNOSIS — O209 Hemorrhage in early pregnancy, unspecified: Secondary | ICD-10-CM

## 2020-12-27 DIAGNOSIS — R109 Unspecified abdominal pain: Secondary | ICD-10-CM

## 2020-12-27 DIAGNOSIS — O26891 Other specified pregnancy related conditions, first trimester: Secondary | ICD-10-CM

## 2020-12-27 LAB — BETA HCG QUANT (REF LAB): hCG Quant: 1367 m[IU]/mL

## 2020-12-27 NOTE — Progress Notes (Signed)
Pt here today for STAT Beta from MAU follow up on 12/25/20.  Pt denies any vaginal bleeding, pain or cramps. Pt advised after results come back from Beta, will be contacted via phone with plan of care per Dr Crissie Reese. Pt verbalized understanding.  Judeth Cornfield, RN

## 2020-12-27 NOTE — Patient Instructions (Signed)

## 2020-12-27 NOTE — Progress Notes (Deleted)
Pt here today for STAT Beta from MAU follow up on 12/25/2020.  Pt denies any vaginal bleeding, pain or cramps. Pt advised after results come back from Beta, will be contacted via phone with plan of care per Dr Crissie Reese.  Pt verbalized understanding.    Judeth Cornfield, RN

## 2020-12-27 NOTE — Progress Notes (Signed)
Reviewed results with Dr Crissie Reese. Advised normal rise in beta results. Pt to have [place] in 2 weeks for viability. Pt to be called.   Call placed to pt. Spoke with pt. Pt given results and recommendations per Dr Crissie Reese. Pt verbalized understanding and agreeable to plan of care. Ectopic and Heavy bleeding precautions reviewed. Pt has hx of SAB and ectopic.   [place] scheduled 8/16@ 10am. Pt agreeable to date and time of appt.   Judeth Cornfield, RN

## 2020-12-28 NOTE — Progress Notes (Signed)
Chart reviewed for nurse visit. Agree with plan of care.   Hcg rise [id number]  Venora Maples, MD 12/28/20 7:35 AM

## 2021-01-01 ENCOUNTER — Other Ambulatory Visit: Payer: Self-pay

## 2021-01-01 ENCOUNTER — Inpatient Hospital Stay (HOSPITAL_COMMUNITY): Payer: Medicaid Other

## 2021-01-01 ENCOUNTER — Encounter (HOSPITAL_COMMUNITY): Payer: Self-pay

## 2021-01-01 ENCOUNTER — Inpatient Hospital Stay (HOSPITAL_COMMUNITY)
Admission: AD | Admit: 2021-01-01 | Discharge: 2021-01-01 | Disposition: A | Discharge status: Home / Self Care | Payer: Medicaid Other | Attending: Obstetrics and Gynecology | Admitting: Obstetrics and Gynecology

## 2021-01-01 DIAGNOSIS — O26891 Other specified pregnancy related conditions, first trimester: Secondary | ICD-10-CM | POA: Insufficient documentation

## 2021-01-01 DIAGNOSIS — Z5321 Procedure and treatment not carried out due to patient leaving prior to being seen by health care provider: Secondary | ICD-10-CM | POA: Insufficient documentation

## 2021-01-01 DIAGNOSIS — Z3A01 Less than 8 weeks gestation of pregnancy: Secondary | ICD-10-CM | POA: Insufficient documentation

## 2021-01-01 DIAGNOSIS — O26899 Other specified pregnancy related conditions, unspecified trimester: Secondary | ICD-10-CM

## 2021-01-01 DIAGNOSIS — R109 Unspecified abdominal pain: Secondary | ICD-10-CM | POA: Insufficient documentation

## 2021-01-01 DIAGNOSIS — Z3491 Encounter for supervision of normal pregnancy, unspecified, first trimester: Secondary | ICD-10-CM

## 2021-01-01 DIAGNOSIS — Z8759 Personal history of other complications of pregnancy, childbirth and the puerperium: Secondary | ICD-10-CM | POA: Insufficient documentation

## 2021-01-01 DIAGNOSIS — O0911 Supervision of pregnancy with history of ectopic or molar pregnancy, first trimester: Secondary | ICD-10-CM | POA: Insufficient documentation

## 2021-01-01 LAB — WET PREP, GENITAL
Clue Cells Wet Prep HPF POC: NONE SEEN
Sperm: NONE SEEN
Trich, Wet Prep: NONE SEEN
Yeast Wet Prep HPF POC: NONE SEEN

## 2021-01-01 LAB — CBC
HCT: 33.7 % — ABNORMAL LOW (ref 36.0–46.0)
Hemoglobin: 11.3 g/dL — ABNORMAL LOW (ref 12.0–15.0)
MCH: 27.5 pg (ref 26.0–34.0)
MCHC: 33.5 g/dL (ref 30.0–36.0)
MCV: 82 fL (ref 80.0–100.0)
Platelets: 276 10*3/uL (ref 150–400)
RBC: 4.11 MIL/uL (ref 3.87–5.11)
RDW: 13.6 % (ref 11.5–15.5)
WBC: 8.2 10*3/uL (ref 4.0–10.5)
nRBC: 0 % (ref 0.0–0.2)

## 2021-01-01 LAB — URINALYSIS, ROUTINE W REFLEX MICROSCOPIC
Bilirubin Urine: NEGATIVE
Glucose, UA: NEGATIVE mg/dL
Hgb urine dipstick: NEGATIVE
Ketones, ur: NEGATIVE mg/dL
Leukocytes,Ua: NEGATIVE
Nitrite: NEGATIVE
Protein, ur: NEGATIVE mg/dL
Specific Gravity, Urine: 1.023 (ref 1.005–1.030)
pH: 6 (ref 5.0–8.0)

## 2021-01-01 LAB — HCG, QUANTITATIVE, PREGNANCY: hCG, Beta Chain, Quant, S: 14085 m[IU]/mL — ABNORMAL HIGH (ref <5)

## 2021-01-01 NOTE — MAU Note (Signed)
Pt reports to mau with c/o lower abd cramping for the past 2 days.  Denies vag bleeding. Reports hx of ectopic preg.

## 2021-01-01 NOTE — Discharge Instructions (Signed)

## 2021-01-01 NOTE — MAU Note (Signed)
Pt left before dc bp taken or dc papers given

## 2021-01-02 LAB — GC/CHLAMYDIA PROBE AMP (~~LOC~~) NOT AT ARMC
Chlamydia: NEGATIVE
Comment: NEGATIVE
Comment: NORMAL
Neisseria Gonorrhea: NEGATIVE

## 2021-01-10 ENCOUNTER — Other Ambulatory Visit: Payer: Self-pay

## 2021-01-10 ENCOUNTER — Ambulatory Visit
Admission: RE | Admit: 2021-01-10 | Discharge: 2021-01-10 | Disposition: A | Discharge status: Home / Self Care | Payer: Medicaid Other | Source: Ambulatory Visit | Attending: Family Medicine | Admitting: Family Medicine

## 2021-01-10 ENCOUNTER — Telehealth: Payer: Self-pay

## 2021-01-10 ENCOUNTER — Other Ambulatory Visit: Payer: Self-pay | Admitting: Family Medicine

## 2021-01-10 DIAGNOSIS — O26891 Other specified pregnancy related conditions, first trimester: Secondary | ICD-10-CM | POA: Diagnosis present

## 2021-01-10 DIAGNOSIS — R109 Unspecified abdominal pain: Secondary | ICD-10-CM

## 2021-01-10 NOTE — Telephone Encounter (Signed)
Pt called asking about OB US results from today. Spoke with pt. Pt asking about OB US results. Reviewed results with Dr Crissie Reese. Pt given results and recommendations to make new OB intake visit in 2-3 weeks that will be virutal. Pt verbalized understanding. Pt advised front office will make appt and to watch for visit on Mychart. Pt agreeable to plan of care.   Judeth Cornfield, RN

## 2021-02-01 ENCOUNTER — Telehealth (INDEPENDENT_AMBULATORY_CARE_PROVIDER_SITE_OTHER): Payer: Medicaid Other

## 2021-02-01 DIAGNOSIS — Z136 Encounter for screening for cardiovascular disorders: Secondary | ICD-10-CM

## 2021-02-01 DIAGNOSIS — O0990 Supervision of high risk pregnancy, unspecified, unspecified trimester: Secondary | ICD-10-CM | POA: Insufficient documentation

## 2021-02-01 DIAGNOSIS — Z3480 Encounter for supervision of other normal pregnancy, unspecified trimester: Secondary | ICD-10-CM

## 2021-02-01 DIAGNOSIS — Z3A00 Weeks of gestation of pregnancy not specified: Secondary | ICD-10-CM

## 2021-02-01 MED ORDER — BLOOD PRESSURE MONITORING DEVI: 1.0000 | 0 refills | Status: DC

## 2021-02-01 NOTE — Progress Notes (Signed)
New OB Intake  I connected with  Lori Baldwin on 02/01/21 at 11:15 AM EDT by MyChart Video Visit and verified that I am speaking with the correct person using two identifiers. Nurse is located at [facility] and pt is located at HOME.  I discussed the limitations, risks, security and privacy concerns of performing an evaluation and management service by telephone and the availability of in person appointments. I also discussed with the patient that there may be a patient responsible charge related to this service. The patient expressed understanding and agreed to proceed.  I explained I am completing New OB Intake today. We discussed her EDD of 09/04/21 that is based on U/S on 01/10/21. Pt is G4/P1. I reviewed her allergies, medications, Medical/Surgical/OB history, and appropriate screenings. I informed her of [facility] services. Based on history, this is a/an  pregnancy uncomplicated .   There are no problems to display for this patient.   Concerns addressed today  Delivery Plans:  Plans to deliver at [facility].   MyChart/Babyscripts MyChart access verified. I explained pt will have some visits in office and some virtually. Babyscripts instructions given and order placed. Patient verifies receipt of registration text/e-mail. Account successfully created and app downloaded.  Blood Pressure Cuff  Blood pressure cuff ordered for patient to pick-up from [employer]. Explained after first prenatal appt pt will check weekly and document in Babyscripts.  Weight scale: Patient    have weight scale. Weight scale ordered for patient to pick up form [facility].   Anatomy US Explained first scheduled [place] will be around 19 weeks. Anatomy US scheduled for 04/10/21 at 9:45a. Pt notified to arrive at 9:30a.  Labs Discussed Avelina Laine genetic screening with patient. Would like both Panorama and Horizon drawn at new OB visit. Routine prenatal labs needed.  Covid Vaccine Patient has not covid vaccine.    Mother/ Baby Dyad Candidate?    If yes, offer as possibility  Informed patient of [facility] website  and placed link in her AVS.   Social Determinants of Health Food Insecurity: Patient denies food insecurity. WIC Referral: Patient is interested in referral to [facility].  Transportation: Patient denies transportation needs. Childcare: Discussed no children allowed at ultrasound appointments. Offered childcare services; patient declines childcare services at this time.  Send link to Pregnancy Navigators   Placed OB Box on problem list and updated  First visit review I reviewed new OB appt with pt. I explained she will have a pelvic exam, ob bloodwork with genetic screening, and PAP smear. Explained pt will be seen by Dr. Vergie Living at first visit; encounter routed to appropriate provider. Explained that patient will be seen by pregnancy navigator following visit with provider. [facility] information placed in AVS.   Henrietta Dine, CMA 02/01/2021  11:21 AM

## 2021-02-16 ENCOUNTER — Encounter: Payer: Medicaid Other | Admitting: Obstetrics and Gynecology

## 2021-02-22 ENCOUNTER — Ambulatory Visit (INDEPENDENT_AMBULATORY_CARE_PROVIDER_SITE_OTHER): Payer: Medicaid Other | Admitting: Family Medicine

## 2021-02-22 ENCOUNTER — Other Ambulatory Visit: Payer: Self-pay

## 2021-02-22 ENCOUNTER — Encounter: Payer: Self-pay | Admitting: Family Medicine

## 2021-02-22 ENCOUNTER — Other Ambulatory Visit (HOSPITAL_COMMUNITY)
Admission: RE | Admit: 2021-02-22 | Discharge: 2021-02-22 | Disposition: A | Discharge status: Home / Self Care | Payer: Medicaid Other | Source: Ambulatory Visit | Attending: Family Medicine | Admitting: Family Medicine

## 2021-02-22 VITALS — BP 112/59 | HR 100 | Wt 103.9 lb

## 2021-02-22 DIAGNOSIS — O34219 Maternal care for unspecified type scar from previous cesarean delivery: Secondary | ICD-10-CM

## 2021-02-22 DIAGNOSIS — N898 Other specified noninflammatory disorders of vagina: Secondary | ICD-10-CM | POA: Diagnosis not present

## 2021-02-22 DIAGNOSIS — Z3480 Encounter for supervision of other normal pregnancy, unspecified trimester: Secondary | ICD-10-CM

## 2021-02-22 DIAGNOSIS — O09899 Supervision of other high risk pregnancies, unspecified trimester: Secondary | ICD-10-CM

## 2021-02-22 DIAGNOSIS — Z2839 Other underimmunization status: Secondary | ICD-10-CM | POA: Diagnosis not present

## 2021-02-22 MED ORDER — GOJJI WEIGHT SCALE MISC: 1.0000 | 0 refills | Status: DC | PRN

## 2021-02-22 NOTE — Patient Instructions (Signed)

## 2021-02-23 DIAGNOSIS — O09899 Supervision of other high risk pregnancies, unspecified trimester: Secondary | ICD-10-CM | POA: Insufficient documentation

## 2021-02-23 LAB — CBC/D/PLT+RPR+RH+ABO+RUBIGG...
Antibody Screen: NEGATIVE
Basophils Absolute: 0 10*3/uL (ref 0.0–0.2)
Basos: 0 %
EOS (ABSOLUTE): 0.2 10*3/uL (ref 0.0–0.4)
Eos: 2 %
HCV Ab: <0.1 s/co ratio (ref 0.0–0.9)
HIV Screen 4th Generation wRfx: NONREACTIVE
Hematocrit: 32.5 % — ABNORMAL LOW (ref 34.0–46.6)
Hemoglobin: 10.5 g/dL — ABNORMAL LOW (ref 11.1–15.9)
Hepatitis B Surface Ag: NEGATIVE
Immature Grans (Abs): 0.1 10*3/uL (ref 0.0–0.1)
Immature Granulocytes: 1 %
Lymphocytes Absolute: 2.6 10*3/uL (ref 0.7–3.1)
Lymphs: 24 %
MCH: 27.1 pg (ref 26.6–33.0)
MCHC: 32.3 g/dL (ref 31.5–35.7)
MCV: 84 fL (ref 79–97)
Monocytes Absolute: 0.8 10*3/uL (ref 0.1–0.9)
Monocytes: 8 %
Neutrophils Absolute: 7.2 10*3/uL — ABNORMAL HIGH (ref 1.4–7.0)
Neutrophils: 65 %
Platelets: 226 10*3/uL (ref 150–450)
RBC: 3.88 x10E6/uL (ref 3.77–5.28)
RDW: 13.9 % (ref 11.7–15.4)
RPR Ser Ql: NONREACTIVE
Rh Factor: POSITIVE
Rubella Antibodies, IGG: <0.9 index — ABNORMAL LOW (ref >0.99)
WBC: 10.9 10*3/uL — ABNORMAL HIGH (ref 3.4–10.8)

## 2021-02-23 LAB — CERVICOVAGINAL ANCILLARY ONLY
Bacterial Vaginitis (gardnerella): NEGATIVE
Candida Glabrata: NEGATIVE
Candida Vaginitis: POSITIVE — AB
Chlamydia: NEGATIVE
Comment: NEGATIVE
Comment: NEGATIVE
Comment: NEGATIVE
Comment: NEGATIVE
Comment: NEGATIVE
Comment: NORMAL
Neisseria Gonorrhea: NEGATIVE
Trichomonas: NEGATIVE

## 2021-02-23 LAB — HEMOGLOBIN A1C
Est. average glucose Bld gHb Est-mCnc: 103 mg/dL
Hgb A1c MFr Bld: 5.2 % (ref 4.8–5.6)

## 2021-02-23 LAB — HCV INTERPRETATION

## 2021-02-23 MED ORDER — TERCONAZOLE 0.8 % VA CREA: 1.0000 | TOPICAL_CREAM | Freq: Every day | VAGINAL | 0 refills | Status: DC

## 2021-02-24 ENCOUNTER — Encounter: Payer: Self-pay | Admitting: Family Medicine

## 2021-02-24 LAB — CYTOLOGY - PAP
Adequacy: ABSENT
Comment: NEGATIVE
Diagnosis: NEGATIVE
High risk HPV: NEGATIVE

## 2021-02-24 LAB — CULTURE, OB URINE

## 2021-02-24 LAB — URINE CULTURE, OB REFLEX

## 2021-02-24 NOTE — Progress Notes (Signed)
Subjective:   Lori Baldwin is a 25 y.o. [id number] at 12w4dby early ultrasound being seen today for her first obstetrical visit.  Her obstetrical history is significant for  previous c-section . Patient does intend to breast feed. Pregnancy history fully reviewed.  Patient reports no complaints.  HISTORY: OB History  Gravida Para Term Preterm AB Living  4 1 1  0 2 1  SAB IAB Ectopic Multiple Live Births  1 0 1 0 1    # Outcome Date GA Lbr Len/2nd Weight Sex Delivery Anes PTL Lv  4 Current           3 SAB 06/2020     SAB     2 Term 04/22/12 [id number] lb 14 oz (3.572 kg) M CS-LTranv  N LIV     Birth Comments: System Generated. Please review and update pregnancy details.  1 Ectopic      ECTOPIC       Obstetric Comments  C-section due to FHR changes    Past Medical History:  Diagnosis Date   Anemia    BV (bacterial vaginosis)    Gonorrhea    Headache    Pharyngitis    Yeast vaginitis    Past Surgical History:  Procedure Laterality Date   CESAREAN SECTION     Family History  Problem Relation Age of Onset   Hypertension Mother    Cancer Maternal Grandmother        breast   Diabetes Maternal Grandfather    Sickle cell anemia Paternal Grandmother    Social History   Tobacco Use   Smoking status: Never   Smokeless tobacco: Never  Vaping Use   Vaping Use: Never used  Substance Use Topics   Alcohol use: No   Drug use: Not Currently    Types: Marijuana    Comment: last was early Dec   Allergies  Allergen Reactions   Aspirin Itching    Pt states she does fine when she takes ibuprofen     Current Outpatient Medications on File Prior to Visit  Medication Sig Dispense Refill   Blood Pressure Monitoring DEVI 1 each by Does not apply route once a week. 1 each 0   Prenatal Vit-Fe Fumarate-FA (PREPLUS) 27-1 MG TABS Take by mouth.     No current facility-administered medications on file prior to visit.     Exam   Vitals:   02/22/21 1506  BP: (!) 112/59   Pulse: 100  Weight: 103 lb 14.4 oz (47.1 kg)   Fetal Heart Rate (bpm): 160  Uterus:   12 week size  Pelvic Exam: Perineum: no hemorrhoids, normal perineum   Vulva: normal external genitalia, no lesions   Vagina:  normal mucosa, normal discharge   Cervix: no lesions and normal, pap smear done.    Adnexa: normal adnexa and no mass, fullness, tenderness   Bony Pelvis: average  System: General: well-developed, well-nourished female in no acute distress   Skin: normal coloration and turgor, no rashes   Neurologic: oriented, normal, negative, normal mood   Extremities: normal strength, tone, and muscle mass, ROM of all joints is normal   HEENT PERRLA, extraocular movement intact and sclera clear, anicteric   Mouth/Teeth mucous membranes moist, pharynx normal without lesions and dental hygiene good   Neck supple and no masses   Cardiovascular: regular rate and rhythm   Respiratory:  no respiratory distress, normal breath sounds   Abdomen: soft, non-tender; bowel sounds normal; no  masses,  no organomegaly     Assessment:   Pregnancy: [id number] Patient Active Problem List   Diagnosis Date Noted   Rubella non-immune status, antepartum 02/23/2021   Previous cesarean delivery affecting pregnancy, antepartum 02/22/2021   Supervision of other normal pregnancy, antepartum 02/01/2021     Plan:  1. Supervision of other normal pregnancy, antepartum New OB labs - Cytology - PAP( [facility]) - Genetic Screening - Culture, OB Urine - CBC/D/Plt+RPR+Rh+ABO+RubIgG... - Cervicovaginal ancillary only( ) - Hemoglobin A1c  2. Previous cesarean delivery affecting pregnancy, antepartum Desires TOLAC--reviewed risks  3. Vaginal discharge - terconazole (TERAZOL 3) 0.8 % vaginal cream; Place 1 applicator vaginally at bedtime.  Dispense: 20 g; Refill: 0  4. Rubella non-immune status, antepartum MMR pp   Initial labs drawn. Continue prenatal vitamins. Genetic Screening discussed,  NIPS: ordered. Ultrasound discussed; fetal anatomic survey: ordered. Problem list reviewed and updated. The nature of [place] with multiple MDs and other Advanced Practice Providers was explained to patient; also emphasized that residents, students are part of our team. Routine obstetric precautions reviewed. Return in 4 weeks (on 03/22/2021).

## 2021-03-13 ENCOUNTER — Telehealth: Payer: Self-pay | Admitting: Lactation Services

## 2021-03-13 ENCOUNTER — Encounter: Payer: Self-pay | Admitting: Family Medicine

## 2021-03-13 DIAGNOSIS — D563 Thalassemia minor: Secondary | ICD-10-CM | POA: Insufficient documentation

## 2021-03-13 NOTE — Telephone Encounter (Signed)
Called patient with results of Horizon Carrier Screening resulting that patient is a silent carrier for Alpha Thal and Increased Carrier Risk for SMA.   Advised patient to call Avelina Laine and set up telephone Genetic Counseling session to discuss results. Patient reports she has the phone number on the Website.   Recommended that FOB be tested to see is he carries the same genes. Patient reports he is planning to come to her next appointment. Informed that we have the saliva kits in the office for testing.   Patient voiced understanding. She has no questions or concerns at this time.

## 2021-03-16 ENCOUNTER — Encounter: Payer: Self-pay | Admitting: Family Medicine

## 2021-03-16 DIAGNOSIS — Z148 Genetic carrier of other disease: Secondary | ICD-10-CM | POA: Insufficient documentation

## 2021-03-24 ENCOUNTER — Other Ambulatory Visit: Payer: Self-pay

## 2021-03-24 ENCOUNTER — Ambulatory Visit (INDEPENDENT_AMBULATORY_CARE_PROVIDER_SITE_OTHER): Payer: Medicaid Other | Admitting: Obstetrics and Gynecology

## 2021-03-24 VITALS — BP 102/63 | HR 113 | Wt 110.6 lb

## 2021-03-24 DIAGNOSIS — Z3A16 16 weeks gestation of pregnancy: Secondary | ICD-10-CM | POA: Insufficient documentation

## 2021-03-24 DIAGNOSIS — D563 Thalassemia minor: Secondary | ICD-10-CM

## 2021-03-24 DIAGNOSIS — Z148 Genetic carrier of other disease: Secondary | ICD-10-CM

## 2021-03-24 DIAGNOSIS — O34219 Maternal care for unspecified type scar from previous cesarean delivery: Secondary | ICD-10-CM

## 2021-03-24 DIAGNOSIS — Z3480 Encounter for supervision of other normal pregnancy, unspecified trimester: Secondary | ICD-10-CM

## 2021-03-24 NOTE — Progress Notes (Signed)
   PRENATAL VISIT NOTE  Subjective:  Lori Baldwin is a 25 y.o. [phone] at [redacted]w[redacted]d being seen today for ongoing prenatal care.  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  Patient doing well with no acute concerns today. She reports headache.  Occasional HA, Contractions: Not present. Vag. Bleeding: None.  Movement: Present. Denies leaking of fluid.   The following portions of the patient's history were reviewed and updated as appropriate: allergies, current medications, past family history, past medical history, past social history, past surgical history and problem list. Problem list updated.  Objective:   Vitals:   03/24/21 1025  BP: 102/63  Pulse: (!) 113  Weight: 110 lb 9.6 oz (50.2 kg)    Fetal Status: Fetal Heart Rate (bpm): 156 Fundal Height: 17 cm Movement: Present     General:  Alert, oriented and cooperative. Patient is in no acute distress.  Skin: Skin is warm and dry. No rash noted.   Cardiovascular: Normal heart rate noted  Respiratory: Normal respiratory effort, no problems with respiration noted  Abdomen: Soft, gravid, appropriate for gestational age.  Pain/Pressure: Present     Pelvic: Cervical exam deferred        Extremities: Normal range of motion.  Edema: None  Mental Status:  Normal mood and affect. Normal behavior. Normal judgment and thought content.   Assessment and Plan:  Pregnancy: G4P1021 at [redacted]w[redacted]d  1. Supervision of other normal pregnancy, antepartum Continue routine care - AFP, Serum, Open Spina Bifida  2. Previous cesarean delivery affecting pregnancy, antepartum Pt desires TOLAC, will need consultation and consent later in pregnancy  3. [redacted] weeks  gestation of pregnancy   4. 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 Alpha thalassemia silent carrier   Preterm labor symptoms and general obstetric precautions including but not limited to vaginal bleeding, contractions, leaking of fluid and fetal movement were reviewed in detail with the patient.  Please refer to After Visit Summary for other counseling recommendations.   Return in about 4 weeks (around 04/21/2021) for ROB, virtual.   Mariel Aloe, MD Faculty Attending Center for [facility]

## 2021-03-26 LAB — AFP, SERUM, OPEN SPINA BIFIDA
AFP MoM: 1.36
AFP Value: 68.4 ng/mL
Gest. Age on Collection Date: 16.4 weeks
Maternal Age At EDD: 26.5 yr
OSBR Risk 1 IN: 8068
Test Results:: NEGATIVE
Weight: 110 [lb_av]

## 2021-04-10 ENCOUNTER — Ambulatory Visit: Payer: Medicaid Other | Attending: Obstetrics and Gynecology

## 2021-04-10 ENCOUNTER — Other Ambulatory Visit: Payer: Self-pay | Admitting: Obstetrics and Gynecology

## 2021-04-10 ENCOUNTER — Other Ambulatory Visit: Payer: Self-pay

## 2021-04-10 DIAGNOSIS — O34219 Maternal care for unspecified type scar from previous cesarean delivery: Secondary | ICD-10-CM | POA: Diagnosis not present

## 2021-04-10 DIAGNOSIS — Z148 Genetic carrier of other disease: Secondary | ICD-10-CM | POA: Diagnosis not present

## 2021-04-10 DIAGNOSIS — Z3480 Encounter for supervision of other normal pregnancy, unspecified trimester: Secondary | ICD-10-CM | POA: Diagnosis present

## 2021-04-10 DIAGNOSIS — Z3A19 19 weeks gestation of pregnancy: Secondary | ICD-10-CM

## 2021-04-10 DIAGNOSIS — Z363 Encounter for antenatal screening for malformations: Secondary | ICD-10-CM | POA: Diagnosis not present

## 2021-04-11 ENCOUNTER — Other Ambulatory Visit (HOSPITAL_COMMUNITY)
Admission: RE | Admit: 2021-04-11 | Discharge: 2021-04-11 | Disposition: A | Discharge status: Home / Self Care | Payer: Medicaid Other | Source: Ambulatory Visit | Attending: Family Medicine | Admitting: Family Medicine

## 2021-04-11 ENCOUNTER — Ambulatory Visit (INDEPENDENT_AMBULATORY_CARE_PROVIDER_SITE_OTHER): Payer: Medicaid Other

## 2021-04-11 VITALS — BP 103/61 | HR 87 | Wt 115.2 lb

## 2021-04-11 DIAGNOSIS — N898 Other specified noninflammatory disorders of vagina: Secondary | ICD-10-CM | POA: Insufficient documentation

## 2021-04-11 DIAGNOSIS — Z3480 Encounter for supervision of other normal pregnancy, unspecified trimester: Secondary | ICD-10-CM

## 2021-04-11 MED ORDER — TERCONAZOLE 0.4 % VA CREA: 1.0000 | TOPICAL_CREAM | Freq: Every day | VAGINAL | 0 refills | Status: DC

## 2021-04-11 NOTE — Progress Notes (Signed)
Here today at 19w 1d of pregnancy with complaint of vaginal itching with thick, white discharge. Patient reports increased sensitivity to vaginal area. Reassured pt this can be normal during pregnancy and she is more susceptible to yeast infections while pregnant. Terazol ordered per protocol. Self swab instructions given and specimen obtained. Explained we will contact patient with any abnormal results. Pt will return for routine OB appt with Mathis Fare, MD on 04/19/21.   Fleet Contras RN 04/11/21

## 2021-04-11 NOTE — Progress Notes (Signed)
Patient was evaluated by nursing staff. Agree with assessment and plan.  °

## 2021-04-12 ENCOUNTER — Encounter: Payer: Self-pay | Admitting: Family Medicine

## 2021-04-12 LAB — CERVICOVAGINAL ANCILLARY ONLY
Bacterial Vaginitis (gardnerella): POSITIVE — AB
Candida Glabrata: NEGATIVE
Candida Vaginitis: POSITIVE — AB
Chlamydia: NEGATIVE
Comment: NEGATIVE
Comment: NEGATIVE
Comment: NEGATIVE
Comment: NEGATIVE
Comment: NEGATIVE
Comment: NORMAL
Neisseria Gonorrhea: NEGATIVE
Trichomonas: NEGATIVE

## 2021-04-13 ENCOUNTER — Telehealth: Payer: Self-pay | Admitting: Family Medicine

## 2021-04-13 MED ORDER — METRONIDAZOLE 500 MG PO TABS: 500.0000 mg | ORAL_TABLET | Freq: Two times a day (BID) | ORAL | 0 refills | Status: DC

## 2021-04-13 NOTE — Telephone Encounter (Signed)
Patient said she was told she had BV, she want to know if she can get some meditation called in

## 2021-04-13 NOTE — Telephone Encounter (Signed)
Returned patients call.   Informed her that she has BV and yeast . Flagyl sent to patients Pharmacy. She reports she has no picked up the Terazol as at the incorrect Pharmacy. Advised to call [facility] and have then transfer the prescription and she can pick both up.   Advised to take all medication as prescribed and to take Flagyl with food. Patient voiced understanding.

## 2021-04-19 ENCOUNTER — Telehealth: Payer: Medicaid Other | Admitting: Family Medicine

## 2021-05-15 ENCOUNTER — Telehealth: Payer: Self-pay | Admitting: General Practice

## 2021-05-15 NOTE — Telephone Encounter (Signed)
Patient called into front office requesting a callback.  Called patient and she states for the past 5 days she has been sick. Patient reports severe congestion, sore throat, & feeling fatigued. She has tried mucinex max strength, face steams, nasal sprays, and gargles but nothing is helping and she feels it is only getting worse. Denies fever or body aches. Patient reports at home covid test was negative. She states she feels like she can hardly breathe out her nose making it difficult to eat/drink. Patient does endorse good fetal movement. Advised patient that unfortunately if this is truly a cold not much can be done other than what she is trying. Recommended she discontinue mucinex max strength as Phenylphrine is a listed ingredient. Told patient she can go to urgent care for other testing like flu or strep & provided directions. Patient verbalized understanding.

## 2021-05-17 ENCOUNTER — Telehealth (INDEPENDENT_AMBULATORY_CARE_PROVIDER_SITE_OTHER): Payer: Medicaid Other | Admitting: Nurse Practitioner

## 2021-05-17 ENCOUNTER — Encounter: Payer: Self-pay | Admitting: Nurse Practitioner

## 2021-05-17 VITALS — BP 93/67 | HR 80

## 2021-05-17 DIAGNOSIS — O34219 Maternal care for unspecified type scar from previous cesarean delivery: Secondary | ICD-10-CM

## 2021-05-17 DIAGNOSIS — Z3A24 24 weeks gestation of pregnancy: Secondary | ICD-10-CM

## 2021-05-17 DIAGNOSIS — Z3480 Encounter for supervision of other normal pregnancy, unspecified trimester: Secondary | ICD-10-CM

## 2021-05-17 NOTE — Progress Notes (Signed)
I connected with  Lori Baldwin on 05/17/21 at  2:55 PM EST by MyChart Virtual Video Visit and verified that I am speaking with the correct person using two identifiers.   I discussed the limitations, risks, security and privacy concerns of performing an evaluation and management service by telephone and the availability of in person appointments. I also discussed with the patient that there may be a patient responsible charge related to this service. The patient expressed understanding and agreed to proceed.  Guy Begin, CMA 05/17/2021  3:00 PM

## 2021-05-18 NOTE — Progress Notes (Signed)
° °  OBSTETRICS PRENATAL VIRTUAL VISIT ENCOUNTER NOTE  Provider location: Center for [facility]   Patient location: Home  I connected with Lori Baldwin on 05/18/21 at  2:55 PM EST by MyChart Video Encounter and verified that I am speaking with the correct person using two identifiers. I discussed the limitations, risks, security and privacy concerns of performing an evaluation and management service virtually and the availability of in person appointments. I also discussed with the patient that there may be a patient responsible charge related to this service. The patient expressed understanding and agreed to proceed. Subjective:  Lori Baldwin is a 25 y.o. [phone] at 107w3d being seen today for ongoing prenatal care.  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  Patient reports  URI is improving .  Contractions: Not present. Vag. Bleeding: None.  Movement: Present. Denies any leaking of fluid.   The following portions of the patient's history were reviewed and updated as appropriate: allergies, current medications, past family history, past medical history, past social history, past surgical history and problem list.   Objective:   Vitals:   05/17/21 1503  BP: 93/67  Pulse: 80    Fetal Status:     Movement: Present     General:  Alert, oriented and cooperative. Patient is in no acute distress.  Respiratory: Normal respiratory effort, no problems with respiration noted  Mental Status: Normal mood and affect. Normal behavior. Normal judgment and thought content.  Rest of physical exam deferred due to type of encounter  Imaging: No results found.  Assessment and Plan:  Pregnancy: G4P1021  at [redacted]w[redacted]d 1. Supervision of other normal pregnancy, antepartum Reviewed medications to consider for nasal congestion Claritin OR zyrtec AND Flonase if desired  2. Previous cesarean delivery affecting pregnancy, antepartum Will need to see MD for delivery plan  Preterm labor symptoms and general obstetric precautions including but not limited to vaginal bleeding, contractions, leaking of fluid and fetal movement were reviewed in detail with the patient. I discussed the assessment and treatment plan with the patient. The patient was provided an opportunity to ask questions and all were answered. The patient agreed with the plan and demonstrated an understanding of the instructions. The patient was advised to call back or seek an in-person office evaluation/go to MAU at [facility] for any urgent or concerning symptoms. Please refer to After Visit Summary for other counseling recommendations.   I provided 10 minutes of face-to-face time during this encounter.  Return in about 4 weeks (around 06/14/2021) for early AM appt - for ROB and fasting glucose.  Message sent to admin staff to schedule next appt.  Currie Paris, NP Center for [employer], [facility]

## 2021-05-27 ENCOUNTER — Encounter (HOSPITAL_COMMUNITY): Payer: Self-pay | Admitting: Obstetrics & Gynecology

## 2021-05-27 ENCOUNTER — Other Ambulatory Visit: Payer: Self-pay

## 2021-05-27 ENCOUNTER — Inpatient Hospital Stay (HOSPITAL_COMMUNITY)
Admission: AD | Admit: 2021-05-27 | Discharge: 2021-05-27 | Disposition: A | Discharge status: Home / Self Care | Payer: Medicaid Other | Attending: Obstetrics & Gynecology | Admitting: Obstetrics & Gynecology

## 2021-05-27 DIAGNOSIS — Z3689 Encounter for other specified antenatal screening: Secondary | ICD-10-CM

## 2021-05-27 DIAGNOSIS — O34219 Maternal care for unspecified type scar from previous cesarean delivery: Secondary | ICD-10-CM | POA: Diagnosis not present

## 2021-05-27 DIAGNOSIS — O26892 Other specified pregnancy related conditions, second trimester: Secondary | ICD-10-CM | POA: Diagnosis present

## 2021-05-27 DIAGNOSIS — Z3A25 25 weeks gestation of pregnancy: Secondary | ICD-10-CM | POA: Insufficient documentation

## 2021-05-27 DIAGNOSIS — N858 Other specified noninflammatory disorders of uterus: Secondary | ICD-10-CM | POA: Diagnosis not present

## 2021-05-27 DIAGNOSIS — Z3482 Encounter for supervision of other normal pregnancy, second trimester: Secondary | ICD-10-CM | POA: Diagnosis not present

## 2021-05-27 NOTE — MAU Note (Signed)
Pt states that her boyfriend is waiting for her outside and she is not able to wait for vitals or discharge papers. CNM aware.

## 2021-05-27 NOTE — MAU Provider Note (Signed)
Patient Lori Baldwin is a 25 y.o. [phone]  At [redacted]w[redacted]d here with complaints of excessive fetal movements. She denies vaginal bleeding, dysuria, fever, abdominal pain, contractions, LOF. She endorses strong fetal movements.   She denies any complications in her pregnancy; she denies any other complaints. She has a history of one c/section.  History     CSN: [id number]  Arrival date and time: 05/27/21 1241   Event Date/Time   First Provider Initiated Contact with Patient 05/27/21 1350      Chief Complaint  Patient presents with   excessive fetal movement   HPI Patient states that her baby normally moves at 7 am, 10 am and 2 pm every day. Last night, the baby moved "a lot" and she could not sleep. She reports that her boyfriend was concerned and so her suggested that she come in to be checked out.  OB History     Gravida  4   Para  1   Term  1   Preterm      AB  2   Living  1      SAB  1   IAB      Ectopic  1   Multiple      Live Births  1        Obstetric Comments  C-section due to FHR changes         Past Medical History:  Diagnosis Date   Anemia    BV (bacterial vaginosis)    Gonorrhea    Headache    Pharyngitis    Yeast vaginitis     Past Surgical History:  Procedure Laterality Date   CESAREAN SECTION      Family History  Problem Relation Age of Onset   Hypertension Mother    Cancer Maternal Grandmother        breast   Diabetes Maternal Grandfather    Sickle cell anemia Paternal Grandmother     Social History   Tobacco Use   Smoking status: Never   Smokeless tobacco: Never  Vaping Use   Vaping Use: Never used  Substance Use Topics   Alcohol use: No   Drug use: Not Currently    Types: Marijuana    Comment: last was early Dec    Allergies:  Allergies  Allergen Reactions   Aspirin Itching    Pt states she does fine when she takes ibuprofen      Medications Prior to Admission  Medication Sig Dispense Refill Last Dose    Blood Pressure Monitoring DEVI 1 each by Does not apply route once a week. 1 each 0    metroNIDAZOLE (FLAGYL) 500 MG tablet Take 1 tablet (500 mg total) by mouth 2 (two) times daily. (Patient not taking: Reported on 05/17/2021) 14 tablet 0    Misc. Devices (GOJJI WEIGHT SCALE) MISC 1 Device by Does not apply route as needed. (Patient not taking: Reported on 05/17/2021) 1 each 0    Prenatal Vit-Fe Fumarate-FA (PREPLUS) 27-1 MG TABS Take by mouth.      terconazole (TERAZOL 7) 0.4 % vaginal cream Place 1 applicator vaginally at bedtime. (Patient not taking: Reported on 05/17/2021) 45 g 0     Review of Systems  Constitutional: Negative.   HENT: Negative.    Eyes: Negative.   Respiratory: Negative.    Cardiovascular: Negative.   Gastrointestinal: Negative.   Genitourinary: Negative.   Neurological: Negative.   Hematological: Negative.   Psychiatric/Behavioral: Negative.    Physical Exam  Blood pressure 101/64, pulse 83, temperature 98.9 F (37.2 C), temperature source Oral, resp. rate 16, last menstrual period 11/16/2020, SpO2 99 %, unknown if currently breastfeeding.  Physical Exam Pulmonary:     Effort: Pulmonary effort is normal.  Abdominal:     General: Abdomen is flat.  Musculoskeletal:        General: Normal range of motion.     Cervical back: Normal range of motion.  Skin:    General: Skin is warm.  Neurological:     Mental Status: She is alert.    MAU Course  Procedures  MDM NST: 150 bpm, mod var, present acel, no decels, no contractions  Patient felt strong fetal movements while in MAU.   Assessment and Plan   1. NST (non-stress test) reactive   2. [redacted] weeks gestation of pregnancy    -patient stable for discharge with long discussion about normalcy of fetal movements.  -I explained how movements may change in during pregnancy, and that active fetal movements are a sign that baby is healthy and growing and with enough amniotic fluid. I explained when to start  fetal kick counts and how to monitor movements; patient can always return to MAU if she has any concerns about movements.  -Patient appreciated the advice.  -Reviewed other warning precautions and when to come to MAU; patient verbalized understanding and will keep her upcoming PNV.  Charlesetta Garibaldi Javarius Tsosie 05/27/2021, 1:53 PM

## 2021-05-27 NOTE — MAU Note (Signed)
Lori Baldwin is a 25 y.o. at [redacted]w[redacted]d here in MAU reporting: since last night around 2200, the baby has been moving constantly which is much more than usual. States normally the baby takes breaks. Denies pain or bleeding. Had 1 episode of clear watery discharge today.  Onset of complaint: yesterday  Pain score: 0/10  Vitals:   05/27/21 1305  BP: 101/64  Pulse: 83  Resp: 16  Temp: 98.9 F (37.2 C)  SpO2: 99%     FHT:EFM applied  Lab orders placed from triage: none

## 2021-06-07 ENCOUNTER — Telehealth: Payer: Self-pay | Admitting: Family Medicine

## 2021-06-07 MED ORDER — PREPLUS 27-1 MG PO TABS: 1.0000 | ORAL_TABLET | Freq: Every day | ORAL | 6 refills | Status: DC

## 2021-06-07 NOTE — Telephone Encounter (Signed)
Pt notified PNV sent to [facility] on [address]    Leonette Nutting  06/07/21

## 2021-06-07 NOTE — Telephone Encounter (Signed)
Rx for Prenatal Vit-Fe Fumarate

## 2021-06-14 ENCOUNTER — Other Ambulatory Visit: Payer: Self-pay

## 2021-06-14 DIAGNOSIS — Z3480 Encounter for supervision of other normal pregnancy, unspecified trimester: Secondary | ICD-10-CM

## 2021-06-16 ENCOUNTER — Other Ambulatory Visit: Payer: Medicaid Other

## 2021-06-16 ENCOUNTER — Ambulatory Visit (INDEPENDENT_AMBULATORY_CARE_PROVIDER_SITE_OTHER): Payer: Medicaid Other | Admitting: Obstetrics & Gynecology

## 2021-06-16 ENCOUNTER — Other Ambulatory Visit: Payer: Self-pay

## 2021-06-16 ENCOUNTER — Encounter: Payer: Self-pay | Admitting: Obstetrics & Gynecology

## 2021-06-16 VITALS — BP 110/62 | HR 106 | Wt 122.0 lb

## 2021-06-16 DIAGNOSIS — Z3A28 28 weeks gestation of pregnancy: Secondary | ICD-10-CM

## 2021-06-16 DIAGNOSIS — Z23 Encounter for immunization: Secondary | ICD-10-CM | POA: Diagnosis not present

## 2021-06-16 DIAGNOSIS — Z3480 Encounter for supervision of other normal pregnancy, unspecified trimester: Secondary | ICD-10-CM

## 2021-06-16 DIAGNOSIS — O34219 Maternal care for unspecified type scar from previous cesarean delivery: Secondary | ICD-10-CM

## 2021-06-16 NOTE — Progress Notes (Signed)
° °  PRENATAL VISIT NOTE  Subjective:  Lori Baldwin is a 26 y.o. [phone] at [redacted]w[redacted]d being seen today for ongoing prenatal care.  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  Patient reports  pelvic pressure and leg cramps .  Contractions: Irritability. Vag. Bleeding: None.  Movement: Present. Denies leaking of fluid.   The following portions of the patient's history were reviewed and updated as appropriate: allergies, current medications, past family history, past medical history, past social history, past surgical history and problem list.   Objective:   Vitals:   06/16/21 0920  BP: 110/62  Pulse: (!) 106  Weight: 122 lb (55.3 kg)    Fetal Status: Fetal Heart Rate (bpm): 147 Fundal Height: 28 cm Movement: Present     General:  Alert, oriented and cooperative. Patient is in no acute distress.  Skin: Skin is warm and dry. No rash noted.   Cardiovascular: Normal heart rate noted  Respiratory: Normal respiratory effort, no problems with respiration noted  Abdomen: Soft, gravid, appropriate for gestational age.  Pain/Pressure: Present     Pelvic: Cervical exam performed in the presence of a chaperone Dilation: Closed Effacement (%): Thick Station: Ballotable  Extremities: Normal range of motion.  Edema: None  Mental Status: Normal mood and affect. Normal behavior. Normal judgment and thought content.   Assessment and Plan:  Pregnancy: G4P1021 at [redacted]w[redacted]d 1. Previous cesarean delivery affecting pregnancy, antepartum Counseled regarding TOLAC vs RCS; risks/benefits discussed in detail. All questions answered.  Patient elects for TOLAC, consent signed 06/16/2021.  2. [redacted] weeks gestation of pregnancy 3. Supervision of other  normal pregnancy, antepartum Third trimester labs checked, will follow up results and manage accordingly. Tdap given. Closed cervix on exam, patient reassured. Reassured about leg cramps, told to keep moving, consume more potassium.  - Tdap vaccine greater than or equal to 7yo IM  Preterm labor symptoms and general obstetric precautions including but not limited to vaginal bleeding, contractions, leaking of fluid and fetal movement were reviewed in detail with the patient. Please refer to After Visit Summary for other counseling recommendations.   Return in about 2 weeks (around 06/30/2021) for OFFICE OB VISIT (MD or APP).  No future appointments.  Jaynie Collins, MD

## 2021-06-17 LAB — CBC
Hematocrit: 26.9 % — ABNORMAL LOW (ref 34.0–46.6)
Hemoglobin: 8.7 g/dL — ABNORMAL LOW (ref 11.1–15.9)
MCH: 26.1 pg — ABNORMAL LOW (ref 26.6–33.0)
MCHC: 32.3 g/dL (ref 31.5–35.7)
MCV: 81 fL (ref 79–97)
NRBC: 1 % — ABNORMAL HIGH (ref 0–0)
Platelets: 161 10*3/uL (ref 150–450)
RBC: 3.33 x10E6/uL — ABNORMAL LOW (ref 3.77–5.28)
RDW: 14.5 % (ref 11.7–15.4)
WBC: 10.7 10*3/uL (ref 3.4–10.8)

## 2021-06-17 LAB — RPR: RPR Ser Ql: NONREACTIVE

## 2021-06-17 LAB — GLUCOSE TOLERANCE, 2 HOURS W/ 1HR
Glucose, 1 hour: 198 mg/dL — ABNORMAL HIGH (ref 70–179)
Glucose, 2 hour: 138 mg/dL (ref 70–152)
Glucose, Fasting: 90 mg/dL (ref 70–91)

## 2021-06-17 LAB — HIV ANTIBODY (ROUTINE TESTING W REFLEX): HIV Screen 4th Generation wRfx: NONREACTIVE

## 2021-06-19 ENCOUNTER — Telehealth: Payer: Self-pay

## 2021-06-19 ENCOUNTER — Other Ambulatory Visit: Payer: Self-pay | Admitting: Obstetrics & Gynecology

## 2021-06-19 DIAGNOSIS — O24419 Gestational diabetes mellitus in pregnancy, unspecified control: Secondary | ICD-10-CM

## 2021-06-19 DIAGNOSIS — D509 Iron deficiency anemia, unspecified: Secondary | ICD-10-CM | POA: Insufficient documentation

## 2021-06-19 MED ORDER — GLUCOSE BLOOD VI STRP: ORAL_STRIP | 12 refills | Status: DC

## 2021-06-19 MED ORDER — ACCU-CHEK GUIDE W/DEVICE KIT: 1.0000 | PACK | Freq: Four times a day (QID) | 0 refills | Status: DC

## 2021-06-19 MED ORDER — ACCU-CHEK SOFTCLIX LANCETS MISC: 12 refills | Status: DC

## 2021-06-19 NOTE — Telephone Encounter (Signed)
Call placed to Lori Baldwin. Spoke with Lori Baldwin. Lori Baldwin given results and recommendations per Dr Harolyn Rutherford. Lori Baldwin verbalized understanding. Supplies sent to pharmacy on file. Lori Baldwin aware to pick up and take to Diabetes class on 06/21/21. Colletta Maryland, RN

## 2021-06-19 NOTE — Telephone Encounter (Signed)
-----   Message from Osborne Oman, MD sent at 06/19/2021 12:00 PM EST ----- Venofer ordered x 3 weekly doses. Orders signed and held. Please call to inform patient of results and recommendations.

## 2021-06-19 NOTE — Telephone Encounter (Signed)
-----   Message from Osborne Oman, MD sent at 06/19/2021 11:57 AM EST ----- Patient's 2 hr GTT is abnormal and is consistent with Gestational Diabetes DM education referral placed, needs education and testing supplies Please call to inform patient of results and recommendations.

## 2021-06-19 NOTE — Assessment & Plan Note (Signed)
Venofer recommended CBC Latest Ref Rng & Units 06/16/2021 02/22/2021 01/01/2021  WBC 3.4 - 10.8 x10E3/uL 10.7 10.9(H) 8.2  Hemoglobin 11.1 - 15.9 g/dL 8.7(L) 10.5(L) 11.3(L)  Hematocrit 34.0 - 46.6 % 26.9(L) 32.5(L) 33.7(L)  Platelets 150 - 450 x10E3/uL 161 226 276

## 2021-06-19 NOTE — Telephone Encounter (Signed)
Call placed to pt. Spoke with pt. Pt given results and recommendations per Dr Harolyn Rutherford. Pt verbalized understanding and agreeable to plan of care. Pt scheduled for first Venofer injection on 2/6 at 9am. Pt agreeable to date and time of appt.  Colletta Maryland, RN

## 2021-06-21 ENCOUNTER — Encounter: Payer: Medicaid Other | Attending: Obstetrics & Gynecology | Admitting: Registered"

## 2021-06-21 ENCOUNTER — Other Ambulatory Visit: Payer: Self-pay

## 2021-06-21 DIAGNOSIS — O24419 Gestational diabetes mellitus in pregnancy, unspecified control: Secondary | ICD-10-CM | POA: Diagnosis not present

## 2021-06-22 ENCOUNTER — Telehealth: Payer: Self-pay

## 2021-06-22 MED ORDER — PROMETHAZINE HCL 25 MG PO TABS: 25.0000 mg | ORAL_TABLET | Freq: Four times a day (QID) | ORAL | 0 refills | Status: DC | PRN

## 2021-06-22 NOTE — Telephone Encounter (Signed)
Pt call transferred from the front office.  Pt states that she is nauseous when she wakes up in the morning and when she wakes up.  Pt also states that her blood sugars over the past couple of days have a little over a 100 when she first wakes up and then a little under 120 after she eats her meals.  I advised that her blood sugars at this time are concerning as it's over a couple of days.  I explained to the pt that the provider will look at her values over an extended amount time to manage her levels.   I asked pt if she wanted to have Phenergan prescribed for the nausea.  Pt agreed.  Phenergan e-prescribed per standing protocol.   Leonette Nutting  06/22/21

## 2021-06-24 ENCOUNTER — Encounter: Payer: Self-pay | Admitting: Registered"

## 2021-06-24 NOTE — Progress Notes (Signed)
Patient was seen on 06/21/21 for Gestational Diabetes self-management class at [facility].   Patient was not able to stay for the full class time.  The following learning objectives were met by the patient during this course:  States the definition of Gestational Diabetes States why dietary management is important in controlling blood glucose Describes the effects each nutrient has on blood glucose levels Demonstrates ability to create a balanced meal plan Demonstrates carbohydrate counting  States when to check blood glucose levels Demonstrates proper blood glucose monitoring techniques States the effect of stress and exercise on blood glucose levels States the importance of limiting caffeine and abstaining from alcohol and smoking  Blood glucose monitor given: Patient has meter and is checking blood sugar prior to class  Patient instructed to monitor glucose levels: FBS: 60 - <95; 1 hour: <140; 2 hour: <120  Patient received handouts: Nutrition Diabetes and Pregnancy, including carb counting list  Patient will be seen for follow-up as needed.

## 2021-06-29 ENCOUNTER — Telehealth: Payer: Self-pay | Admitting: Family Medicine

## 2021-06-29 ENCOUNTER — Other Ambulatory Visit: Payer: Self-pay

## 2021-06-29 ENCOUNTER — Ambulatory Visit (INDEPENDENT_AMBULATORY_CARE_PROVIDER_SITE_OTHER): Payer: Medicaid Other

## 2021-06-29 VITALS — BP 97/59 | HR 95 | Wt 123.0 lb

## 2021-06-29 DIAGNOSIS — O34219 Maternal care for unspecified type scar from previous cesarean delivery: Secondary | ICD-10-CM

## 2021-06-29 DIAGNOSIS — Z3A30 30 weeks gestation of pregnancy: Secondary | ICD-10-CM

## 2021-06-29 DIAGNOSIS — O0990 Supervision of high risk pregnancy, unspecified, unspecified trimester: Secondary | ICD-10-CM

## 2021-06-29 DIAGNOSIS — O24419 Gestational diabetes mellitus in pregnancy, unspecified control: Secondary | ICD-10-CM

## 2021-06-29 NOTE — Progress Notes (Signed)
Patient reports vaginal pressure. Stated that she is not concerned about it. Lori Baldwin did not bring her glucose log today but reports sugar readings have been over 120 after "breakfast"     No further questions or concerns

## 2021-06-29 NOTE — Telephone Encounter (Signed)
Called pt. Encouraged pt to speak with workplace about her options for maternity leave. Pt asks if she is considered high risk because of gestational diabetes. I explained that this is a high risk pregnancy, but we would not recommend that she stop working at this point in her pregnancy.

## 2021-06-29 NOTE — Progress Notes (Signed)
° °  PRENATAL VISIT NOTE  Subjective:  Lori Baldwin is a 26 y.o. [phone] at 68w3dbeing seen today for ongoing prenatal care.  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  Patient reports no complaints.  Contractions: Not present. Vag. Bleeding: None.  Movement: Present. Denies leaking of fluid.   The following portions of the patient's history were reviewed and updated as appropriate: allergies, current medications, past family history, past medical history, past social history, past surgical history and problem list.   Objective:   Vitals:   06/29/21 1114  BP: (!) 97/59  Pulse: 95  Weight: 123 lb (55.8 kg)    Fetal Status: Fetal Heart Rate (bpm): 145 Fundal Height: 30 cm Movement: Present     General:  Alert, oriented and cooperative. Patient is in no acute distress.  Skin: Skin is warm and dry. No rash noted.   Cardiovascular: Normal heart rate noted  Respiratory: Normal respiratory effort, no problems with respiration noted  Abdomen: Soft, gravid, appropriate for gestational age.  Pain/Pressure: Present     Pelvic: Cervical exam deferred        Extremities: Normal range of motion.     Mental Status: Normal mood and affect. Normal behavior. Normal judgment and thought content.   Assessment and Plan:  Pregnancy: G4P1021 at 343w3d. Supervision of high risk pregnancy, antepartum - Routine OB. Doing well. No concerns - Anticipatory guidance for upcoming appointments provided  - [place] OB FOLLOW UP; Future  2. [redacted] weeks gestation of pregnancy   3. Previous cesarean delivery affecting pregnancy,  antepartum - TOLAC consent signed on 1/20  4. Gestational diabetes mellitus (GDM) in third trimester, gestational diabetes method of control unspecified - Met with diabetes educator on 1/25 - Did not bring BS log today. Says after lunch/dinner numbers have been "above 120". Diet recall with patient reporting eating "sugary foods". Encouraged patient to focus more on vegetables, proteins, and whole grains. Continue checking BS and bring log to next appointment   Preterm labor symptoms and general obstetric precautions including but not limited to vaginal bleeding, contractions, leaking of fluid and fetal movement were reviewed in detail with the patient. Please refer to After Visit Summary for other counseling recommendations.   Return in about 2 weeks (around 07/13/2021).  Future Appointments  Date Time Provider [place]2/10/2021  9:00 AM [id number] None  07/17/2021  4:15 PM DuRadene GunningMD [facility] ElochomanCNM

## 2021-06-29 NOTE — Telephone Encounter (Signed)
Patient called in with questions regarding disability paper work, when I asked if she had brought in FMLA papers she stated not yet but she saw something about disability instead. She wants to speak with a nurse about these questions.

## 2021-07-03 ENCOUNTER — Other Ambulatory Visit: Payer: Self-pay

## 2021-07-03 ENCOUNTER — Encounter (HOSPITAL_COMMUNITY)
Admission: RE | Admit: 2021-07-03 | Discharge: 2021-07-03 | Disposition: A | Discharge status: Home / Self Care | Payer: Medicaid Other | Source: Ambulatory Visit | Attending: Obstetrics & Gynecology | Admitting: Obstetrics & Gynecology

## 2021-07-03 DIAGNOSIS — O24419 Gestational diabetes mellitus in pregnancy, unspecified control: Secondary | ICD-10-CM | POA: Insufficient documentation

## 2021-07-03 DIAGNOSIS — D509 Iron deficiency anemia, unspecified: Secondary | ICD-10-CM | POA: Diagnosis not present

## 2021-07-03 DIAGNOSIS — O99013 Anemia complicating pregnancy, third trimester: Secondary | ICD-10-CM | POA: Insufficient documentation

## 2021-07-03 MED ORDER — SODIUM CHLORIDE 0.9 % IV SOLN
300.0000 mg | INTRAVENOUS | Status: DC
  Administered 2021-07-03: 300 mg via INTRAVENOUS
  Filled 2021-07-03: qty 300

## 2021-07-06 ENCOUNTER — Other Ambulatory Visit: Payer: Self-pay | Admitting: Obstetrics & Gynecology

## 2021-07-06 ENCOUNTER — Telehealth: Payer: Self-pay

## 2021-07-06 DIAGNOSIS — O24419 Gestational diabetes mellitus in pregnancy, unspecified control: Secondary | ICD-10-CM

## 2021-07-06 NOTE — Telephone Encounter (Signed)
Pt called transferred from the front office.  Pt stated that she was suppose to get an U/S scheduled at her last appt and was never scheduled.  I advised pt that I would schedule her appt in the morning per her request and she will be able to get appt via MyChart.  U/S scheduled for 07/12/21 @ 1100.    Leonette Nutting  07/06/21

## 2021-07-10 ENCOUNTER — Encounter (HOSPITAL_COMMUNITY)
Admission: RE | Admit: 2021-07-10 | Discharge: 2021-07-10 | Disposition: A | Discharge status: Home / Self Care | Payer: Medicaid Other | Source: Ambulatory Visit | Attending: Obstetrics & Gynecology | Admitting: Obstetrics & Gynecology

## 2021-07-10 ENCOUNTER — Other Ambulatory Visit: Payer: Self-pay

## 2021-07-10 DIAGNOSIS — O99013 Anemia complicating pregnancy, third trimester: Secondary | ICD-10-CM | POA: Diagnosis not present

## 2021-07-10 MED ORDER — SODIUM CHLORIDE 0.9 % IV SOLN
300.0000 mg | INTRAVENOUS | Status: DC
  Administered 2021-07-10: 300 mg via INTRAVENOUS
  Filled 2021-07-10: qty 300

## 2021-07-12 ENCOUNTER — Other Ambulatory Visit: Payer: Self-pay | Admitting: *Deleted

## 2021-07-12 ENCOUNTER — Ambulatory Visit: Payer: Medicaid Other

## 2021-07-12 ENCOUNTER — Ambulatory Visit (HOSPITAL_BASED_OUTPATIENT_CLINIC_OR_DEPARTMENT_OTHER): Payer: Medicaid Other | Admitting: Obstetrics and Gynecology

## 2021-07-12 ENCOUNTER — Ambulatory Visit: Payer: Medicaid Other | Admitting: *Deleted

## 2021-07-12 ENCOUNTER — Other Ambulatory Visit: Payer: Self-pay

## 2021-07-12 VITALS — BP 104/59 | HR 100

## 2021-07-12 DIAGNOSIS — O0990 Supervision of high risk pregnancy, unspecified, unspecified trimester: Secondary | ICD-10-CM | POA: Insufficient documentation

## 2021-07-12 DIAGNOSIS — Z3A32 32 weeks gestation of pregnancy: Secondary | ICD-10-CM | POA: Insufficient documentation

## 2021-07-12 DIAGNOSIS — O99013 Anemia complicating pregnancy, third trimester: Secondary | ICD-10-CM | POA: Diagnosis present

## 2021-07-12 DIAGNOSIS — O34219 Maternal care for unspecified type scar from previous cesarean delivery: Secondary | ICD-10-CM

## 2021-07-12 DIAGNOSIS — O24410 Gestational diabetes mellitus in pregnancy, diet controlled: Secondary | ICD-10-CM

## 2021-07-12 DIAGNOSIS — D509 Iron deficiency anemia, unspecified: Secondary | ICD-10-CM | POA: Insufficient documentation

## 2021-07-12 DIAGNOSIS — D563 Thalassemia minor: Secondary | ICD-10-CM

## 2021-07-12 DIAGNOSIS — O24419 Gestational diabetes mellitus in pregnancy, unspecified control: Secondary | ICD-10-CM

## 2021-07-12 NOTE — Progress Notes (Signed)
Maternal-Fetal Medicine   Name: Lori Baldwin DOB: 09/24/1995 MRN: [id number] Referring Provider: Radene Gunning, MD  I had the pleasure of seeing [name] [facility] at [facility]. She is G4 P1 at 32w 2d gestation and is here for fetal growth assessment.  She has a new diagnosis of gestational diabetes.  She has been regularly checking her blood glucose levels and reports that both fasting and postprandial levels are within normal range.  Blood pressure today at her office is 104/59 mmHg.  Obstetric history significant for a term cesarean delivery.  Ultrasound Fetal growth is appropriate for gestational age.  Amniotic fluid is normal and good fetal activity seen.  Breech presentation.  Gestational diabetes I explained the diagnosis of gestational diabetes.  I emphasized the importance of good blood glucose control to prevent adverse fetal or neonatal outcomes.  I discussed blood glucose normal values. I encouraged her to check her blood glucose regularly. Possible complications of gestational diabetes include fetal macrosomia, shoulder dystocia and birth injuries, stillbirth (in poorly controlled diabetes) and neonatal respiratory syndrome and other complications.  In about 85% of cases, gestational diabetes is well controlled by diet alone.  Exercise reduces the need for insulin.  Medical treatment includes oral hypoglycemics or insulin. If patient requires insulin or oral hypoglycemics, we recommend weekly antenatal testing till delivery.  Timing of delivery: In well-controlled diabetes on diet, patient can be delivered at 16- or 40-weeks' gestation. Vaginal delivery is not contraindicated. Type 2 diabetes develops in about 25% to 40% of women with GDM. I recommend postpartum screening with 75-g glucose load at 6 to 12 weeks after delivery. Recommendations -An appointment was made for her to return in 4 weeks for fetal growth assessment.  Thank you for consultation.   If you have any questions or concerns, please contact me the [facility].  Consultation including face-to-face (more than 50%) counseling 30 minutes.

## 2021-07-17 ENCOUNTER — Other Ambulatory Visit (HOSPITAL_COMMUNITY)
Admission: RE | Admit: 2021-07-17 | Discharge: 2021-07-17 | Disposition: A | Discharge status: Home / Self Care | Payer: Medicaid Other | Source: Ambulatory Visit | Attending: Obstetrics and Gynecology | Admitting: Obstetrics and Gynecology

## 2021-07-17 ENCOUNTER — Ambulatory Visit (INDEPENDENT_AMBULATORY_CARE_PROVIDER_SITE_OTHER): Payer: Medicaid Other | Admitting: Obstetrics and Gynecology

## 2021-07-17 ENCOUNTER — Encounter: Payer: Self-pay | Admitting: Obstetrics and Gynecology

## 2021-07-17 ENCOUNTER — Inpatient Hospital Stay (HOSPITAL_COMMUNITY): Admission: RE | Admit: 2021-07-17 | Payer: Medicaid Other | Source: Ambulatory Visit

## 2021-07-17 ENCOUNTER — Other Ambulatory Visit: Payer: Self-pay

## 2021-07-17 VITALS — BP 90/55 | HR 98 | Wt 128.0 lb

## 2021-07-17 DIAGNOSIS — N898 Other specified noninflammatory disorders of vagina: Secondary | ICD-10-CM | POA: Insufficient documentation

## 2021-07-17 DIAGNOSIS — O99013 Anemia complicating pregnancy, third trimester: Secondary | ICD-10-CM

## 2021-07-17 DIAGNOSIS — M549 Dorsalgia, unspecified: Secondary | ICD-10-CM

## 2021-07-17 DIAGNOSIS — O09899 Supervision of other high risk pregnancies, unspecified trimester: Secondary | ICD-10-CM

## 2021-07-17 DIAGNOSIS — Z2839 Other underimmunization status: Secondary | ICD-10-CM

## 2021-07-17 DIAGNOSIS — D509 Iron deficiency anemia, unspecified: Secondary | ICD-10-CM

## 2021-07-17 DIAGNOSIS — O99891 Other specified diseases and conditions complicating pregnancy: Secondary | ICD-10-CM

## 2021-07-17 DIAGNOSIS — O24419 Gestational diabetes mellitus in pregnancy, unspecified control: Secondary | ICD-10-CM

## 2021-07-17 DIAGNOSIS — O34219 Maternal care for unspecified type scar from previous cesarean delivery: Secondary | ICD-10-CM

## 2021-07-17 DIAGNOSIS — O0993 Supervision of high risk pregnancy, unspecified, third trimester: Secondary | ICD-10-CM

## 2021-07-17 NOTE — Progress Notes (Signed)
° °  PRENATAL VISIT NOTE  Subjective:  Lori Baldwin is a 26 y.o. [phone] at [redacted]w[redacted]d being seen today for ongoing prenatal care.  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  Patient reports  reports LOF. She stated that it isn't running down her legs but feels as though she is "peeing" on herself. Patient stated that the LOF started yesterday.   She also notes back pain. Contractions: Not present. Vag. Bleeding: None.  Movement: Present.   The following portions of the patient's history were reviewed and updated as appropriate: allergies, current medications, past family history, past medical history, past social history, past surgical history and problem list.   Objective:   Vitals:   07/17/21 1637  BP: (!) 90/55  Pulse: 98  Weight: 128 lb (58.1 kg)    Fetal Status: Fetal Heart Rate (bpm): 151 Fundal Height: 33 cm Movement: Present     General:  Alert, oriented and cooperative. Patient is in no acute distress.  Skin: Skin is warm and dry. No rash noted.   Cardiovascular: Normal heart rate noted  Respiratory: Normal respiratory effort, no problems with respiration noted  Abdomen: Soft, gravid, appropriate for gestational age.  Pain/Pressure: Absent     Pelvic: Cervical exam performed in the presence of a chaperone Dilation: Closed      Extremities: Normal range of motion.     Mental Status: Normal mood and affect. Normal behavior. Normal judgment and thought content.   Assessment and Plan:  Pregnancy: [id number] at [redacted]w[redacted]d 1. Gestational diabetes mellitus (GDM) in third trimester, gestational diabetes method of  control unspecified - CBGS reviewed - fastings range from 85-95 with occasional 100. She notes normal PP values around 115.  - Growth on 2/15 was normal - next on 3/17 - No meds  2. Supervision of high risk pregnancy in third trimester - Fern/pool negative - BV/yeast swab done - For back pain - will refer to PT  3. Previous cesarean delivery affecting pregnancy, antepartum - Signed tolac consent on 1/20  4. Rubella non-immune status, antepartum - MMR after delivery  5. Maternal iron deficiency anemia complicating pregnancy in third trimester - Hgb was 8.7 - IV iron ordered on 1/23. She has completed two doses - Check cbc next appt  6. Vaginal discharge - Swab done for this today - Cervicovaginal ancillary only( [place])  Preterm labor symptoms and general obstetric precautions including but not limited to vaginal bleeding, contractions, leaking of fluid and fetal movement were reviewed in detail with the patient. Please refer to After Visit Summary for other counseling recommendations.   Return in about 2 weeks (around 07/31/2021) for OB VISIT, MD or APP.  Future Appointments  Date Time Provider [place]  07/25/2021  9:00 AM MCINF-RM4 [facility]-MC[facility] None  08/01/2021 11:15 AM Chancy Milroy, MD [facility]  08/11/2021  9:30 AM [facility] NURSE [facility]  08/11/2021  9:45 AM W[facility]-MFC US4 [facility]US [facility]    Radene Gunning, MD

## 2021-07-19 LAB — CERVICOVAGINAL ANCILLARY ONLY
Bacterial Vaginitis (gardnerella): NEGATIVE
Candida Glabrata: NEGATIVE
Candida Vaginitis: NEGATIVE
Chlamydia: NEGATIVE
Comment: NEGATIVE
Comment: NEGATIVE
Comment: NEGATIVE
Comment: NEGATIVE
Comment: NEGATIVE
Comment: NORMAL
Neisseria Gonorrhea: NEGATIVE
Trichomonas: NEGATIVE

## 2021-07-25 ENCOUNTER — Other Ambulatory Visit: Payer: Self-pay

## 2021-07-25 ENCOUNTER — Inpatient Hospital Stay (HOSPITAL_COMMUNITY)
Admission: AD | Admit: 2021-07-25 | Discharge: 2021-07-25 | Disposition: A | Discharge status: Home / Self Care | Payer: Medicaid Other | Attending: Obstetrics & Gynecology | Admitting: Obstetrics & Gynecology

## 2021-07-25 ENCOUNTER — Encounter (HOSPITAL_COMMUNITY)
Admission: RE | Admit: 2021-07-25 | Discharge: 2021-07-25 | Disposition: A | Discharge status: Home / Self Care | Payer: Medicaid Other | Source: Ambulatory Visit | Attending: Obstetrics & Gynecology | Admitting: Obstetrics & Gynecology

## 2021-07-25 ENCOUNTER — Encounter (HOSPITAL_COMMUNITY): Payer: Self-pay | Admitting: Obstetrics & Gynecology

## 2021-07-25 DIAGNOSIS — O0993 Supervision of high risk pregnancy, unspecified, third trimester: Secondary | ICD-10-CM | POA: Insufficient documentation

## 2021-07-25 DIAGNOSIS — D509 Iron deficiency anemia, unspecified: Secondary | ICD-10-CM | POA: Insufficient documentation

## 2021-07-25 DIAGNOSIS — R109 Unspecified abdominal pain: Secondary | ICD-10-CM | POA: Diagnosis not present

## 2021-07-25 DIAGNOSIS — Z3689 Encounter for other specified antenatal screening: Secondary | ICD-10-CM | POA: Diagnosis not present

## 2021-07-25 DIAGNOSIS — O24419 Gestational diabetes mellitus in pregnancy, unspecified control: Secondary | ICD-10-CM | POA: Diagnosis not present

## 2021-07-25 DIAGNOSIS — Z3A34 34 weeks gestation of pregnancy: Secondary | ICD-10-CM | POA: Insufficient documentation

## 2021-07-25 DIAGNOSIS — O99013 Anemia complicating pregnancy, third trimester: Secondary | ICD-10-CM | POA: Diagnosis not present

## 2021-07-25 DIAGNOSIS — O4703 False labor before 37 completed weeks of gestation, third trimester: Secondary | ICD-10-CM | POA: Diagnosis not present

## 2021-07-25 DIAGNOSIS — N858 Other specified noninflammatory disorders of uterus: Secondary | ICD-10-CM | POA: Insufficient documentation

## 2021-07-25 DIAGNOSIS — O26893 Other specified pregnancy related conditions, third trimester: Secondary | ICD-10-CM | POA: Diagnosis not present

## 2021-07-25 DIAGNOSIS — O479 False labor, unspecified: Secondary | ICD-10-CM

## 2021-07-25 DIAGNOSIS — O34219 Maternal care for unspecified type scar from previous cesarean delivery: Secondary | ICD-10-CM | POA: Insufficient documentation

## 2021-07-25 LAB — WET PREP, GENITAL
Clue Cells Wet Prep HPF POC: NONE SEEN
Sperm: NONE SEEN
Trich, Wet Prep: NONE SEEN
WBC, Wet Prep HPF POC: >=10 — AB (ref <10)
Yeast Wet Prep HPF POC: NONE SEEN

## 2021-07-25 LAB — URINALYSIS, ROUTINE W REFLEX MICROSCOPIC
Bilirubin Urine: NEGATIVE
Glucose, UA: NEGATIVE mg/dL
Hgb urine dipstick: NEGATIVE
Ketones, ur: NEGATIVE mg/dL
Leukocytes,Ua: NEGATIVE
Nitrite: NEGATIVE
Protein, ur: NEGATIVE mg/dL
Specific Gravity, Urine: 1.003 — ABNORMAL LOW (ref 1.005–1.030)
pH: 6 (ref 5.0–8.0)

## 2021-07-25 MED ORDER — LACTATED RINGERS IV BOLUS
1000.0000 mL | Freq: Once | INTRAVENOUS | Status: AC
  Administered 2021-07-25: 1000 mL via INTRAVENOUS

## 2021-07-25 MED ORDER — SODIUM CHLORIDE 0.9 % IV SOLN
300.0000 mg | INTRAVENOUS | Status: DC
  Administered 2021-07-25: 300 mg via INTRAVENOUS
  Filled 2021-07-25: qty 300

## 2021-07-25 NOTE — MAU Note (Signed)
Blind swabs collected by CNM

## 2021-07-25 NOTE — MAU Note (Signed)
Lori Baldwin is a 26 y.o. at [redacted]w[redacted]d here in MAU reporting: abdominal cramping that starts at the top of her abdomen that wraps down her abdomen towards her pelvis. States she was just receiving her iron infusion and it started after the infusion. No LOF or bleeding. +FM  Onset of complaint: today  Pain score: 7/10  Vitals:   07/25/21 1241  BP: (!) 100/57  Pulse: 91  Resp: 18  Temp: 98.3 F (36.8 C)  SpO2: 99%     FHT:+FM  Lab orders placed from triage: UA

## 2021-07-25 NOTE — MAU Provider Note (Signed)
Patient Lori Baldwin is a 26 y.o. [phone] at 50w1dhere with complaints of a vaginal tightness and pelvic pressure. She endorses active fetal movements, no vaginal bleeding, no LOF. Patient reports that she is GDMA1 and is checking her sugars at home. She reports that her sugars have been well-controlled. She has a history of C/section and desires TOLAC.  History     CSN: [id number] Arrival date and time: 07/25/21 1217   Event Date/Time   First Provider Initiated Contact with Patient 07/25/21 1307      No chief complaint on file.  Abdominal Pain This is a new problem. The current episode started today. The onset quality is sudden. The problem occurs intermittently. The pain is at a severity of 7/10. Pain radiation: radiates to her pelvis. Pertinent negatives include no constipation, diarrhea, dysuria or nausea.  She started having pain and pressure at her IV infusion this morning; she finished the infusion and then came to MAU for evaluation.   OB History     Gravida  4   Para  1   Term  1   Preterm      AB  2   Living  1      SAB  1   IAB      Ectopic  1   Multiple      Live Births  1        Obstetric Comments  C-section due to FHR changes         Past Medical History:  Diagnosis Date   Anemia    BV (bacterial vaginosis)    Gonorrhea    Headache    Pharyngitis    Yeast vaginitis     Past Surgical History:  Procedure Laterality Date   CESAREAN SECTION      Family History  Problem Relation Age of Onset   Hypertension Mother    Cancer Maternal Grandmother        breast   Diabetes Maternal Grandfather    Sickle cell anemia Paternal Grandmother     Social History   Tobacco Use   Smoking status: Never   Smokeless tobacco: Never  Vaping Use   Vaping Use: Never used  Substance Use Topics   Alcohol use: No   Drug use: Not Currently    Types: Marijuana    Comment: last was early Dec    Allergies:  Allergies  Allergen Reactions    Aspirin Itching    Pt states she does fine when she takes ibuprofen      Medications Prior to Admission  Medication Sig Dispense Refill Last Dose   Prenatal Vit-Fe Fumarate-FA (PREPLUS) 27-1 MG TABS Take 1 tablet by mouth daily. 30 tablet 6 07/25/2021 at 0720   Accu-Chek Softclix Lancets lancets Use four times daily as instructed. 100 each 12    Blood Glucose Monitoring Suppl (ACCU-CHEK GUIDE) w/Device KIT USE AS DIRECTED IN THE MORNING, AT NOON, IN THE EVENING, AND AT BEDTIME (Patient not taking: Reported on 07/17/2021) 1 kit 0    Blood Pressure Monitoring DEVI 1 each by Does not apply route once a week. 1 each 0    glucose blood test strip Use as instructed 100 each 12    Misc. Devices (GOJJI WEIGHT SCALE) MISC 1 Device by Does not apply route as needed. (Patient not taking: Reported on 05/17/2021) 1 each 0    promethazine (PHENERGAN) 25 MG tablet Take 1 tablet (25 mg total) by mouth every 6 (six) hours  as needed for nausea or vomiting. 30 tablet 0     Review of Systems  Constitutional: Negative.   HENT: Negative.    Respiratory: Negative.    Gastrointestinal:  Positive for abdominal pain. Negative for constipation, diarrhea and nausea.  Genitourinary: Negative.  Negative for dysuria.  Neurological: Negative.   Hematological: Negative.   Psychiatric/Behavioral: Negative.    Physical Exam   Blood pressure 98/60, pulse 86, temperature 98.3 F (36.8 C), temperature source Oral, resp. rate 18, weight 59.3 kg, last menstrual period 11/16/2020, SpO2 98 %, unknown if currently breastfeeding.  Physical Exam Constitutional:      Appearance: Normal appearance.  HENT:     Head: Normocephalic.  Musculoskeletal:        General: Normal range of motion.  Skin:    General: Skin is warm.  Neurological:     Mental Status: She is alert.  NEFG: cervix is closed, long and posterior at 1320 MAU Course  Procedures  MDM -NST: 145 bpm, mod var, present acel, no decels, contractions have spaced  out  -will collect wet prep and GC -will start IV and give LR fluid bolus 1445: Cervix is closed, long and posterior on second check 1545: Cervix still long, closed, and posterior Patient has had two bags of IV LR and a snack; she feels well and desires discharge.   Reassessment (3:52 PM) -wet prep shows WBC, no other concerning findings Assessment and Plan   1. Previous cesarean delivery affecting pregnancy, antepartum   2. Supervision of high risk pregnancy in third trimester   3. Maternal iron deficiency anemia complicating pregnancy in third trimester   4. Braxton Hick's contraction   5. [redacted] weeks gestation of pregnancy    -keep appt next week -strict labor precautions reviewed; patient is unchanged over 3.5 hours of monitoring with Cat 1 strip. Reviewed warning signs and when to return to MAU -GC CT pending; all questions answered   Mervyn Skeeters [facility] 07/25/2021, 1:11 PM

## 2021-07-26 LAB — GC/CHLAMYDIA PROBE AMP (~~LOC~~) NOT AT ARMC
Chlamydia: NEGATIVE
Comment: NEGATIVE
Comment: NORMAL
Neisseria Gonorrhea: NEGATIVE

## 2021-08-01 ENCOUNTER — Encounter: Payer: Self-pay | Admitting: Obstetrics and Gynecology

## 2021-08-01 ENCOUNTER — Other Ambulatory Visit: Payer: Self-pay

## 2021-08-01 ENCOUNTER — Ambulatory Visit (INDEPENDENT_AMBULATORY_CARE_PROVIDER_SITE_OTHER): Payer: Medicaid Other | Admitting: Obstetrics and Gynecology

## 2021-08-01 VITALS — BP 101/61 | HR 92 | Wt 133.0 lb

## 2021-08-01 DIAGNOSIS — O0993 Supervision of high risk pregnancy, unspecified, third trimester: Secondary | ICD-10-CM

## 2021-08-01 DIAGNOSIS — D509 Iron deficiency anemia, unspecified: Secondary | ICD-10-CM

## 2021-08-01 DIAGNOSIS — O09899 Supervision of other high risk pregnancies, unspecified trimester: Secondary | ICD-10-CM

## 2021-08-01 DIAGNOSIS — O24410 Gestational diabetes mellitus in pregnancy, diet controlled: Secondary | ICD-10-CM

## 2021-08-01 DIAGNOSIS — O99013 Anemia complicating pregnancy, third trimester: Secondary | ICD-10-CM

## 2021-08-01 DIAGNOSIS — O34219 Maternal care for unspecified type scar from previous cesarean delivery: Secondary | ICD-10-CM

## 2021-08-01 DIAGNOSIS — Z2839 Other underimmunization status: Secondary | ICD-10-CM

## 2021-08-01 NOTE — Patient Instructions (Signed)

## 2021-08-01 NOTE — Progress Notes (Signed)
Subjective:  ?Lori Baldwin is a 26 y.o. [phone] at [redacted]w[redacted]d being seen today for ongoing prenatal care.  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 ? ?Patient reports general discomforts of pregnancy.  Contractions: Irritability. Vag. Bleeding: None.  Movement: Present. Denies leaking of fluid.  ? ?The following portions of the patient's history were reviewed and updated as appropriate: allergies, current medications, past family history, past medical history, past social history, past surgical history and problem list. Problem list updated. ? ?Objective:  ? ?Vitals:  ? 08/01/21 1126  ?BP: 101/61  ?Pulse: 92  ?Weight: 133 lb (60.3 kg)  ? ? ?Fetal Status: Fetal Heart Rate (bpm): 145   Movement: Present    ? ?General:  Alert, oriented and cooperative. Patient is in no acute distress.  ?Skin: Skin is warm and dry. No rash noted.   ?Cardiovascular: Normal heart rate noted  ?Respiratory: Normal respiratory effort, no problems with respiration noted  ?Abdomen: Soft, gravid, appropriate for gestational age. Pain/Pressure: Present     ?Pelvic:  Cervical exam deferred        ?Extremities: Normal range of motion.  Edema: Trace  ?Mental Status: Normal mood and affect. Normal behavior. Normal judgment and thought content.  ? ?Urinalysis:     ? ?Assessment and Plan:  ?Pregnancy: [id number] at [redacted]w[redacted]d ? ?1. Supervision of high risk pregnancy in third trimester ?Stable ?Vaginal cultures next visit ? ?2. Previous cesarean delivery affecting pregnancy, antepartum ?For TOLAC, consented ? ?3. Rubella non-immune status, antepartum ?Vaccine PP ? ?4. Diet  controlled gestational diabetes mellitus (GDM) in third trimester ?Reports CBG's in goal range but did not bring readings. ?Encouraged to bring readings to next visit ?Growth scan ordered ? ?5. Maternal iron deficiency anemia complicating pregnancy in third trimester ?Check CBC at next visit ? ?Preterm labor symptoms and general obstetric precautions including but not limited to vaginal bleeding, contractions, leaking of fluid and fetal movement were reviewed in detail with the patient. ?Please refer to After Visit Summary for other counseling recommendations.  ?Return in about 1 week (around 08/08/2021) for OB visit, face to face, MD only. ? ? ?Lori Milroy, MD ?

## 2021-08-01 NOTE — Progress Notes (Signed)
Pt did not bring paper Glucose log today. Advised to do so at each visit. ?

## 2021-08-07 ENCOUNTER — Other Ambulatory Visit: Payer: Self-pay

## 2021-08-07 ENCOUNTER — Telehealth (INDEPENDENT_AMBULATORY_CARE_PROVIDER_SITE_OTHER): Payer: Medicaid Other | Admitting: Obstetrics & Gynecology

## 2021-08-07 ENCOUNTER — Encounter: Payer: Self-pay | Admitting: Obstetrics & Gynecology

## 2021-08-07 VITALS — BP 102/63 | HR 98

## 2021-08-07 DIAGNOSIS — O0993 Supervision of high risk pregnancy, unspecified, third trimester: Secondary | ICD-10-CM | POA: Diagnosis not present

## 2021-08-07 DIAGNOSIS — O34219 Maternal care for unspecified type scar from previous cesarean delivery: Secondary | ICD-10-CM | POA: Diagnosis not present

## 2021-08-07 DIAGNOSIS — O24410 Gestational diabetes mellitus in pregnancy, diet controlled: Secondary | ICD-10-CM | POA: Diagnosis not present

## 2021-08-07 DIAGNOSIS — D509 Iron deficiency anemia, unspecified: Secondary | ICD-10-CM

## 2021-08-07 DIAGNOSIS — Z3A36 36 weeks gestation of pregnancy: Secondary | ICD-10-CM

## 2021-08-07 DIAGNOSIS — O99013 Anemia complicating pregnancy, third trimester: Secondary | ICD-10-CM

## 2021-08-07 NOTE — Progress Notes (Signed)
I connected with  Lori Baldwin on 08/07/21 at 1002 by telephone and verified that I am speaking with the correct person using two identifiers. ?  ?I discussed the limitations, risks, security and privacy concerns of performing an evaluation and management service by telephone and the availability of in person appointments. I also discussed with the patient that there may be a patient responsible charge related to this service. The patient expressed understanding and agreed to proceed. ? ?Marjo Bicker, RN ?08/07/2021  10:02 AM ? ?

## 2021-08-07 NOTE — Progress Notes (Signed)
? ? ?  TELEHEALTH OBSTETRICS VISIT ENCOUNTER NOTE ? ?Provider location: [facility] [employer] [facility] [facility]  ? ?Patient location: Home ? ?I connected with Lori Baldwin on 08/07/21 at  9:55 AM EDT by telephone at home and verified that I am speaking with the correct person using two identifiers. Of note, unable to do video encounter due to technical difficulties.  ?  ?I discussed the limitations, risks, security and privacy concerns of performing an evaluation and management service by telephone and the availability of in person appointments. I also discussed with the patient that there may be a patient responsible charge related to this service. The patient expressed understanding and agreed to proceed. ? ?Subjective:  ?Lori Baldwin is a 26 y.o. [phone] at [redacted]w[redacted]d being followed for ongoing prenatal care.  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 ? ?Patient reports no complaints. Reports fetal movement. Denies any contractions, bleeding or leaking of fluid.  ? ?The following portions of the patient's history were reviewed and updated as appropriate: allergies, current medications, past family history, past medical history, past social history, past surgical history and problem list.  ? ?Objective:  ?Blood pressure 102/63, pulse 98, last menstrual period 11/16/2020, unknown if currently breastfeeding. ?General:  Alert, oriented and cooperative.   ?Mental Status: Normal mood and affect perceived. Normal judgment and thought content.  ?Rest of physical exam deferred due to type of encounter ? ?Assessment and Plan:   ?Pregnancy: [id number] at [redacted]w[redacted]d ?1. Previous cesarean delivery affecting pregnancy, antepartum ?Plans TOLAC ? ?2. Maternal iron deficiency anemia complicating pregnancy in third trimester ? ? ?3. Supervision of high risk pregnancy in third trimester ?GDM well controlled on diet, some of her first morning BG are not fasting ? ?Preterm labor symptoms and general obstetric precautions including but not limited to vaginal bleeding, contractions, leaking of fluid and fetal movement were reviewed in detail with the patient.  ?I discussed the assessment and treatment plan with the patient. The patient was provided an opportunity to ask questions and all were answered. The patient agreed with the plan and demonstrated an understanding of the instructions. The patient was advised to call back or seek an in-person office evaluation/go to MAU at [facility] for any urgent or concerning symptoms. ?Please refer to After Visit Summary for other counseling recommendations.  ? ?I provided 12 minutes of non-face-to-face time during this encounter. ? ?Return in about 1 week (around 08/14/2021). ? ?Future Appointments  ?Date Time Provider [place]  ?08/08/2021  8:45 AM Menke, Shaunda, PT [facility] None  ?08/11/2021  9:30 AM WMC-MFC NURSE [facility]  ?08/11/2021  9:45 AM WMC-MFC US4 [facility]  ?08/15/2021  3:15 PM Renard Matter, MD [facility]  ? ? ?Emeterio Reeve, MD ?Center for [place], [place] ? ? ? ?

## 2021-08-08 ENCOUNTER — Ambulatory Visit: Payer: Self-pay | Admitting: Rehabilitative and Restorative Service Providers"

## 2021-08-11 ENCOUNTER — Ambulatory Visit: Payer: Medicaid Other | Attending: Obstetrics and Gynecology

## 2021-08-11 ENCOUNTER — Encounter: Payer: Self-pay | Admitting: *Deleted

## 2021-08-11 ENCOUNTER — Ambulatory Visit: Payer: Medicaid Other | Admitting: *Deleted

## 2021-08-11 ENCOUNTER — Other Ambulatory Visit: Payer: Self-pay

## 2021-08-11 VITALS — BP 101/56 | HR 83

## 2021-08-11 DIAGNOSIS — O24419 Gestational diabetes mellitus in pregnancy, unspecified control: Secondary | ICD-10-CM | POA: Insufficient documentation

## 2021-08-11 DIAGNOSIS — O24410 Gestational diabetes mellitus in pregnancy, diet controlled: Secondary | ICD-10-CM

## 2021-08-11 DIAGNOSIS — Z3A36 36 weeks gestation of pregnancy: Secondary | ICD-10-CM

## 2021-08-11 DIAGNOSIS — O34219 Maternal care for unspecified type scar from previous cesarean delivery: Secondary | ICD-10-CM

## 2021-08-11 DIAGNOSIS — D509 Iron deficiency anemia, unspecified: Secondary | ICD-10-CM | POA: Diagnosis present

## 2021-08-11 DIAGNOSIS — D563 Thalassemia minor: Secondary | ICD-10-CM

## 2021-08-11 DIAGNOSIS — O99013 Anemia complicating pregnancy, third trimester: Secondary | ICD-10-CM | POA: Diagnosis present

## 2021-08-11 DIAGNOSIS — O0993 Supervision of high risk pregnancy, unspecified, third trimester: Secondary | ICD-10-CM | POA: Diagnosis present

## 2021-08-11 DIAGNOSIS — O99113 Other diseases of the blood and blood-forming organs and certain disorders involving the immune mechanism complicating pregnancy, third trimester: Secondary | ICD-10-CM | POA: Diagnosis not present

## 2021-08-15 ENCOUNTER — Other Ambulatory Visit: Payer: Self-pay

## 2021-08-15 ENCOUNTER — Other Ambulatory Visit: Payer: Self-pay | Admitting: Family Medicine

## 2021-08-15 ENCOUNTER — Other Ambulatory Visit (HOSPITAL_COMMUNITY)
Admission: RE | Admit: 2021-08-15 | Discharge: 2021-08-15 | Disposition: A | Discharge status: Home / Self Care | Payer: Medicaid Other | Source: Ambulatory Visit | Attending: Family Medicine | Admitting: Family Medicine

## 2021-08-15 ENCOUNTER — Ambulatory Visit (INDEPENDENT_AMBULATORY_CARE_PROVIDER_SITE_OTHER): Payer: Medicaid Other | Admitting: Family Medicine

## 2021-08-15 VITALS — BP 107/58 | HR 95 | Wt 136.0 lb

## 2021-08-15 DIAGNOSIS — Z3A37 37 weeks gestation of pregnancy: Secondary | ICD-10-CM

## 2021-08-15 DIAGNOSIS — Z2839 Other underimmunization status: Secondary | ICD-10-CM

## 2021-08-15 DIAGNOSIS — O0993 Supervision of high risk pregnancy, unspecified, third trimester: Secondary | ICD-10-CM | POA: Insufficient documentation

## 2021-08-15 DIAGNOSIS — O34219 Maternal care for unspecified type scar from previous cesarean delivery: Secondary | ICD-10-CM

## 2021-08-15 DIAGNOSIS — O24410 Gestational diabetes mellitus in pregnancy, diet controlled: Secondary | ICD-10-CM

## 2021-08-15 DIAGNOSIS — O09899 Supervision of other high risk pregnancies, unspecified trimester: Secondary | ICD-10-CM

## 2021-08-15 LAB — OB RESULTS CONSOLE GC/CHLAMYDIA: Gonorrhea: NEGATIVE

## 2021-08-15 NOTE — Progress Notes (Signed)
? ?  Subjective:  ?Lori Baldwin is a 26 y.o. [phone] at 37w1dbeing seen today for ongoing prenatal care.  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 ? ?Patient reports  some cramping but no pain .  Contractions: Irritability. Vag. Bleeding: None.  Movement: Present. Denies leaking of fluid.  ? ?The following portions of the patient's history were reviewed and updated as appropriate: allergies, current medications, past family history, past medical history, past social history, past surgical history and problem list. Problem list updated. ? ?Objective:  ? ?Vitals:  ? 08/15/21 1543  ?BP: (!) 107/58  ?Pulse: 95  ?Weight: 136 lb (61.7 kg)  ? ? ?Fetal Status: Fetal Heart Rate (bpm): 156    Movement: Present    ? ?General:  Alert, oriented and cooperative. Patient is in no acute distress.  ?Skin: Skin is warm and dry. No rash noted.   ?Cardiovascular: Normal heart rate noted  ?Respiratory: Normal respiratory effort, no problems with respiration noted  ?Abdomen: Soft, gravid, appropriate for gestational age. Pain/Pressure: Present     ?Pelvic: Vag. Bleeding: None     ?Cervix closed/thick/high         ?Extremities: Normal range of motion.  Edema: Trace  ?Mental Status: Normal mood and affect. Normal behavior. Normal judgment and thought content.  ? ?Assessment and Plan:  ?Pregnancy: [id number] 361w1d ?1. Supervision of high risk pregnancy in third trimester ?Doing well. Some cramping but no other concerns. NO bleeding. ?- swabs done today ? ?2. Previous cesarean delivery affecting pregnancy, antepartum ?Desires TOLAC. Consent signed on  1/20. Prior CS for FHR indications during IOL after 40 weeks/post dates. ? ?3. Rubella non-immune status, antepartum ?Plan for post partum MMR ? ?4. Diet controlled gestational diabetes mellitus (GDM) in third trimester ?BG log  on patient's iPhone notes app. Reviewed during visit BG log Fasting all below 90, PP bfast and lunch all below 140, Dinner at times 140 -150 but only occasionally ?EFW 48%ile, normal AFI on 3/17 ?- continue diet management of GDM ?- discussed recommendation for IOL at 39 weeks for A1GDM. Discussed IOL can in general take longer for delivery than if she went into labor on own. Patient would like to go ahead and schedule IOL to have a date set. Scheduled for 4/3 and orders placed.  ? ?5. [redacted] weeks gestation of pregnancy ?-follow up in 1 week ? ?Term labor symptoms and general obstetric precautions including but not limited to vaginal bleeding, contractions, leaking of fluid and fetal movement were reviewed in detail with the patient. ?Please refer to After Visit Summary for other counseling recommendations.  ?Return in about 1 week (around 08/22/2021) for HROB. ? ?AnRenard MatterMD, MPH ?OB Fellow, Faculty Practice ? ? ?

## 2021-08-16 ENCOUNTER — Telehealth: Payer: Self-pay | Admitting: Family Medicine

## 2021-08-16 ENCOUNTER — Telehealth (HOSPITAL_COMMUNITY): Payer: Self-pay | Admitting: *Deleted

## 2021-08-16 ENCOUNTER — Encounter (HOSPITAL_COMMUNITY): Payer: Self-pay | Admitting: *Deleted

## 2021-08-16 DIAGNOSIS — O24419 Gestational diabetes mellitus in pregnancy, unspecified control: Secondary | ICD-10-CM

## 2021-08-16 LAB — GC/CHLAMYDIA PROBE AMP (~~LOC~~) NOT AT ARMC
Chlamydia: NEGATIVE
Comment: NEGATIVE
Comment: NORMAL
Neisseria Gonorrhea: NEGATIVE

## 2021-08-16 NOTE — Telephone Encounter (Signed)
Patient needs a nurse to call back regarding their prescription. ?

## 2021-08-16 NOTE — Telephone Encounter (Signed)
Preadmission screen  

## 2021-08-18 ENCOUNTER — Other Ambulatory Visit: Payer: Self-pay | Admitting: Obstetrics and Gynecology

## 2021-08-18 MED ORDER — PROMETHAZINE HCL 6.25 MG/5ML PO SYRP: 12.5000 mg | ORAL_SOLUTION | Freq: Four times a day (QID) | ORAL | 0 refills | Status: DC | PRN

## 2021-08-18 MED ORDER — PROMETHAZINE HCL 25 MG PO TABS: 25.0000 mg | ORAL_TABLET | Freq: Four times a day (QID) | ORAL | 0 refills | Status: DC | PRN

## 2021-08-18 MED ORDER — ACCU-CHEK GUIDE VI STRP: 1.0000 | ORAL_STRIP | Freq: Four times a day (QID) | 6 refills | Status: DC

## 2021-08-18 NOTE — Telephone Encounter (Signed)
Patient states she is unable to refill glucose test strips and needs new prescription. Correct prescription sent.  ? ?Patient also requests Phenergan syrup versus tablets for nausea. States she does not like taking pills and is already taking Tylenol and prenatal vitamin. Offered phenergan suppository, pt declined. Reviewed with Constant, MD who will attempt to send liquid form.  ?

## 2021-08-19 LAB — CULTURE, BETA STREP (GROUP B ONLY): Strep Gp B Culture: NEGATIVE

## 2021-08-22 ENCOUNTER — Encounter: Payer: Self-pay | Admitting: Obstetrics and Gynecology

## 2021-08-22 ENCOUNTER — Other Ambulatory Visit: Payer: Self-pay

## 2021-08-22 ENCOUNTER — Ambulatory Visit (INDEPENDENT_AMBULATORY_CARE_PROVIDER_SITE_OTHER): Payer: Medicaid Other | Admitting: Obstetrics and Gynecology

## 2021-08-22 VITALS — BP 92/63 | HR 90 | Wt 134.5 lb

## 2021-08-22 DIAGNOSIS — D509 Iron deficiency anemia, unspecified: Secondary | ICD-10-CM

## 2021-08-22 DIAGNOSIS — D563 Thalassemia minor: Secondary | ICD-10-CM

## 2021-08-22 DIAGNOSIS — O09899 Supervision of other high risk pregnancies, unspecified trimester: Secondary | ICD-10-CM

## 2021-08-22 DIAGNOSIS — Z2839 Other underimmunization status: Secondary | ICD-10-CM

## 2021-08-22 DIAGNOSIS — O99013 Anemia complicating pregnancy, third trimester: Secondary | ICD-10-CM

## 2021-08-22 DIAGNOSIS — O34219 Maternal care for unspecified type scar from previous cesarean delivery: Secondary | ICD-10-CM

## 2021-08-22 DIAGNOSIS — O0993 Supervision of high risk pregnancy, unspecified, third trimester: Secondary | ICD-10-CM

## 2021-08-22 DIAGNOSIS — O24410 Gestational diabetes mellitus in pregnancy, diet controlled: Secondary | ICD-10-CM

## 2021-08-22 NOTE — Progress Notes (Signed)
? ?  PRENATAL VISIT NOTE ? ?Subjective:  ?Lori Baldwin is a 26 y.o. [phone] at [redacted]w[redacted]d being seen today for ongoing prenatal care.  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 ? ?Patient reports no complaints.  Contractions: Irritability. Vag. Bleeding: None.  Movement: Present. Denies leaking of fluid.  ? ?The following portions of the patient's history were reviewed and updated as appropriate: allergies, current medications, past family history, past medical history, past social history, past surgical history and problem list.  ? ?Objective:  ? ?Vitals:  ? 08/22/21 1136  ?BP: 92/63  ?Pulse: 90  ?Weight: 134 lb 8 oz (61 kg)  ? ? ?Fetal Status: Fetal Heart Rate (bpm): 134 Fundal Height: 37 cm Movement: Present  Presentation: Vertex ? ?General:  Alert, oriented and cooperative. Patient is in no acute distress.  ?Skin: Skin is warm and dry. No rash noted.   ?Cardiovascular: Normal heart rate noted  ?Respiratory: Normal respiratory effort, no problems with respiration noted  ?Abdomen: Soft, gravid, appropriate for gestational age.  Pain/Pressure: Present     ?Pelvic: Cervical exam performed in the presence of a chaperone Dilation: Closed   Station: Ballotable  ?Extremities: Normal range of motion.  Edema: Trace  ?Mental Status: Normal mood and affect. Normal behavior. Normal judgment and thought content.  ? ?Assessment and Plan:  ?Pregnancy: [id number] at [redacted]w[redacted]d ?1. Alpha thalassemia silent carrier ? ?2. Maternal iron deficiency anemia complicating pregnancy in third trimester ?- S/p Iron ? ?3. Rubella non-immune status, antepartum ?- MMR  PP ? ?4. Previous cesarean delivery affecting pregnancy, antepartum ?- Desires TOLAC.  ? ?5. Supervision of high risk pregnancy in third trimester ?- Up to date ? ?6. Diet controlled gestational diabetes mellitus (GDM) in third trimester ?- CBGs reviewed - log on her phone. Her fastings are 80s to low 90s. Her meals are mostly normal but abnormal values are when she does not follow the diet.  ?- Growth on 3/17 was normal - 48%ile.  ?- IOL scheduled for 4/3 but patient is unsure about it. She is nervous. We discussed the option for later IOL at [redacted]w[redacted]d since diet controlled (no later than 4/10 her EDC per our discussion). She may choose any date between 4/3 and 4/10. We discussed the pros and cons of IOL vs labor in s/o TOLAC. Information given on induction and she will let us know if she wishes to change to another day in that date range.  ? ?Term labor symptoms and general obstetric precautions including but not limited to vaginal bleeding, contractions, leaking of fluid and fetal movement were reviewed in detail with the patient. ?Please refer to After Visit Summary for other counseling recommendations.  ? ?Return in about 1 week (around 08/29/2021) for OB VISIT, MD or APP. ? ?Future Appointments  ?Date Time Provider [place]  ?08/28/2021  6:45 AM MC-LD SCHED ROOM [facility]-INDC None  ?08/29/2021 10:55 AM Radene Gunning, MD [facility]  ? ? ?Radene Gunning, MD ? ?

## 2021-08-23 ENCOUNTER — Other Ambulatory Visit: Payer: Self-pay | Admitting: Advanced Practice Midwife

## 2021-08-28 ENCOUNTER — Inpatient Hospital Stay (HOSPITAL_COMMUNITY)
Admission: AD | Admit: 2021-08-28 | Discharge: 2021-09-02 | DRG: 786 | Disposition: A | Discharge status: Home / Self Care | Payer: Medicaid Other | Attending: Obstetrics & Gynecology | Admitting: Obstetrics & Gynecology

## 2021-08-28 ENCOUNTER — Inpatient Hospital Stay (HOSPITAL_COMMUNITY): Payer: Medicaid Other

## 2021-08-28 DIAGNOSIS — O0990 Supervision of high risk pregnancy, unspecified, unspecified trimester: Secondary | ICD-10-CM

## 2021-08-28 DIAGNOSIS — D62 Acute posthemorrhagic anemia: Secondary | ICD-10-CM | POA: Diagnosis not present

## 2021-08-28 DIAGNOSIS — O99284 Endocrine, nutritional and metabolic diseases complicating childbirth: Secondary | ICD-10-CM | POA: Diagnosis present

## 2021-08-28 DIAGNOSIS — Z148 Genetic carrier of other disease: Secondary | ICD-10-CM | POA: Diagnosis not present

## 2021-08-28 DIAGNOSIS — O9081 Anemia of the puerperium: Secondary | ICD-10-CM | POA: Diagnosis not present

## 2021-08-28 DIAGNOSIS — K661 Hemoperitoneum: Secondary | ICD-10-CM | POA: Diagnosis not present

## 2021-08-28 DIAGNOSIS — O24424 Gestational diabetes mellitus in childbirth, insulin controlled: Secondary | ICD-10-CM | POA: Diagnosis not present

## 2021-08-28 DIAGNOSIS — R578 Other shock: Secondary | ICD-10-CM | POA: Diagnosis not present

## 2021-08-28 DIAGNOSIS — D563 Thalassemia minor: Secondary | ICD-10-CM | POA: Diagnosis present

## 2021-08-28 DIAGNOSIS — O24420 Gestational diabetes mellitus in childbirth, diet controlled: Secondary | ICD-10-CM | POA: Diagnosis present

## 2021-08-28 DIAGNOSIS — Z978 Presence of other specified devices: Secondary | ICD-10-CM | POA: Diagnosis not present

## 2021-08-28 DIAGNOSIS — J9600 Acute respiratory failure, unspecified whether with hypoxia or hypercapnia: Secondary | ICD-10-CM | POA: Diagnosis not present

## 2021-08-28 DIAGNOSIS — O0993 Supervision of high risk pregnancy, unspecified, third trimester: Secondary | ICD-10-CM

## 2021-08-28 DIAGNOSIS — E876 Hypokalemia: Secondary | ICD-10-CM | POA: Diagnosis present

## 2021-08-28 DIAGNOSIS — O34211 Maternal care for low transverse scar from previous cesarean delivery: Secondary | ICD-10-CM | POA: Diagnosis present

## 2021-08-28 DIAGNOSIS — Z23 Encounter for immunization: Secondary | ICD-10-CM

## 2021-08-28 DIAGNOSIS — O9953 Diseases of the respiratory system complicating the puerperium: Secondary | ICD-10-CM | POA: Diagnosis present

## 2021-08-28 DIAGNOSIS — Z3A39 39 weeks gestation of pregnancy: Secondary | ICD-10-CM

## 2021-08-28 DIAGNOSIS — Z98891 History of uterine scar from previous surgery: Principal | ICD-10-CM

## 2021-08-28 LAB — CBC
HCT: 34 % — ABNORMAL LOW (ref 36.0–46.0)
Hemoglobin: 11.3 g/dL — ABNORMAL LOW (ref 12.0–15.0)
MCH: 28 pg (ref 26.0–34.0)
MCHC: 33.2 g/dL (ref 30.0–36.0)
MCV: 84.4 fL (ref 80.0–100.0)
Platelets: 127 10*3/uL — ABNORMAL LOW (ref 150–400)
RBC: 4.03 MIL/uL (ref 3.87–5.11)
RDW: 20.2 % — ABNORMAL HIGH (ref 11.5–15.5)
WBC: 7.8 10*3/uL (ref 4.0–10.5)
nRBC: 1.2 % — ABNORMAL HIGH (ref 0.0–0.2)

## 2021-08-28 LAB — GLUCOSE, CAPILLARY
Glucose-Capillary: 77 mg/dL (ref 70–99)
Glucose-Capillary: 92 mg/dL (ref 70–99)
Glucose-Capillary: 57 mg/dL — ABNORMAL LOW (ref 70–99)
Glucose-Capillary: 58 mg/dL — ABNORMAL LOW (ref 70–99)
Glucose-Capillary: 106 mg/dL — ABNORMAL HIGH (ref 70–99)
Glucose-Capillary: 75 mg/dL (ref 70–99)

## 2021-08-28 LAB — RPR: RPR Ser Ql: NONREACTIVE

## 2021-08-28 MED ORDER — PHENYLEPHRINE 40 MCG/ML (10ML) SYRINGE FOR IV PUSH (FOR BLOOD PRESSURE SUPPORT): 80.0000 ug | PREFILLED_SYRINGE | INTRAVENOUS | Status: DC | PRN

## 2021-08-28 MED ORDER — ONDANSETRON HCL 4 MG/2ML IJ SOLN: 4.0000 mg | Freq: Four times a day (QID) | INTRAMUSCULAR | Status: DC | PRN

## 2021-08-28 MED ORDER — OXYTOCIN BOLUS FROM INFUSION: 333.0000 mL | Freq: Once | INTRAVENOUS | Status: DC

## 2021-08-28 MED ORDER — OXYTOCIN-SODIUM CHLORIDE 30-0.9 UT/500ML-% IV SOLN: 2.5000 [IU]/h | INTRAVENOUS | Status: DC

## 2021-08-28 MED ORDER — FENTANYL CITRATE (PF) 100 MCG/2ML IJ SOLN
50.0000 ug | INTRAMUSCULAR | Status: DC | PRN
  Administered 2021-08-28 (×4): 100 ug via INTRAVENOUS
  Administered 2021-08-28: 50 ug via INTRAVENOUS
  Administered 2021-08-29 (×2): 100 ug via INTRAVENOUS
  Filled 2021-08-28 (×9): qty 2

## 2021-08-28 MED ORDER — TERBUTALINE SULFATE 1 MG/ML IJ SOLN: 0.2500 mg | Freq: Once | INTRAMUSCULAR | Status: DC | PRN

## 2021-08-28 MED ORDER — OXYCODONE-ACETAMINOPHEN 5-325 MG PO TABS: 2.0000 | ORAL_TABLET | ORAL | Status: DC | PRN

## 2021-08-28 MED ORDER — LACTATED RINGERS IV SOLN: 500.0000 mL | Freq: Once | INTRAVENOUS | Status: DC

## 2021-08-28 MED ORDER — PHENYLEPHRINE 40 MCG/ML (10ML) SYRINGE FOR IV PUSH (FOR BLOOD PRESSURE SUPPORT)
80.0000 ug | PREFILLED_SYRINGE | INTRAVENOUS | Status: DC | PRN
Start: 2021-08-28 — End: 2021-08-29

## 2021-08-28 MED ORDER — LIDOCAINE HCL (PF) 1 % IJ SOLN: 30.0000 mL | INTRAMUSCULAR | Status: DC | PRN

## 2021-08-28 MED ORDER — DEXTROSE 50 % IV SOLN
25.0000 mL | Freq: Once | INTRAVENOUS | Status: AC
  Administered 2021-08-28: 25 mL via INTRAVENOUS
  Filled 2021-08-28: qty 50

## 2021-08-28 MED ORDER — ACETAMINOPHEN 325 MG PO TABS: 650.0000 mg | ORAL_TABLET | ORAL | Status: DC | PRN

## 2021-08-28 MED ORDER — OXYCODONE-ACETAMINOPHEN 5-325 MG PO TABS: 1.0000 | ORAL_TABLET | ORAL | Status: DC | PRN

## 2021-08-28 MED ORDER — LACTATED RINGERS IV SOLN: INTRAVENOUS | Status: DC

## 2021-08-28 MED ORDER — OXYTOCIN-SODIUM CHLORIDE 30-0.9 UT/500ML-% IV SOLN
1.0000 m[IU]/min | INTRAVENOUS | Status: DC
  Administered 2021-08-28: 2 m[IU]/min via INTRAVENOUS
  Filled 2021-08-28: qty 500

## 2021-08-28 MED ORDER — DIPHENHYDRAMINE HCL 50 MG/ML IJ SOLN: 12.5000 mg | INTRAMUSCULAR | Status: DC | PRN

## 2021-08-28 MED ORDER — FENTANYL-BUPIVACAINE-NACL 0.5-0.125-0.9 MG/250ML-% EP SOLN: 12.0000 mL/h | EPIDURAL | Status: DC | PRN

## 2021-08-28 MED ORDER — EPHEDRINE 5 MG/ML INJ: 10.0000 mg | INTRAVENOUS | Status: DC | PRN

## 2021-08-28 MED ORDER — SOD CITRATE-CITRIC ACID 500-334 MG/5ML PO SOLN: 30.0000 mL | ORAL | Status: DC | PRN

## 2021-08-28 MED ORDER — LACTATED RINGERS IV SOLN: 500.0000 mL | INTRAVENOUS | Status: DC | PRN

## 2021-08-28 NOTE — Progress Notes (Signed)
Labor Progress Note [name] is a 26 y.o. [phone] at 64w0dpresented for IOL due to A1GDM ?S: Patient is resting comfortably and reported strong contractions that are more frequent than earlier after pitocin ? ?O:  ?BP 110/76   Pulse 70   Temp 98.7 ?F (37.1 ?C) (Oral)   Resp 18   Ht 5' (1.524 m)   Wt 63 kg   LMP 11/16/2020 (Within Days)   BMI 27.15 kg/m?  ?EFM: 135+/Moderate variability/ +Accels, no decels ?TOCO: q2 to q3 minutes contractions ? ?CVE: Dilation: Closed ?Presentation: Vertex ?Exam by:: F Cresenzo Dishmon CNM ? ? ?A&P: 26y.o. [id number] for IOL due to A1GDM ?#Labor: TOLAC (consent signed & in chart 06/16/21), Attempted Cooks catheter, unsuccesful. SVE closed thick and soft. Will increase pitocin and recheck in 4 hours. ?#Pain:  IV meds, epidural when desires ?#FWB: Cat I overall reassuring ?#ID:      GBS negative ?#MOF: pumped breastmilk ?#MOC: unsure, options discussed in depth ?#Circ:   Desires ?  ?#A1GM- Recent growth sono on 08/11/21 EFW 48%ile, AC 61%ile. CBG q4 hr in latent labor, q2 hour in active labor. Last CBG at 1300 was 92.  ?  ?#Rubella NI - MMR postpartum. ?#IDA- hgb 11.3 on admission. ? ? ?JoAlen BleacherMD ?[facility] ?4:53 PM  ?

## 2021-08-28 NOTE — H&P (Signed)
OBSTETRIC ADMISSION HISTORY AND PHYSICAL ? ?Lori Baldwin is a 26 y.o. female [phone] with IUP at 11w0dby 6wk sono presenting for IOL for A1GDM. She reports +FMs, No LOF, no VB, no blurry vision, headaches or peripheral edema, and RUQ pain.  She plans on pumping breastmilk and bottle feeding. She requests condoms/unsure for birth control. ?[facility] ? ?Dating: By 6Arlan Organ--->  Estimated Date of Delivery: 09/04/21 ? ?Sono:   ? ?@[redacted]w[redacted]d , CWD, normal anatomy, cephalic presentation,  26629U 48% EFW ? ? ?Prenatal History/Complications:  ?AT6LYY?Previous Cesarean delivery- for fetal intolerance to labor ?Iron def anemia- s/p IV iron ?SMA carrier ?Rubella NI ?Alpha thalassemia silent carrier ? ? ?Past Medical History: ?Past Medical History:  ?Diagnosis Date  ? Anemia   ? BV (bacterial vaginosis)   ? Gestational diabetes   ? Gonorrhea   ? Headache   ? Pharyngitis   ? Yeast vaginitis   ? ? ?Past Surgical History: ?Past Surgical History:  ?Procedure Laterality Date  ? CESAREAN SECTION    ? ? ?Obstetrical History: ?OB History   ? ? Gravida  ?4  ? Para  ?1  ? Term  ?1  ? Preterm  ?   ? AB  ?2  ? Living  ?1  ?  ? ? SAB  ?1  ? IAB  ?   ? Ectopic  ?1  ? Multiple  ?   ? Live Births  ?1  ?   ?  ? Obstetric Comments  ?C-section due to FHR changes  ?  ? ?  ? ? ?Social History ?Social History  ? ?Socioeconomic History  ? Marital status: Single  ?  Spouse name: Not on file  ? Number of children: Not on file  ? Years of education: Not on file  ? Highest education level: Not on file  ?Occupational History  ? Occupation: waitress  ?Tobacco Use  ? Smoking status: Never  ? Smokeless tobacco: Never  ?Vaping Use  ? Vaping Use: Never used  ?Substance and Sexual Activity  ? Alcohol use: No  ? Drug use: Not Currently  ?  Types: Marijuana  ?  Comment: last was early Dec  ? Sexual activity: Yes  ?  Birth control/protection: None  ?Other Topics Concern  ? Not on file  ?Social History Narrative  ? Not on file  ? ?Social  Determinants of Health  ? ?Financial Resource Strain: Not on file  ?Food Insecurity: No Food Insecurity  ? Worried About RCharity fundraiserin the Last Year: Never true  ? Ran Out of Food in the Last Year: Never true  ?Transportation Needs: No Transportation Needs  ? Lack of Transportation (Medical): No  ? Lack of Transportation (Non-Medical): No  ?Physical Activity: Not on file  ?Stress: Not on file  ?Social Connections: Not on file  ? ? ?Family History: ?Family History  ?Problem Relation Age of Onset  ? Hypertension Mother   ? Cancer Maternal Grandmother   ?     breast  ? Diabetes Maternal Grandfather   ? Sickle cell anemia Paternal Grandmother   ? ? ?Allergies: ?Allergies  ?Allergen Reactions  ? Aspirin Itching  ?  Pt states she does fine when she takes ibuprofen  ?  ? ? ?Pt denies allergies to latex, iodine, or shellfish. ? ?Medications Prior to Admission  ?Medication Sig Dispense Refill Last Dose  ? Accu-Chek Softclix Lancets lancets Use four times daily as instructed.  100 each 12   ? Blood Glucose Monitoring Suppl (ACCU-CHEK GUIDE) w/Device KIT USE AS DIRECTED IN THE MORNING, AT NOON, IN THE EVENING, AND AT BEDTIME 1 kit 0   ? Blood Pressure Monitoring DEVI 1 each by Does not apply route once a week. 1 each 0   ? glucose blood (ACCU-CHEK GUIDE) test strip 1 each by Other route 4 (four) times daily. Use as instructed; check blood glucose 4 times daily 100 each 6   ? Misc. Devices (GOJJI WEIGHT SCALE) MISC 1 Device by Does not apply route as needed. 1 each 0   ? Prenatal Vit-Fe Fumarate-FA (PREPLUS) 27-1 MG TABS Take 1 tablet by mouth daily. 30 tablet 6   ? promethazine (PHENERGAN) 25 MG tablet Take 1 tablet (25 mg total) by mouth every 6 (six) hours as needed for nausea or vomiting. 30 tablet 0   ? promethazine (PHENERGAN) 6.25 MG/5ML syrup Take 10 mLs (12.5 mg total) by mouth every 6 (six) hours as needed for nausea or vomiting. 120 mL 0   ? ? ? ?Review of Systems  ? ?All systems reviewed and negative except  as stated in HPI ? ?Blood pressure 100/64, pulse 77, temperature 98.4 ?F (36.9 ?C), temperature source Oral, resp. rate 20, height 5' (1.524 m), weight 63 kg, last menstrual period 11/16/2020, unknown if currently breastfeeding. ?General appearance: alert, cooperative, and appears stated age ?Lungs: normal work of breathing ?Abdomen: soft, non-tender; gravid ?Pelvic: SVE 0/thick/-3 soft ?Extremities: Homans sign is negative, no sign of DVT ?Presentation: cephalic by bedside ultrasound ?Fetal monitoringBaseline: 135 bpm, Variability: Good {> 6 bpm), Accelerations: Reactive, and Decelerations: Absent ?Uterine activityNone ?Dilation: Closed ?Exam by:: dr Rhylin Venters ? ? ?Prenatal labs: ?ABO, Rh: --/--/A POS (04/03 1610) ?Antibody: NEG (04/03 9604) ?Rubella: <0.90 (09/28 1556) ?RPR: Non Reactive (01/20 0903)  ?HBsAg: Negative (09/28 1556)  ?HIV: Non Reactive (01/20 0903)  ?GBS: Negative/-- (03/21 1635)  ?2 hr Glucola: fasting 90, 1 hr 198, 2 hr 136- failed ?Genetic screening:  AFP negative, LR Panorama NIPS, Horizon alpha thal carrier and SMA increased risk ?Anatomy US: normal ? ?Prenatal Transfer Tool  ?Maternal Diabetes: Yes:  Diabetes Type:  Diet controlled ?Genetic Screening: Abnormal:  Results: Other: increased risk SMA carrier, alpha thalassemia silent carrier ?Maternal Ultrasounds/Referrals: Normal ?Fetal Ultrasounds or other Referrals:  None ?Maternal Substance Abuse:  No ?Significant Maternal Medications:  None ?Significant Maternal Lab Results: Group B Strep negative ? ?Results for orders placed or performed during the hospital encounter of 08/28/21 (from the past 24 hour(s))  ?Glucose, capillary  ? Collection Time: 08/28/21  8:34 AM  ?Result Value Ref Range  ? Glucose-Capillary 77 70 - 99 mg/dL  ?CBC  ? Collection Time: 08/28/21  8:36 AM  ?Result Value Ref Range  ? WBC PENDING 4.0 - 10.5 K/uL  ? RBC 4.03 3.87 - 5.11 MIL/uL  ? Hemoglobin 11.3 (L) 12.0 - 15.0 g/dL  ? HCT 34.0 (L) 36.0 - 46.0 %  ? MCV 84.4 80.0 - 100.0  fL  ? MCH 28.0 26.0 - 34.0 pg  ? MCHC 33.2 30.0 - 36.0 g/dL  ? RDW 20.2 (H) 11.5 - 15.5 %  ? Platelets PENDING 150 - 400 K/uL  ? nRBC PENDING 0.0 - 0.2 %  ?Type and screen  ? Collection Time: 08/28/21  8:36 AM  ?Result Value Ref Range  ? ABO/RH(D) A POS   ? Antibody Screen NEG   ? Sample Expiration    ?  08/31/2021,2359 ?Performed at [facility], [place] [address] [place], [place] [address] ?  ? ? ?Patient Active Problem List  ? Diagnosis Date Noted  ? Encounter for supervision of high risk pregnancy in third trimester, antepartum 08/28/2021  ? Gestational diabetes mellitus (GDM) in third trimester 06/19/2021  ? Maternal iron deficiency anemia complicating pregnancy in third trimester 06/19/2021  ? Carrier of SMA 03/16/2021  ? Alpha thalassemia silent carrier 03/13/2021  ? Rubella non-immune status, antepartum 02/23/2021  ? Previous cesarean delivery affecting pregnancy, antepartum 02/22/2021  ? Supervision of high-risk pregnancy 02/01/2021  ? ? ?Assessment/Plan:  ?Lori Baldwin is a 26 y.o. [id number] at 25w0dhere for IOL for A1DM.  ? ?#Labor: TOLAC (consent signed & in chart 06/16/21), Attempted Cooks catheter, unsuccesful. SVE closed thick and soft. Will start pitocin and recheck in 4 hours, re-attempt FB potentially at next check. ?#Pain: IV meds, epidural when desires ?#FWB: Cat I overall reassuring ?#ID:  GBS negative ?#MOF: pumped breastmilk ?#MOC: unsure, options discussed in depth ?#Circ:  Desires ? ?#A1GM- Recent growth sono on 08/11/21 EFW 48%ile, AC 61%ile. CBG q4 hr in latent labor, q2 hour in active labor. ? ?#Rubella NI - MMR postpartum. ?#IDA- hgb 11.3 on admission. ? ?MLenoria Chime MD  ?[facility]?08/28/2021, 10:25 AM ? ? ? ?

## 2021-08-28 NOTE — Progress Notes (Signed)
Patient blood sugars decreased.  Notified Dr. Miquel Dunn of Blood Sugar at 57.  Orders received for 1/2 Amp of Glucose Push.  Will repeat in 15 minutes post Med.  Patient is Non Symptomatic, Alert Oriented x 4.  EFM reactive at 130 with Accels.  ?

## 2021-08-29 ENCOUNTER — Inpatient Hospital Stay (HOSPITAL_COMMUNITY): Payer: Medicaid Other | Admitting: Anesthesiology

## 2021-08-29 ENCOUNTER — Encounter (HOSPITAL_COMMUNITY): Payer: Self-pay | Admitting: Family Medicine

## 2021-08-29 ENCOUNTER — Encounter: Payer: Medicaid Other | Admitting: Obstetrics and Gynecology

## 2021-08-29 ENCOUNTER — Inpatient Hospital Stay (HOSPITAL_COMMUNITY): Payer: Medicaid Other

## 2021-08-29 ENCOUNTER — Encounter (HOSPITAL_COMMUNITY)
Admission: AD | Disposition: A | Discharge status: Home / Self Care | Payer: Self-pay | Source: Home / Self Care | Attending: Family Medicine

## 2021-08-29 DIAGNOSIS — Z3A39 39 weeks gestation of pregnancy: Secondary | ICD-10-CM

## 2021-08-29 DIAGNOSIS — O34211 Maternal care for low transverse scar from previous cesarean delivery: Secondary | ICD-10-CM

## 2021-08-29 DIAGNOSIS — D62 Acute posthemorrhagic anemia: Secondary | ICD-10-CM | POA: Diagnosis not present

## 2021-08-29 DIAGNOSIS — O24424 Gestational diabetes mellitus in childbirth, insulin controlled: Secondary | ICD-10-CM

## 2021-08-29 DIAGNOSIS — O0993 Supervision of high risk pregnancy, unspecified, third trimester: Secondary | ICD-10-CM | POA: Diagnosis not present

## 2021-08-29 DIAGNOSIS — R578 Other shock: Secondary | ICD-10-CM | POA: Diagnosis not present

## 2021-08-29 DIAGNOSIS — Z978 Presence of other specified devices: Secondary | ICD-10-CM | POA: Diagnosis not present

## 2021-08-29 DIAGNOSIS — Z98891 History of uterine scar from previous surgery: Secondary | ICD-10-CM

## 2021-08-29 HISTORY — PX: ABDOMINAL HYSTERECTOMY: SHX81

## 2021-08-29 HISTORY — PX: LAPAROTOMY: SHX154

## 2021-08-29 LAB — COMPREHENSIVE METABOLIC PANEL
ALT: 10 U/L (ref 0–44)
ALT: 14 U/L (ref 0–44)
AST: 19 U/L (ref 15–41)
AST: 22 U/L (ref 15–41)
Albumin: 1.7 g/dL — ABNORMAL LOW (ref 3.5–5.0)
Albumin: 1.8 g/dL — ABNORMAL LOW (ref 3.5–5.0)
Alkaline Phosphatase: 106 U/L (ref 38–126)
Alkaline Phosphatase: 75 U/L (ref 38–126)
Anion gap: 10 (ref 5–15)
Anion gap: 6 (ref 5–15)
BUN: 8 mg/dL (ref 6–20)
BUN: 8 mg/dL (ref 6–20)
CO2: 20 mmol/L — ABNORMAL LOW (ref 22–32)
CO2: 20 mmol/L — ABNORMAL LOW (ref 22–32)
Calcium: 7.2 mg/dL — ABNORMAL LOW (ref 8.9–10.3)
Calcium: 7.7 mg/dL — ABNORMAL LOW (ref 8.9–10.3)
Chloride: 106 mmol/L (ref 98–111)
Chloride: 109 mmol/L (ref 98–111)
Creatinine, Ser: 0.76 mg/dL (ref 0.44–1.00)
Creatinine, Ser: 0.62 mg/dL (ref 0.44–1.00)
GFR, Estimated: >60 mL/min (ref >60)
GFR, Estimated: >60 mL/min (ref >60)
Glucose, Bld: 198 mg/dL — ABNORMAL HIGH (ref 70–99)
Glucose, Bld: 132 mg/dL — ABNORMAL HIGH (ref 70–99)
Potassium: 4.6 mmol/L (ref 3.5–5.1)
Potassium: 4 mmol/L (ref 3.5–5.1)
Sodium: 136 mmol/L (ref 135–145)
Sodium: 135 mmol/L (ref 135–145)
Total Bilirubin: 0.5 mg/dL (ref 0.3–1.2)
Total Bilirubin: 0.6 mg/dL (ref 0.3–1.2)
Total Protein: <3 g/dL — ABNORMAL LOW (ref 6.5–8.1)
Total Protein: 3.3 g/dL — ABNORMAL LOW (ref 6.5–8.1)

## 2021-08-29 LAB — GLUCOSE, CAPILLARY
Glucose-Capillary: 98 mg/dL (ref 70–99)
Glucose-Capillary: 108 mg/dL — ABNORMAL HIGH (ref 70–99)

## 2021-08-29 LAB — CBC
HCT: 17.4 % — ABNORMAL LOW (ref 36.0–46.0)
Hemoglobin: 5.9 g/dL — CL (ref 12.0–15.0)
MCH: 29.5 pg (ref 26.0–34.0)
MCHC: 33.9 g/dL (ref 30.0–36.0)
MCV: 87 fL (ref 80.0–100.0)
Platelets: 28 10*3/uL — CL (ref 150–400)
RBC: 2 MIL/uL — ABNORMAL LOW (ref 3.87–5.11)
RDW: 15.9 % — ABNORMAL HIGH (ref 11.5–15.5)
WBC: 11.8 10*3/uL — ABNORMAL HIGH (ref 4.0–10.5)
nRBC: 0.3 % — ABNORMAL HIGH (ref 0.0–0.2)

## 2021-08-29 LAB — POCT I-STAT 7, (LYTES, BLD GAS, ICA,H+H)
Acid-base deficit: 3 mmol/L — ABNORMAL HIGH (ref 0.0–2.0)
Bicarbonate: 20.3 mmol/L (ref 20.0–28.0)
Calcium, Ion: 1.08 mmol/L — ABNORMAL LOW (ref 1.15–1.40)
HCT: 25 % — ABNORMAL LOW (ref 36.0–46.0)
Hemoglobin: 8.5 g/dL — ABNORMAL LOW (ref 12.0–15.0)
O2 Saturation: 100 %
Patient temperature: 97.4
Potassium: 4.1 mmol/L (ref 3.5–5.1)
Sodium: 137 mmol/L (ref 135–145)
TCO2: 21 mmol/L — ABNORMAL LOW (ref 22–32)
pCO2 arterial: 26.7 mmHg — ABNORMAL LOW (ref 32–48)
pH, Arterial: 7.487 — ABNORMAL HIGH (ref 7.35–7.45)
pO2, Arterial: 239 mmHg — ABNORMAL HIGH (ref 83–108)

## 2021-08-29 LAB — MAGNESIUM: Magnesium: 1.1 mg/dL — ABNORMAL LOW (ref 1.7–2.4)

## 2021-08-29 LAB — DIC (DISSEMINATED INTRAVASCULAR COAGULATION)PANEL
D-Dimer, Quant: 3.62 ug/mL-FEU — ABNORMAL HIGH (ref 0.00–0.50)
Fibrinogen: 195 mg/dL — ABNORMAL LOW (ref 210–475)
INR: 1.5 — ABNORMAL HIGH (ref 0.8–1.2)
Platelets: 28 10*3/uL — CL (ref 150–400)
Prothrombin Time: 18.3 seconds — ABNORMAL HIGH (ref 11.4–15.2)
Smear Review: NONE SEEN
aPTT: 39 seconds — ABNORMAL HIGH (ref 24–36)

## 2021-08-29 LAB — PREPARE RBC (CROSSMATCH)

## 2021-08-29 LAB — MASSIVE TRANSFUSION PROTOCOL ORDER (BLOOD BANK NOTIFICATION)

## 2021-08-29 LAB — MRSA NEXT GEN BY PCR, NASAL: MRSA by PCR Next Gen: NOT DETECTED

## 2021-08-29 LAB — GLOBAL TEG PANEL
CFF Max Amplitude: 9.4 mm — ABNORMAL LOW (ref 15–32)
CK with Heparinase (R): 5.4 min (ref 4.3–8.3)
Citrated Functional Fibrinogen: 171.5 mg/dL — ABNORMAL LOW (ref 278–581)
Citrated Kaolin (K): 2.8 min — ABNORMAL HIGH (ref 0.8–2.1)
Citrated Kaolin (MA): 43.2 mm — ABNORMAL LOW (ref 52–69)
Citrated Kaolin (R): 6.1 min (ref 4.6–9.1)
Citrated Kaolin Angle: 62.6 deg — ABNORMAL LOW (ref 63–78)
Citrated Rapid TEG (MA): 41 mm — ABNORMAL LOW (ref 52–70)

## 2021-08-29 LAB — LACTIC ACID, PLASMA: Lactic Acid, Venous: 3.1 mmol/L (ref 0.5–1.9)

## 2021-08-29 LAB — FIBRINOGEN: Fibrinogen: 271 mg/dL (ref 210–475)

## 2021-08-29 LAB — PHOSPHORUS: Phosphorus: 3.9 mg/dL (ref 2.5–4.6)

## 2021-08-29 SURGERY — Surgical Case
Anesthesia: Epidural

## 2021-08-29 MED ORDER — SODIUM CHLORIDE 0.9% IV SOLUTION: Freq: Once | INTRAVENOUS | Status: DC

## 2021-08-29 MED ORDER — TERBUTALINE SULFATE 1 MG/ML IJ SOLN
INTRAMUSCULAR | Status: AC
  Administered 2021-08-29: 1 mg
  Filled 2021-08-29: qty 1

## 2021-08-29 MED ORDER — FENTANYL CITRATE (PF) 100 MCG/2ML IJ SOLN
50.0000 ug | INTRAMUSCULAR | Status: DC | PRN
  Administered 2021-08-30 (×2): 50 ug via INTRAVENOUS
  Filled 2021-08-29 (×2): qty 2

## 2021-08-29 MED ORDER — CEFAZOLIN SODIUM-DEXTROSE 2-4 GM/100ML-% IV SOLN
2.0000 g | Freq: Once | INTRAVENOUS | Status: AC
  Administered 2021-08-29 (×2): 2 g via INTRAVENOUS

## 2021-08-29 MED ORDER — METHYLERGONOVINE MALEATE 0.2 MG/ML IJ SOLN
INTRAMUSCULAR | Status: AC
  Filled 2021-08-29: qty 1

## 2021-08-29 MED ORDER — SODIUM CHLORIDE 0.9 % IV SOLN
INTRAVENOUS | Status: AC
  Filled 2021-08-29: qty 5

## 2021-08-29 MED ORDER — SUCCINYLCHOLINE CHLORIDE 200 MG/10ML IV SOSY
PREFILLED_SYRINGE | INTRAVENOUS | Status: AC
  Filled 2021-08-29: qty 10

## 2021-08-29 MED ORDER — ROCURONIUM BROMIDE 10 MG/ML (PF) SYRINGE
PREFILLED_SYRINGE | INTRAVENOUS | Status: AC
  Filled 2021-08-29: qty 10

## 2021-08-29 MED ORDER — PHENYLEPHRINE HCL-NACL 20-0.9 MG/250ML-% IV SOLN
INTRAVENOUS | Status: AC
  Filled 2021-08-29: qty 250

## 2021-08-29 MED ORDER — PANTOPRAZOLE SODIUM 40 MG IV SOLR
40.0000 mg | Freq: Every day | INTRAVENOUS | Status: DC
  Administered 2021-08-29: 40 mg via INTRAVENOUS
  Filled 2021-08-29: qty 10

## 2021-08-29 MED ORDER — POLYETHYLENE GLYCOL 3350 17 G PO PACK: 17.0000 g | PACK | Freq: Every day | ORAL | Status: DC | PRN

## 2021-08-29 MED ORDER — PROPOFOL 1000 MG/100ML IV EMUL
0.0000 ug/kg/min | INTRAVENOUS | Status: DC
  Administered 2021-08-29: 30 ug/kg/min via INTRAVENOUS
  Administered 2021-08-30: 40 ug/kg/min via INTRAVENOUS
  Filled 2021-08-29: qty 100

## 2021-08-29 MED ORDER — ALBUMIN HUMAN 5 % IV SOLN: INTRAVENOUS | Status: DC | PRN

## 2021-08-29 MED ORDER — IBUPROFEN 200 MG PO TABS: 600.0000 mg | ORAL_TABLET | Freq: Four times a day (QID) | ORAL | Status: DC

## 2021-08-29 MED ORDER — ORAL CARE MOUTH RINSE
15.0000 mL | OROMUCOSAL | Status: DC
  Administered 2021-08-29 – 2021-08-30 (×5): 15 mL via OROMUCOSAL

## 2021-08-29 MED ORDER — LACTATED RINGERS AMNIOINFUSION: INTRAVENOUS | Status: DC

## 2021-08-29 MED ORDER — PROPOFOL 1000 MG/100ML IV EMUL
INTRAVENOUS | Status: AC
  Filled 2021-08-29: qty 100

## 2021-08-29 MED ORDER — EPHEDRINE 5 MG/ML INJ: 10.0000 mg | INTRAVENOUS | Status: DC | PRN

## 2021-08-29 MED ORDER — POLYETHYLENE GLYCOL 3350 17 G PO PACK
17.0000 g | PACK | Freq: Every day | ORAL | Status: DC
  Administered 2021-08-29 – 2021-08-31 (×3): 17 g
  Filled 2021-08-29 (×3): qty 1

## 2021-08-29 MED ORDER — CALCIUM GLUCONATE-NACL 1-0.675 GM/50ML-% IV SOLN
INTRAVENOUS | Status: AC
  Filled 2021-08-29: qty 50

## 2021-08-29 MED ORDER — MORPHINE SULFATE (PF) 0.5 MG/ML IJ SOLN
INTRAMUSCULAR | Status: AC
  Filled 2021-08-29: qty 10

## 2021-08-29 MED ORDER — PHENYLEPHRINE 40 MCG/ML (10ML) SYRINGE FOR IV PUSH (FOR BLOOD PRESSURE SUPPORT)
PREFILLED_SYRINGE | INTRAVENOUS | Status: AC
  Filled 2021-08-29: qty 10

## 2021-08-29 MED ORDER — SODIUM CHLORIDE 0.9 % IV SOLN: 250.0000 mL | INTRAVENOUS | Status: DC

## 2021-08-29 MED ORDER — OXYTOCIN-SODIUM CHLORIDE 30-0.9 UT/500ML-% IV SOLN: INTRAVENOUS | Status: DC | PRN

## 2021-08-29 MED ORDER — MIDAZOLAM HCL 2 MG/2ML IJ SOLN
INTRAMUSCULAR | Status: DC | PRN
  Administered 2021-08-29 (×2): 2 mg via INTRAVENOUS

## 2021-08-29 MED ORDER — ENOXAPARIN SODIUM 40 MG/0.4ML IJ SOSY: 40.0000 mg | PREFILLED_SYRINGE | INTRAMUSCULAR | Status: DC

## 2021-08-29 MED ORDER — CALCIUM GLUCONATE 10 % IV SOLN
INTRAVENOUS | Status: DC | PRN
  Administered 2021-08-29 (×2): 1 g via INTRAVENOUS

## 2021-08-29 MED ORDER — OXYCODONE HCL 5 MG PO TABS: 5.0000 mg | ORAL_TABLET | Freq: Four times a day (QID) | ORAL | Status: DC | PRN

## 2021-08-29 MED ORDER — LACTATED RINGERS IV SOLN: INTRAVENOUS | Status: DC

## 2021-08-29 MED ORDER — SIMETHICONE 80 MG PO CHEW: 80.0000 mg | CHEWABLE_TABLET | ORAL | Status: DC | PRN

## 2021-08-29 MED ORDER — MIDAZOLAM HCL 2 MG/2ML IJ SOLN
INTRAMUSCULAR | Status: AC
  Filled 2021-08-29: qty 2

## 2021-08-29 MED ORDER — DIPHENHYDRAMINE HCL 25 MG PO CAPS
25.0000 mg | ORAL_CAPSULE | ORAL | Status: DC | PRN
Start: 2021-08-29 — End: 2021-09-02

## 2021-08-29 MED ORDER — OXYTOCIN-SODIUM CHLORIDE 30-0.9 UT/500ML-% IV SOLN
INTRAVENOUS | Status: DC | PRN
  Administered 2021-08-29: 500 mL via INTRAVENOUS

## 2021-08-29 MED ORDER — MIDAZOLAM HCL 2 MG/2ML IJ SOLN: 2.0000 mg | INTRAMUSCULAR | Status: DC | PRN

## 2021-08-29 MED ORDER — CEFAZOLIN SODIUM-DEXTROSE 2-4 GM/100ML-% IV SOLN
INTRAVENOUS | Status: AC
  Filled 2021-08-29: qty 100

## 2021-08-29 MED ORDER — CARBOPROST TROMETHAMINE 250 MCG/ML IM SOLN
INTRAMUSCULAR | Status: AC
  Filled 2021-08-29: qty 1

## 2021-08-29 MED ORDER — DIPHENHYDRAMINE HCL 25 MG PO CAPS: 25.0000 mg | ORAL_CAPSULE | Freq: Four times a day (QID) | ORAL | Status: DC | PRN

## 2021-08-29 MED ORDER — SENNOSIDES-DOCUSATE SODIUM 8.6-50 MG PO TABS: 2.0000 | ORAL_TABLET | Freq: Every day | ORAL | Status: DC

## 2021-08-29 MED ORDER — LIDOCAINE-EPINEPHRINE (PF) 2 %-1:200000 IJ SOLN
INTRAMUSCULAR | Status: AC
  Filled 2021-08-29: qty 20

## 2021-08-29 MED ORDER — PROPOFOL 500 MG/50ML IV EMUL
INTRAVENOUS | Status: DC | PRN
  Administered 2021-08-29: 100 ug/kg/min via INTRAVENOUS

## 2021-08-29 MED ORDER — ZOLPIDEM TARTRATE 5 MG PO TABS: 5.0000 mg | ORAL_TABLET | Freq: Every evening | ORAL | Status: DC | PRN

## 2021-08-29 MED ORDER — PROPOFOL 500 MG/50ML IV EMUL
INTRAVENOUS | Status: AC
Start: 2021-08-29 — End: ?
  Filled 2021-08-29: qty 50

## 2021-08-29 MED ORDER — OXYTOCIN-SODIUM CHLORIDE 30-0.9 UT/500ML-% IV SOLN
INTRAVENOUS | Status: AC
  Filled 2021-08-29: qty 500

## 2021-08-29 MED ORDER — SODIUM CHLORIDE 0.9 % IV SOLN: INTRAVENOUS | Status: DC | PRN

## 2021-08-29 MED ORDER — ALBUMIN HUMAN 5 % IV SOLN
INTRAVENOUS | Status: AC
  Filled 2021-08-29: qty 250

## 2021-08-29 MED ORDER — LACTATED RINGERS IV SOLN: 500.0000 mL | Freq: Once | INTRAVENOUS | Status: DC

## 2021-08-29 MED ORDER — NALOXONE HCL 0.4 MG/ML IJ SOLN: 0.4000 mg | INTRAMUSCULAR | Status: DC | PRN

## 2021-08-29 MED ORDER — SODIUM CHLORIDE 0.9% IV SOLUTION: Freq: Once | INTRAVENOUS | Status: AC

## 2021-08-29 MED ORDER — DOCUSATE SODIUM 50 MG/5ML PO LIQD
100.0000 mg | Freq: Two times a day (BID) | ORAL | Status: DC
  Administered 2021-08-29 – 2021-09-01 (×4): 100 mg
  Filled 2021-08-29 (×7): qty 10

## 2021-08-29 MED ORDER — TERBUTALINE SULFATE 1 MG/ML IJ SOLN
0.2500 mg | Freq: Once | INTRAMUSCULAR | Status: AC
  Administered 2021-08-29: 0.25 mg via SUBCUTANEOUS

## 2021-08-29 MED ORDER — SOD CITRATE-CITRIC ACID 500-334 MG/5ML PO SOLN
ORAL | Status: AC
  Filled 2021-08-29: qty 30

## 2021-08-29 MED ORDER — LACTATED RINGERS IV SOLN: INTRAVENOUS | Status: DC | PRN

## 2021-08-29 MED ORDER — SODIUM CHLORIDE 0.9 % IR SOLN
Status: DC | PRN
Start: 2021-08-29 — End: 2021-08-29
  Administered 2021-08-29: 1

## 2021-08-29 MED ORDER — METHYLERGONOVINE MALEATE 0.2 MG/ML IJ SOLN
INTRAMUSCULAR | Status: DC | PRN
  Administered 2021-08-29 (×3): .2 mg via INTRAMUSCULAR

## 2021-08-29 MED ORDER — DIPHENHYDRAMINE HCL 50 MG/ML IJ SOLN: 12.5000 mg | INTRAMUSCULAR | Status: DC | PRN

## 2021-08-29 MED ORDER — FENTANYL-BUPIVACAINE-NACL 0.5-0.125-0.9 MG/250ML-% EP SOLN: 12.0000 mL/h | EPIDURAL | Status: DC | PRN

## 2021-08-29 MED ORDER — ACETAMINOPHEN 500 MG PO TABS: 1000.0000 mg | ORAL_TABLET | Freq: Four times a day (QID) | ORAL | Status: DC

## 2021-08-29 MED ORDER — STERILE WATER FOR IRRIGATION IR SOLN
Status: DC | PRN
  Administered 2021-08-29: 1000 mL

## 2021-08-29 MED ORDER — PROPOFOL 10 MG/ML IV BOLUS
INTRAVENOUS | Status: AC
  Filled 2021-08-29: qty 20

## 2021-08-29 MED ORDER — TERBUTALINE SULFATE 1 MG/ML IJ SOLN
INTRAMUSCULAR | Status: AC
  Filled 2021-08-29: qty 1

## 2021-08-29 MED ORDER — ROCURONIUM BROMIDE 100 MG/10ML IV SOLN
INTRAVENOUS | Status: DC | PRN
  Administered 2021-08-29: 40 mg via INTRAVENOUS

## 2021-08-29 MED ORDER — CALCIUM GLUCONATE-NACL 1-0.675 GM/50ML-% IV SOLN
1.0000 g | Freq: Once | INTRAVENOUS | Status: AC
  Administered 2021-08-29: 1000 mg via INTRAVENOUS
  Filled 2021-08-29: qty 50

## 2021-08-29 MED ORDER — MENTHOL 3 MG MT LOZG: 1.0000 | LOZENGE | OROMUCOSAL | Status: DC | PRN

## 2021-08-29 MED ORDER — SIMETHICONE 80 MG PO CHEW: 80.0000 mg | CHEWABLE_TABLET | Freq: Three times a day (TID) | ORAL | Status: DC

## 2021-08-29 MED ORDER — PHENYLEPHRINE 40 MCG/ML (10ML) SYRINGE FOR IV PUSH (FOR BLOOD PRESSURE SUPPORT)
PREFILLED_SYRINGE | INTRAVENOUS | Status: DC | PRN
  Administered 2021-08-29: 80 ug via INTRAVENOUS
  Administered 2021-08-29 (×2): 200 ug via INTRAVENOUS
  Administered 2021-08-29 (×4): 80 ug via INTRAVENOUS
  Administered 2021-08-29: 160 ug via INTRAVENOUS

## 2021-08-29 MED ORDER — TRANEXAMIC ACID-NACL 1000-0.7 MG/100ML-% IV SOLN
INTRAVENOUS | Status: DC | PRN
  Administered 2021-08-29 (×2): 1000 mg via INTRAVENOUS

## 2021-08-29 MED ORDER — TRANEXAMIC ACID-NACL 1000-0.7 MG/100ML-% IV SOLN
INTRAVENOUS | Status: AC
Start: 2021-08-29 — End: ?
  Filled 2021-08-29: qty 100

## 2021-08-29 MED ORDER — TRANEXAMIC ACID-NACL 1000-0.7 MG/100ML-% IV SOLN
INTRAVENOUS | Status: AC
  Filled 2021-08-29: qty 100

## 2021-08-29 MED ORDER — ACETAMINOPHEN 500 MG PO TABS
1000.0000 mg | ORAL_TABLET | Freq: Four times a day (QID) | ORAL | Status: DC
  Administered 2021-08-29 – 2021-08-30 (×2): 1000 mg via ORAL
  Filled 2021-08-29 (×2): qty 2

## 2021-08-29 MED ORDER — LIDOCAINE-EPINEPHRINE (PF) 2 %-1:200000 IJ SOLN
INTRAMUSCULAR | Status: DC | PRN
  Administered 2021-08-29 (×4): 5 mL via EPIDURAL

## 2021-08-29 MED ORDER — FENTANYL CITRATE (PF) 100 MCG/2ML IJ SOLN
INTRAMUSCULAR | Status: AC
  Filled 2021-08-29: qty 2

## 2021-08-29 MED ORDER — MORPHINE SULFATE (PF) 0.5 MG/ML IJ SOLN
INTRAMUSCULAR | Status: DC | PRN
  Administered 2021-08-29: 3 mg via EPIDURAL

## 2021-08-29 MED ORDER — ONDANSETRON HCL 4 MG/2ML IJ SOLN: 4.0000 mg | Freq: Three times a day (TID) | INTRAMUSCULAR | Status: DC | PRN

## 2021-08-29 MED ORDER — DIBUCAINE (PERIANAL) 1 % EX OINT
1.0000 "application " | TOPICAL_OINTMENT | CUTANEOUS | Status: DC | PRN
  Filled 2021-08-29: qty 28

## 2021-08-29 MED ORDER — PHENYLEPHRINE 40 MCG/ML (10ML) SYRINGE FOR IV PUSH (FOR BLOOD PRESSURE SUPPORT): 80.0000 ug | PREFILLED_SYRINGE | INTRAVENOUS | Status: DC | PRN

## 2021-08-29 MED ORDER — FENTANYL CITRATE (PF) 250 MCG/5ML IJ SOLN
INTRAMUSCULAR | Status: AC
  Filled 2021-08-29: qty 5

## 2021-08-29 MED ORDER — CARBOPROST TROMETHAMINE 250 MCG/ML IM SOLN
INTRAMUSCULAR | Status: DC | PRN
  Administered 2021-08-29: 250 ug via INTRAMUSCULAR

## 2021-08-29 MED ORDER — PHENYLEPHRINE HCL-NACL 20-0.9 MG/250ML-% IV SOLN
INTRAVENOUS | Status: DC | PRN
  Administered 2021-08-29: 80 ug/min via INTRAVENOUS

## 2021-08-29 MED ORDER — WITCH HAZEL-GLYCERIN EX PADS
1.0000 "application " | MEDICATED_PAD | CUTANEOUS | Status: DC | PRN
  Filled 2021-08-29: qty 100

## 2021-08-29 MED ORDER — ROCURONIUM BROMIDE 10 MG/ML (PF) SYRINGE
PREFILLED_SYRINGE | INTRAVENOUS | Status: AC
Start: 2021-08-29 — End: ?
  Filled 2021-08-29: qty 10

## 2021-08-29 MED ORDER — PRENATAL MULTIVITAMIN CH: 1.0000 | ORAL_TABLET | Freq: Every day | ORAL | Status: DC

## 2021-08-29 MED ORDER — ONDANSETRON HCL 4 MG/2ML IJ SOLN
INTRAMUSCULAR | Status: DC | PRN
  Administered 2021-08-29: 4 mg via INTRAVENOUS

## 2021-08-29 MED ORDER — ONDANSETRON HCL 4 MG/2ML IJ SOLN
INTRAMUSCULAR | Status: AC
  Filled 2021-08-29: qty 2

## 2021-08-29 MED ORDER — PROPOFOL 500 MG/50ML IV EMUL
INTRAVENOUS | Status: AC
  Filled 2021-08-29: qty 50

## 2021-08-29 MED ORDER — DOCUSATE SODIUM 50 MG/5ML PO LIQD
100.0000 mg | Freq: Two times a day (BID) | ORAL | Status: DC | PRN
  Filled 2021-08-29: qty 10

## 2021-08-29 MED ORDER — CALCIUM GLUCONATE-NACL 1-0.675 GM/50ML-% IV SOLN
INTRAVENOUS | Status: AC
Start: 2021-08-29 — End: ?
  Filled 2021-08-29: qty 50

## 2021-08-29 MED ORDER — CHLORHEXIDINE GLUCONATE CLOTH 2 % EX PADS: 6.0000 | MEDICATED_PAD | Freq: Every day | CUTANEOUS | Status: DC

## 2021-08-29 MED ORDER — COCONUT OIL OIL
1.0000 "application " | TOPICAL_OIL | Status: DC | PRN
  Filled 2021-08-29: qty 120

## 2021-08-29 MED ORDER — SODIUM CHLORIDE 0.9% FLUSH: 3.0000 mL | INTRAVENOUS | Status: DC | PRN

## 2021-08-29 MED ORDER — CHLORHEXIDINE GLUCONATE 0.12% ORAL RINSE (MEDLINE KIT)
15.0000 mL | Freq: Two times a day (BID) | OROMUCOSAL | Status: DC
  Administered 2021-08-29 – 2021-08-30 (×2): 15 mL via OROMUCOSAL

## 2021-08-29 MED ORDER — DOCUSATE SODIUM 100 MG PO CAPS: 100.0000 mg | ORAL_CAPSULE | Freq: Two times a day (BID) | ORAL | Status: DC | PRN

## 2021-08-29 MED ORDER — FENTANYL CITRATE (PF) 250 MCG/5ML IJ SOLN
INTRAMUSCULAR | Status: DC | PRN
  Administered 2021-08-29 (×2): 50 ug via INTRAVENOUS

## 2021-08-29 MED ORDER — FENTANYL CITRATE (PF) 100 MCG/2ML IJ SOLN
INTRAMUSCULAR | Status: DC | PRN
Start: 2021-08-29 — End: 2021-08-29
  Administered 2021-08-29 (×2): 100 ug via EPIDURAL

## 2021-08-29 MED ORDER — LACTATED RINGERS IV SOLN
500.0000 mL | Freq: Once | INTRAVENOUS | Status: AC
  Administered 2021-08-29: 500 mL via INTRAVENOUS

## 2021-08-29 MED ORDER — FENTANYL CITRATE (PF) 100 MCG/2ML IJ SOLN
50.0000 ug | INTRAMUSCULAR | Status: DC | PRN
  Administered 2021-08-29 – 2021-08-30 (×5): 100 ug via INTRAVENOUS
  Filled 2021-08-29 (×4): qty 2

## 2021-08-29 MED ORDER — SODIUM CHLORIDE 0.9 % IV SOLN
500.0000 mg | Freq: Once | INTRAVENOUS | Status: AC
  Administered 2021-08-29: 500 mg via INTRAVENOUS
  Filled 2021-08-29: qty 5

## 2021-08-29 MED ORDER — NOREPINEPHRINE 4 MG/250ML-% IV SOLN
0.0000 ug/min | INTRAVENOUS | Status: DC
  Administered 2021-08-30: 5 ug/min via INTRAVENOUS

## 2021-08-29 MED ORDER — FENTANYL-BUPIVACAINE-NACL 0.5-0.125-0.9 MG/250ML-% EP SOLN
12.0000 mL/h | EPIDURAL | Status: DC | PRN
  Administered 2021-08-29: 12 mL/h via EPIDURAL
  Filled 2021-08-29: qty 250

## 2021-08-29 MED ORDER — LACTATED RINGERS IV BOLUS
1000.0000 mL | Freq: Once | INTRAVENOUS | Status: AC
  Administered 2021-08-29: 1000 mL via INTRAVENOUS

## 2021-08-29 MED ORDER — OXYTOCIN-SODIUM CHLORIDE 30-0.9 UT/500ML-% IV SOLN: 2.5000 [IU]/h | INTRAVENOUS | Status: DC

## 2021-08-29 MED ORDER — NALOXONE HCL 4 MG/10ML IJ SOLN
1.0000 ug/kg/h | INTRAVENOUS | Status: DC | PRN
  Filled 2021-08-29: qty 5

## 2021-08-29 MED ORDER — BUPIVACAINE HCL (PF) 0.25 % IJ SOLN
INTRAMUSCULAR | Status: DC | PRN
  Administered 2021-08-29: 8 mL via EPIDURAL

## 2021-08-29 MED ORDER — TETANUS-DIPHTH-ACELL PERTUSSIS 5-2.5-18.5 LF-MCG/0.5 IM SUSY
0.5000 mL | PREFILLED_SYRINGE | Freq: Once | INTRAMUSCULAR | Status: AC
  Administered 2021-08-30: 0.5 mL via INTRAMUSCULAR
  Filled 2021-08-29: qty 0.5

## 2021-08-29 SURGICAL SUPPLY — 39 items
BENZOIN TINCTURE PRP APPL 2/3 (GAUZE/BANDAGES/DRESSINGS) ×3 IMPLANT
CHLORAPREP W/TINT 26ML (MISCELLANEOUS) ×3 IMPLANT
CLAMP CORD UMBIL (MISCELLANEOUS) IMPLANT
CLOTH BEACON ORANGE TIMEOUT ST (SAFETY) ×3 IMPLANT
DERMABOND ADVANCED (GAUZE/BANDAGES/DRESSINGS) ×1
DERMABOND ADVANCED .7 DNX12 (GAUZE/BANDAGES/DRESSINGS) IMPLANT
DRSG OPSITE POSTOP 4X10 (GAUZE/BANDAGES/DRESSINGS) ×3 IMPLANT
ELECT REM PT RETURN 9FT ADLT (ELECTROSURGICAL) ×3
ELECTRODE REM PT RTRN 9FT ADLT (ELECTROSURGICAL) ×2 IMPLANT
EXTRACTOR VACUUM M CUP 4 TUBE (SUCTIONS) IMPLANT
GLOVE BIOGEL PI IND STRL 7.0 (GLOVE) ×4 IMPLANT
GLOVE BIOGEL PI IND STRL 7.5 (GLOVE) ×4 IMPLANT
GLOVE BIOGEL PI INDICATOR 7.0 (GLOVE) ×2
GLOVE BIOGEL PI INDICATOR 7.5 (GLOVE) ×2
GLOVE ECLIPSE 7.5 STRL STRAW (GLOVE) ×3 IMPLANT
GOWN STRL REUS W/TWL LRG LVL3 (GOWN DISPOSABLE) ×9 IMPLANT
HEMOSTAT ARISTA ABSORB 1G (HEMOSTASIS) ×1 IMPLANT
KIT ABG SYR 3ML LUER SLIP (SYRINGE) IMPLANT
NDL HYPO 25X5/8 SAFETYGLIDE (NEEDLE) IMPLANT
NEEDLE HYPO 25X5/8 SAFETYGLIDE (NEEDLE) IMPLANT
NS IRRIG 1000ML POUR BTL (IV SOLUTION) ×3 IMPLANT
PACK C SECTION WH (CUSTOM PROCEDURE TRAY) ×3 IMPLANT
PAD OB MATERNITY 4.3X12.25 (PERSONAL CARE ITEMS) ×3 IMPLANT
PENCIL SMOKE EVAC W/HOLSTER (ELECTROSURGICAL) ×3 IMPLANT
RTRCTR C-SECT PINK 25CM LRG (MISCELLANEOUS) ×3 IMPLANT
STRIP CLOSURE SKIN 1/2X4 (GAUZE/BANDAGES/DRESSINGS) ×3 IMPLANT
SUT VIC AB 0 CT1 27 (SUTURE) ×9
SUT VIC AB 0 CT1 27XBRD ANBCTR (SUTURE) IMPLANT
SUT VIC AB 0 CTX 36 (SUTURE) ×12
SUT VIC AB 0 CTX36XBRD ANBCTRL (SUTURE) ×6 IMPLANT
SUT VIC AB 2-0 CT1 (SUTURE) ×1 IMPLANT
SUT VIC AB 2-0 CT1 18 (SUTURE) ×2 IMPLANT
SUT VIC AB 2-0 CT1 27 (SUTURE) ×3
SUT VIC AB 2-0 CT1 TAPERPNT 27 (SUTURE) ×2 IMPLANT
SUT VIC AB 4-0 KS 27 (SUTURE) ×5 IMPLANT
TOWEL OR 17X24 6PK STRL BLUE (TOWEL DISPOSABLE) ×3 IMPLANT
TRAY FOLEY W/BAG SLVR 14FR LF (SET/KITS/TRAYS/PACK) ×3 IMPLANT
WATER STERILE IRR 1000ML POUR (IV SOLUTION) ×3 IMPLANT
floseal hemostatic matrix ×2 IMPLANT

## 2021-08-29 NOTE — Progress Notes (Signed)
Patient ID: LADASHIA DEMARINIS, female   DOB: 11-26-1995, 26 y.o.   MRN: [id number] ? ?Late documentation: ? ?Patient status post cesarean section. Got called back to OR at 1215, due to dropping blood pressures. Pressors started. Bimanual exam done, revealing large amount of blood - estimated to be about 1.5L. TXA requested, Mel Almond called for. Decision was made to start transfusing blood products. MTP called in order to expedite blood products. Jada placed and put to suction with vaginal balloon filled to . Methergine given. 3 units of blood given and 1 unit of FFP. BPs were stabilizing, but still needing pressors. Her abdomen was noted to be distended. Dr Shawnie Pons consulted at 1258. She arrived had approximately of blood in the canister from the [place] and not bleeding around it. Due to worsening abdominal distention, the decision was made to do an exploratory laparotomy. I discussed risks of infection, bleeding, injury to abdominal organs, need for hysterectomy. I also discussed these risks with patient's partner. ? ?Levie Heritage, DO ?08/29/2021 ?3:12 PM ? ?

## 2021-08-29 NOTE — Anesthesia Procedure Notes (Signed)
Epidural ?Patient location during procedure: OB ?Start time: 08/29/2021 3:18 AM ?End time: 08/29/2021 3:22 AM ? ?Staffing ?Anesthesiologist: Shelton Silvas, MD ?Performed: anesthesiologist  ? ?Preanesthetic Checklist ?Completed: patient identified, IV checked, site marked, risks and benefits discussed, surgical consent, monitors and equipment checked, pre-op evaluation and timeout performed ? ?Epidural ?Patient position: sitting ?Prep: DuraPrep ?Patient monitoring: heart rate, continuous pulse ox and blood pressure ?Approach: midline ?Location: L3-L4 ?Injection technique: LOR saline ? ?Needle:  ?Needle type: Tuohy  ?Needle gauge: 17 G ?Needle length: 9 cm ?Catheter type: closed end flexible ?Catheter size: 20 Guage ?Test dose: negative and 1.5% lidocaine ? ?Assessment ?Events: blood not aspirated, injection not painful, no injection resistance and no paresthesia ? ?Additional Notes ?LOR @ 4.5 ? ?Patient identified. Risks/Benefits/Options discussed with patient including but not limited to bleeding, infection, nerve damage, paralysis, failed block, incomplete pain control, headache, blood pressure changes, nausea, vomiting, reactions to medications, itching and postpartum back pain. Confirmed with bedside nurse the patient's most recent platelet count. Confirmed with patient that they are not currently taking any anticoagulation, have any bleeding history or any family history of bleeding disorders. Patient expressed understanding and wished to proceed. All questions were answered. Sterile technique was used throughout the entire procedure. Please see nursing notes for vital signs. Test dose was given through epidural catheter and negative prior to continuing to dose epidural or start infusion. Warning signs of high block given to the patient including shortness of breath, tingling/numbness in hands, complete motor block, or any concerning symptoms with instructions to call for help. Patient was given instructions on  fall risk and not to get out of bed. All questions and concerns addressed with instructions to call with any issues or inadequate analgesia.   ? ?Reason for block:procedure for pain ? ? ? ?

## 2021-08-29 NOTE — Anesthesia Postprocedure Evaluation (Signed)
Anesthesia Post Note ? ?Patient: Lori Baldwin ? ?Procedure(s) Performed: CESAREAN SECTION Exploratory Laparotomy ?EXPLORATORY LAPAROTOMY ?HYSTERECTOMY ABDOMINAL ? ?  ? ?Patient location during evaluation: ICU ?Anesthesia Type: General ?Level of consciousness: patient remains intubated per anesthesia plan and sedated ?Pain management: pain level controlled ?Vital Signs Assessment: post-procedure vital signs reviewed and stable ?Respiratory status: patient remains intubated per anesthesia plan, patient on ventilator - see flowsheet for VS and respiratory function stable ?Cardiovascular status: stable ?Anesthetic complications: no ?Comments: Report given to ICU provider prior to transfer.  ? ? ? ?No notable events documented. ?  ?  ? ?Shanda Howells ? ? ? ? ?

## 2021-08-29 NOTE — Lactation Note (Signed)
This note was copied from a baby's chart. ?Lactation Consultation Note ? ?Patient Name: Lori Baldwin ?Today's Date: 08/29/2021 ?  ?Age:27 hours ?Per RN Nira Conn) mom is intubated at this time on [address] [place], [place] services with follow up later.  ?Maternal Data ?  ? ?Feeding ?Nipple Type: Slow - flow ? ?LATCH Score ?  ? ?  ? ?  ? ?  ? ?  ? ?  ? ? ?Lactation Tools Discussed/Used ?  ? ?Interventions ?  ? ?Discharge ?  ? ?Consult Status ?  ? ? ? ?Vicente Serene ?08/29/2021, 6:00 PM ? ? ? ?

## 2021-08-29 NOTE — Transfer of Care (Signed)
Immediate Anesthesia Transfer of Care Note ? ?Patient: Lori Baldwin ? ?Procedure(s) Performed: CESAREAN SECTION Exploratory Laparotomy ?EXPLORATORY LAPAROTOMY ?HYSTERECTOMY ABDOMINAL ? ?Patient Location: PACU ? ?Anesthesia Type:General and Epidural ? ?Level of Consciousness: Patient remains intubated per anesthesia plan ? ?Airway & Oxygen Therapy: Patient remains intubated per anesthesia plan and Patient placed on Ventilator (see vital sign flow sheet for setting) ? ?Post-op Assessment: Report given to RN and Post -op Vital signs reviewed and stable ? ?Post vital signs: Reviewed and stable ? ?Last Vitals:  ?Vitals Value Taken Time  ?BP    ?Temp    ?Pulse 70 08/29/21 1553  ?Resp 21 08/29/21 1553  ?SpO2 98 % 08/29/21 1553  ?Vitals shown include unvalidated device data. ? ?Last Pain:  ?Vitals:  ? 08/29/21 1041  ?TempSrc: Oral  ?PainSc:   ?   ? ?  ? ?Complications: No notable events documented. ?

## 2021-08-29 NOTE — Progress Notes (Signed)
Labor Progress Note ?Lori Baldwin is a 26 y.o. [phone] at [redacted]w[redacted]d who presented for IOL/TOLAC due to A1GDM. ? ?S: Feeling more pain in her bottom area, pressing epidural button to help with this. Called to bedside by RN due to decelerations after ROM on cervical exam.  ? ?O:  ?BP 112/64   Pulse 93   Temp 98 ?F (36.7 ?C) (Oral)   Resp 16   Ht 5' (1.524 m)   Wt 63 kg   LMP 11/16/2020 (Within Days)   SpO2 94%   BMI 27.15 kg/m?  ? ?EFM: Baseline 135 bpm, min-mod variability, no accels, late/prolonged decel ?Toco: Every 3 minutes  ? ?CVE: Dilation: 5.5 ?Effacement (%): 70 ?Station: -2 ?Presentation: Vertex ?Exam by:: Dr. Annia Friendly ? ?A&P: 26 y.o. [id number] [redacted]w[redacted]d  ? ?#Labor: Progressing s/p Pitocin and Cook's catheter. ROM with recent check by RN. Patient then started to have recurrent late decelerations. Pitocin discontinued. IUPC placed. Due to persistent decelerations, terbutaline given. Dr. Vergie Living called to bedside. Patient positioned in hands and knees position. Amnioinfusion started. FSE placed. FHT then started to improve. Baseline 150 bpm. Will allow for fetal recovery for 45 min to 1 hr and then consider restarting Pitocin as able.  ?#Pain: Epidural ?#FWB: Cat 2, improving s/p Pitocin discontinuation, terbutaline, and amnioinfusion. Will continue to monitor closely.   ?#GBS negative ? ?Worthy Rancher, MD ?7:09 AM ? ?

## 2021-08-29 NOTE — Progress Notes (Addendum)
Lori Baldwin is a 26 y.o. [phone] at [redacted]w[redacted]d  admitted for TOLAC/ induction of labor due to A1GDM. ? ?Patient is now s/p cooks and AROM/SROM during check and subsequent recurrent late decels and prolonged which resolved ultimately with amnioinfusion and stopping pitocin. Currently without any decels. Cervix unchanged since last check/AROM/SROM during check. Went in to room to discuss restarting pitocin.  ? ? ?Objective: ?BP 114/66   Pulse 96   Temp 98 ?F (36.7 ?C) (Oral)   Resp 16   Ht 5' (1.524 m)   Wt 63 kg   LMP 11/16/2020 (Within Days)   SpO2 94%   BMI 27.15 kg/m?  ?No intake/output data recorded. ?No intake/output data recorded. ? ? ?FHT:  FHR: 145 bpm, variability: moderate,  accelerations:  Abscent,  decelerations:  Absent ?UC:   every 3-4 minutes, some coupling ?SVE:   Dilation: 5 ?Effacement (%): 70 ?Station: -2 ?Exam by:: Ty Hilts, RN ? ?Subjective: ?Reports she is having 8-9/10 pain in buttocks with contractions. Epidural redosed by anesthesiologist recently without relief of buttock pain. Initially when entered room patient inquiring about cesarean section. Reports she is feeling pretty exhausted and is very concerned about the pain control. We discussed in depth regarding TOLAC desire and initially patient reports if her pain was better controlled she would still desire TOLAC. We discussed that it is possible that as fetal head descends, position may change which could relieve some of her pain however we cannot predict that for sure.  ? ?We discussed giving pitocin a trial and if patient decides at that time that she opts for a repeat cesarean for pain reasons or if there is any evidence of fetal intolerance we would reassess and if warranted can proceed with cesarean delivery at that time. Patient initially amenable to that plan and pitocin briefly restarted (for 1 minute)  ? ?However, as pitocin being turned on patient states she has changed her mind and she would like to proceed with a  cesarean delivery and not TOLAC at this time. At that time we discussed the risks of cesarean delivery in detail as listed below as well as increase recovery time. Patient continues to opt for repeat cesarean delivery.  ? ?The risks of cesarean section were discussed with the patient including but were not limited to: bleeding which may require transfusion or reoperation; infection which may require antibiotics; injury to bowel, bladder, ureters or other surrounding organs; injury to the fetus; need for additional procedures including hysterectomy in the event of a life-threatening hemorrhage; placental abnormalities wth subsequent pregnancies, incisional problems, thromboembolic phenomenon and other postoperative/anesthesia complications.  Patient will remain NPO for procedure. Anesthesia and OR aware.  Preoperative prophylactic antibiotics and SCDs ordered on call to the OR.  To OR when ready. ? ? ?Warner Mccreedy, MD, MPH ?OB Fellow, Faculty Practice ? ?Attestation of Attending Supervision of Obstetric Fellow: Evaluation and management procedures were performed by the Obstetric Fellow under my supervision and collaboration.  I have reviewed the Obstetric Fellow's note and chart, and I agree with the management and plan. ? ?Candelaria Celeste, DO ?Attending Physician ?Faculty Practice, [facility] ? ?

## 2021-08-29 NOTE — Op Note (Addendum)
Preoperative diagnosis: Postoperative hemorrhage, hemorrhagic shock ? ?Pain postoperative diagnosis: Same ? ?Procedure: Exam under anesthesia, Exploratory laparotomy, repair of rent in the broad, Cesarean supracervical hysterectomy ? ?Surgeon:Rakeem Colley S ? ?Assistant: Candelaria Celeste, DO ?An experienced assistant was required given the standard of surgical care given the complexity of the case.  This assistant was needed for exposure, dissection, suctioning, retraction, instrument exchange. ? ?Anesthesia:  Gen. endotracheal tube-Howze, Jaclynn Major, MD ? ?Findings: Large hemoperitoneum, rent in the posterior broad, LUS continued to fill with clot and debris ? ?Estimated blood loss: 5397 cc ? ?Specimens: Uterus, bilateral tubes ? ?Disposition of specimen: Pathology ? ?Reason for Procedure: Lori Baldwin is a 26 y.o., [phone] who presents with TOLAC who decided for RCS. See previous notes for RCS with Dr. Adrian Blackwater. I was called when she was hypotensive and had a Jada and several uterotonics. Her abdomen was distended. Bedside u/s revealed probable intraperitoneal hemorrhage. ? ?Procedure: Patient was taken to the OR. Timeout was performed. The patient was prepped and draped in the usual sterile fashion. A Foley catheter was present inside her bladder. A Pfannenstiel incision was reopened. A large amount hemoperitoneum was noted. The uterus was exteriorized and a rent in the posterior broad was bleeding. A figure of eight was placed x 2 and this stopped bleeding. Clot removed for the abdomen. Floseal used here. Hemabate injected into uterus. LUS filled with clot and debris. Uterus returned abdomen and fascia closed. Partial closure of the skin and the Jada suction kept filling up and vaginal sweep revealed more clot and patient was still not stable so decision made to proceed with hysterectomy. Jada removed. Fascia reopened and uterus exteriorized again. Kelly clamps were used to elevate the uterus. The round ligaments  were identified clamped cut and ligated. The infundibulopelvic ligaments were identified bilaterally. They were clamped, cut, and  ligated. The uterine vessels were clamped, cut and ligated. Kelly Clamps placed across the lower uterine segment with care to avoid the bladder and supracervical hysterectomy completed. A locked running suture used to close the LUS. Several more figure of eights used to achieve hemostasis. The tubes were then removed. Floseal used on the pedicles which were noted to be hemostatic.The fascia is closed with 0 Vicryl suture in a running fashion. The skin was closed using 3-0 VIcryl on a Keith needle. All instrument, needle and lap counts correct x 2. The patient was awakened and taken to recovery room in stable condition. ? ?[name] ?08/29/2021 ?3:18 PM  ? ? ?

## 2021-08-29 NOTE — Discharge Summary (Signed)
? ?  Postpartum Discharge Summary ? ?Date of Service 09/02/21 ? ?   ?Patient Name: Lori Baldwin [facility] ?DOB: 1996-03-02 ?MRN: [id number] ? ?Date of admission: 08/28/2021 ?Delivery date:08/29/2021  ?Delivering provider: Truett Mainland  ?Date of discharge: 09/02/2021 ? ?Admitting diagnosis: Encounter for supervision of high risk pregnancy in third trimester, antepartum [O09.93] ?Intrauterine pregnancy: [redacted]w[redacted]d    ?Secondary diagnosis:  Principal Problem: ?  Status post repeat low transverse cesarean section ?Active Problems: ?  Supervision of high-risk pregnancy ?  Hemorrhagic shock (HMohave ?  Postoperative anemia due to acute blood loss ? ?Additional problems: Uterine atony, PPH with DIC   ?Discharge diagnosis: Term Pregnancy Delivered and GDM A1                                              ?Post partum procedures: C hysterectomy ?Augmentation: Pitocin and IP Foley ?Complications: Uterine atony, PPH with DIC requiring c hyst ? ?Hospital course: Induction of Labor With Cesarean Section   ?26y.o. yo [id number] admitted to the hospital 08/28/2021 for induction of labor. Patient had a labor course significant for presented for TOLAC/IOL. She had pitocin and a foley balloon placed and got to 5 cm. [facility] had Cat II tracing (variable decels and a prolonged decel) that improved with stopping pitocin. Patient then decided to proceed with repeat CS due to pain control as well as fetal status and prior decelerations. Patient was taken for cesarean section and immediately prior to start of surgery fetal bradycardia was noted and was converted to STAT. Delivery details are as follows: ?Membrane Rupture Time/Date: 6:24 AM ,08/29/2021   ?Delivery Method:C-Section, Low Vertical  ?Details of operation can be found in separate Operative Note. Patient was found to have continued bleeding and low blood pressures requiring pressors. Uterotonics given and Jada placed but abdomen became more distended. Re-opened. Found  rent in posterior broad, repaired. 2.5 L blood in abdomen. Continuted to have vaginal bleeding and LUS filling with blood so decision for C-hyst. MTP called and patient received 8 u PRBCs, 4 units of FFP, and cryo and 2 units of plts. Postoperatively she was transferred to ICU, where she remained overnight. Given 1 more unit of PRBCs in the am, extubated, weaned off pressors and transferred to the floor. She slowly progressed to ambulating to the restroom and in the room. She was voiding without problems. Pain medication was adjusted for optimal control. She received IV iron infusion. Tolerating diet . Discharge reviewed with pt. Acknowledge pt's concern for poor appetite, pain and traumatic birthing  experience. Explained to pt and her mother that pt she meeting milestones for discharge home. She will be able to continue her recovery at home. Discharge instructions, medications and follow up reviewed. Pt verbalized understanding.   Patient is discharged home in stable condition on 09/02/2021.     ? ?Newborn Data: ?Birth date:08/29/2021  ?Birth time:11:31 AM  ?Gender:Female  ?Living status:Living  ?Apgars:8 ,9  ?Weight:2948 g     As recorded                          ? ?Magnesium Sulfate received: No ?BMZ received: No ?Rhophylac:N/A ?MMJYN:WGNFAOZost partum ?T-DaP:Given prenatally ?Flu: No ?Transfusion:Yes ? ?Physical exam  ?Vitals:  ? 09/01/21 2325 09/02/21 0457 09/02/21 0554 09/02/21 0810  ?  BP:  (!) 95/59  (!) 95/55  ?Pulse:  73  73  ?Resp:  16  17  ?Temp:  98 ?F (36.7 ?C)  98 ?F (36.7 ?C)  ?TempSrc:    Oral  ?SpO2: 94% 100%  98%  ?Weight:   58.1 kg   ?Height:      ? ?General: alert ?Lochia: None ?Uterine Fundus: NA ?Incision: Healing well with no significant drainage ?DVT Evaluation: No evidence of DVT seen on physical exam. ?Labs: ?Lab Results  ?Component Value Date  ? WBC 17.1 (H) 09/01/2021  ? HGB 7.7 (L) 09/01/2021  ? HCT 22.5 (L) 09/01/2021  ? MCV 88.6 09/01/2021  ? PLT 158 09/01/2021  ? ? ?  Latest Ref Rng &  Units 08/30/2021  ?  4:42 AM  ?CMP  ?Glucose 70 - 99 mg/dL 114    ?BUN 6 - 20 mg/dL 6    ?Creatinine 0.44 - 1.00 mg/dL 0.61    ?Sodium 135 - 145 mmol/L 136    ?Potassium 3.5 - 5.1 mmol/L 3.3    ?Chloride 98 - 111 mmol/L 107    ?CO2 22 - 32 mmol/L 24    ?Calcium 8.9 - 10.3 mg/dL 7.8    ? ?Edinburgh Score: ? ?  08/31/2021  ?  1:23 PM  ?Edinburgh Postnatal Depression Scale Screening Tool  ?I have been able to laugh and see the funny side of things. 0  ?I have looked forward with enjoyment to things. 0  ?I have blamed myself unnecessarily when things went wrong. 0  ?I have been anxious or worried for no good reason. 0  ?I have felt scared or panicky for no good reason. 0  ?Things have been getting on top of me. 0  ?I have been so unhappy that I have had difficulty sleeping. 0  ?I have felt sad or miserable. 0  ?I have been so unhappy that I have been crying. 0  ?The thought of harming myself has occurred to me. 0  ?Edinburgh Postnatal Depression Scale Total 0  ? ? ? ?After visit meds:  ?Allergies as of 09/02/2021   ? ?   Reactions  ? Aspirin Itching  ? Pt states she does fine when she takes ibuprofen   ? ?  ? ?  ?Medication List  ?  ? ?STOP taking these medications   ? ?Accu-Chek Guide test strip ?Generic drug: glucose blood ?  ?Accu-Chek Softclix Lancets lancets ?  ?promethazine 25 MG tablet ?Commonly known as: PHENERGAN ?  ?promethazine 6.25 MG/5ML syrup ?Commonly known as: PHENERGAN ?  ? ?  ? ?TAKE these medications   ? ?gabapentin 300 MG capsule ?Commonly known as: NEURONTIN ?Take 1 capsule (300 mg total) by mouth 2 (two) times daily. ?  ?ibuprofen 600 MG tablet ?Commonly known as: ADVIL ?Take 1 tablet (600 mg total) by mouth 4 (four) times daily. ?  ?Oxycodone HCl 10 MG Tabs ?Take 1 tablet (10 mg total) by mouth every 6 (six) hours as needed for severe pain or moderate pain. ?  ?PrePLUS 27-1 MG Tabs ?Take 1 tablet by mouth daily. ?  ? ?  ? ? ? ?Discharge home in stable condition ?Infant Feeding: Bottle ?Infant  Disposition:home with mother ?Discharge instruction: per After Visit Summary and Postpartum booklet. ?Activity: Advance as tolerated. Pelvic rest for 6 weeks.  ?Diet: routine diet ?Future Appointments: ?Future Appointments  ?Date Time Provider [place]  ?09/05/2021  2:30 PM [facility] NURSE [facility] [place]  ?09/27/2021  8:20 AM [facility] LAB  [facility]  ?09/27/2021  8:55 AM Gabriel Carina, CNM [facility]  ? ?Follow up Visit: ? Follow-up Information   ? ? [facility] Follow up.   ?Why: 1 week for incision check ?4 weeks for PP visit ?Contact information: ?[address] ?[place] [address] ?[phone] ? ?  ?  ? ?  ?  ? ?  ? ?Message sent to [facility] by Dr. Cy Blamer on 4/4 ? ?Please schedule this patient for a In person postpartum visit in 4 weeks with the following provider: Any provider. ?Additional Postpartum F/U:2 hour GTT and Incision check 1 week  ?High risk pregnancy complicated by: GDM ?Delivery mode:  C-Section, Low Vertical  ?Anticipated Birth Control:   plans condoms ? ? ?09/02/2021 ?Chancy Milroy, MD ? ? ? ?

## 2021-08-29 NOTE — Anesthesia Procedure Notes (Signed)
Central Venous Catheter Insertion ?Performed by: Kaylyn Layer, MD, anesthesiologist ?Start/End4/08/2021 2:18 PM, 08/29/2021 2:21 PM ?Patient location: OR. ?Emergency situation ?Preanesthetic checklist: patient identified, IV checked, monitors and equipment checked, pre-op evaluation, timeout performed and anesthesia consent ?Position: Trendelenburg ?Patient sedated ?Hand hygiene performed  and maximum sterile barriers used  ?Catheter size: 8 Fr ?Total catheter length 16. ?Central line was placed.Double lumen ?Procedure performed using ultrasound guided technique. ?Ultrasound Notes:anatomy identified, needle tip was noted to be adjacent to the nerve/plexus identified, no ultrasound evidence of intravascular and/or intraneural injection and image(s) printed for medical record ?Attempts: 1 ?Following insertion, dressing applied, line sutured and Biopatch. ?Post procedure assessment: blood return through all ports ? ?Patient tolerated the procedure well with no immediate complications. ?Additional procedure comments: Placed under sterile conditions in OR under general anesthesia for emergent need of additional access.. ? ? ? ? ?

## 2021-08-29 NOTE — Progress Notes (Signed)
Labor Progress Note ?Lori Baldwin is a 26 y.o. [phone] at [redacted]w[redacted]d who presented for IOL/TOLAC due to A1GDM.  ? ?S: Doing well. Feeling some cramping regularly. Tolerating well. No concerns at this time.  ? ?O:  ?BP (!) 101/56   Pulse 76   Temp 98.1 ?F (36.7 ?C) (Oral)   Resp 16   Ht 5' (1.524 m)   Wt 63 kg   LMP 11/16/2020 (Within Days)   BMI 27.15 kg/m?  ? ?EFM: Baseline 125 bpm, moderate variability, + accels, no decels  ?Toco: Every 2-3 minutes  ? ?CVE: Dilation: 1 ?Effacement (%): Thick ?Presentation: Vertex ?Exam by:: Dr. Annia Friendly ? ?A&P: 26 y.o. [id number] [redacted]w[redacted]d  ? ?#Labor: Progressing. Cook's catheter placed without difficulty. Uterine balloon filled with 60 cc. Mom and baby tolerated this well. Pitocin currently at 10 milliUnits/min. Will continue at current rate and plan to reassess in 4 hours.  ?#Pain: IV Fentanyl PRN ?#FWB: Cat 1  ?#GBS negative ? ?#A1GDM: CBGs remain at goal. Will continue to monitor every 4 hours in latent phase. ? ?Worthy Rancher, MD ?1:27 AM ? ?

## 2021-08-29 NOTE — Anesthesia Preprocedure Evaluation (Signed)
Anesthesia Evaluation  ?Patient identified by MRN, date of birth, ID band ?Patient awake ? ? ? ?Reviewed: ?Allergy & Precautions, Patient's Chart, lab work & pertinent test results ? ?Airway ?Mallampati: I ? ? ? ? ? ? Dental ?no notable dental hx. ? ?  ?Pulmonary ?neg pulmonary ROS,  ?  ?Pulmonary exam normal ? ? ? ? ? ? ? Cardiovascular ?negative cardio ROS ?Normal cardiovascular exam ? ? ?  ?Neuro/Psych ? Headaches,   ? GI/Hepatic ?negative GI ROS, Neg liver ROS,   ?Endo/Other  ?diabetes, Gestational ? Renal/GU ?  ? ?  ?Musculoskeletal ? ? Abdominal ?  ?Peds ? Hematology ?  ?Anesthesia Other Findings ? ? Reproductive/Obstetrics ?(+) Pregnancy ? ?  ? ? ? ? ? ? ? ? ? ? ? ? ? ?  ?  ? ? ? ? ? ? ? ?Anesthesia Physical ?Anesthesia Plan ? ?ASA: 2 ? ?Anesthesia Plan: Epidural  ? ?Post-op Pain Management:   ? ?Induction:  ? ?PONV Risk Score and Plan:  ? ?Airway Management Planned:  ? ?Additional Equipment: None ? ?Intra-op Plan:  ? ?Post-operative Plan:  ? ?Informed Consent: I have reviewed the patients History and Physical, chart, labs and discussed the procedure including the risks, benefits and alternatives for the proposed anesthesia with the patient or authorized representative who has indicated his/her understanding and acceptance.  ? ? ? ? ? ?Plan Discussed with:  ? ?Anesthesia Plan Comments: (Lab Results ?     Component                Value               Date                 ?     WBC                      7.8                 08/28/2021           ?     HGB                      11.3 (L)            08/28/2021           ?     HCT                      34.0 (L)            08/28/2021           ?     MCV                      84.4                08/28/2021           ?     PLT                      127 (L)             08/28/2021           ?)  ? ? ? ? ? ?Anesthesia Quick Evaluation ? ?

## 2021-08-29 NOTE — Anesthesia Procedure Notes (Addendum)
Arterial Line Insertion ?Start/End4/08/2021 1:32 PM, 08/29/2021 1:35 PM ?Performed by: Kaylyn Layer, MD, anesthesiologist ? Patient location: Pre-op. ?Preanesthetic checklist: patient identified, IV checked, site marked, monitors and equipment checked, pre-op evaluation, timeout performed and anesthesia consent ?Emergency situation ?Patient sedated ?Left, radial was placed ?Catheter size: 20 G ?Hand hygiene performed  and maximum sterile barriers used  ? ?Attempts: 1 ?Procedure performed without using ultrasound guided technique. ?Following insertion, dressing applied. ?Post procedure assessment: normal and unchanged ? ?Patient tolerated the procedure well with no immediate complications. ?Additional procedure comments: Place in OR under sterile conditions for emergent need for hemodynamic monitoring and lab draws.. ? ? ? ?

## 2021-08-29 NOTE — Anesthesia Procedure Notes (Signed)
Procedure Name: Intubation ?Date/Time: 08/29/2021 1:26 PM ?Performed by: Renford Dills, CRNA ?Pre-anesthesia Checklist: Patient identified, Emergency Drugs available, Suction available, Patient being monitored and Timeout performed ?Patient Re-evaluated:Patient Re-evaluated prior to induction ?Oxygen Delivery Method: Circle system utilized ?Preoxygenation: Pre-oxygenation with 100% oxygen ?Induction Type: IV induction, Rapid sequence and Cricoid Pressure applied ?Laryngoscope Size: Glidescope and 3 ?Grade View: Grade I ?Tube type: Oral ?Tube size: 7.0 mm ?Number of attempts: 1 ?Airway Equipment and Method: Stylet and Video-laryngoscopy ?Placement Confirmation: ETT inserted through vocal cords under direct vision, breath sounds checked- equal and bilateral and CO2 detector ?Secured at: 21 cm ?Tube secured with: Tape ?Dental Injury: Teeth and Oropharynx as per pre-operative assessment  ? ? ? ? ?

## 2021-08-29 NOTE — Op Note (Addendum)
Lori Baldwin ?PROCEDURE DATE: 08/29/2021 ? ?PREOPERATIVE DIAGNOSIS: Intrauterine pregnancy at  [redacted]w[redacted]d weeks gestation;  failed TOLAC ? ?POSTOPERATIVE DIAGNOSIS: The same ? ?PROCEDURE: Repeat Low Transverse Cesarean Section ? ?SURGEON:  Dr. Candelaria Celeste ? ?ASSISTANT: Dr Warner Mccreedy ? ?INDICATIONS: Lori Baldwin is a 26 y.o. [id number] at [redacted]w[redacted]d scheduled for cesarean section secondary to  Failed TOLAC .  The risks of cesarean section discussed with the patient included but were not limited to: bleeding which may require transfusion or reoperation; infection which may require antibiotics; injury to bowel, bladder, ureters or other surrounding organs; injury to the fetus; need for additional procedures including hysterectomy in the event of a life-threatening hemorrhage; placental abnormalities wth subsequent pregnancies, incisional problems, thromboembolic phenomenon and other postoperative/anesthesia complications. The patient concurred with the proposed plan, giving informed written consent for the procedure.   ? ?FINDINGS:  Viable female infant in vertex, OP presentation.  Apgars 8 and 9, weight, pending.  Clear amniotic fluid.  Intact placenta, three vessel cord.  Normal uterus, fallopian tubes and ovaries bilaterally. ? ?ANESTHESIA:    Spinal ?INTRAVENOUS FLUIDS:654ml ?ESTIMATED BLOOD LOSS: 800 ml ?URINE OUTPUT:  300 ml ?SPECIMENS: Placenta sent to L&D ?COMPLICATIONS: None immediate ? ?PROCEDURE IN DETAIL:  The patient received intravenous antibiotics and had sequential compression devices applied to her lower extremities while in the preoperative area.  She was then taken to the operating room where epidural anesthesia was dosed up to surgical level and was found to be adequate.  A foley catheter was placed into her bladder and attached to constant gravity, which drained clear fluid throughout. FHT were attempted, but unable to obtain. The decision was made to convert the procedure to a STAT cesarean section.  Betadine was used to clean the skin.  ? ?After a timeout was performed, a Pfannenstiel skin incision was made with scalpel and carried through to the underlying layer of fascia. The fascia was incised in the midline and this incision was extended bilaterally bluntly. The rectus muscles were separated and the abdomen was entered bluntly. An Alexis retractor was placed to aid in visualization of the uterus.  Attention was turned to the lower uterine segment where a transverse hysterotomy was made with a scalpel and extended bilaterally bluntly. The infant was successfully delivered, and cord was clamped and cut and infant was handed over to awaiting neonatology team.  ? ?Uterine massage was then administered and the placenta delivered intact with three-vessel cord. The uterus was then cleared of clot and debris.  The hysterotomy was closed with 0 Vicryl in a running locked fashion, and an imbricating layer was also placed with a 0 Vicryl. Overall, excellent hemostasis was noted. The abdomen and the pelvis were cleared of all clot and debris and the Jon Gills was removed. Hemostasis was confirmed on all surfaces.  The peritoneum was reapproximated using 2-0 vicryl running stitches. The fascia was then closed using 0 Vicryl in a running fashion. The skin was closed with 4-0 vicryl. The patient tolerated the procedure well. Sponge, lap, instrument and needle counts were correct x 2. She was taken to the recovery room in stable condition.  ? ? ?Levie Heritage, DO ?08/29/2021 3:39 PM ? ?

## 2021-08-29 NOTE — Progress Notes (Signed)
Chaplain notified of MTP. Chaplain presented to pt's room to initiate contact with family and offer support. Pt's mother was in the room with baby True and older brother Elonda Husky. She shared that FOB went to get something to eat. Chaplain asked open ended questions to facilitate story telling and emotional expression. Pt's mother shared some feelings of anxiety as she awaits news of her daughter's surgery. She stated that we sometimes forget that all birth and surgery have associated risks and complications and she is trying to hold that tension. She is grateful for family and friends who are supporting from [place] and with prayer. She could not identify particular needs at this time. Chaplain normalized the worry of sudden health changes and how difficult it is to wait for while someone else cares for those most important to [place]. Chaplain will follow up with Karagan and her partner tomorrow to process the experience and provide additional support. ? ?Chaplain provided a normalizing experience for Elonda Husky who is a first time big brother. Chaplain gave him the opportunity to introduce his new brother and share a bit about him.  ? ?Please page as further needs arise. ? ?Donald Prose. Elyn Peers, M.Div. [facility] ?Chaplain ?Pager [phone] ?Office [phone] ? ?

## 2021-08-29 NOTE — Consult Note (Signed)
? ?NAME:  Lori Baldwin, MRN:  [id number], DOB:  Aug 06, 1995, LOS: 1 ?ADMISSION DATE:  08/28/2021, CONSULTATION DATE:  4/4 ?REFERRING MD:  Dr. Ronney Asters, CHIEF COMPLAINT:  Post op hemorrhage  ? ?History of Present Illness:  ?26 year old female who upon admission was G4P102 presented with intrauterine pregnancy at 22w0dwho presented for induction of labor. Pregnancy complicated gestational diabetes. She was initiated on pitocin and was given terbutaline due to persistent decelerations. Position changed and fetal heart tones improved. Patient ultimately decided to proceed with cesarean delivery. Surgery was without complications, however, shortly after she developed hypotension. She was reexamined and felt to have hemorrhage. Taken back to OR and immediately a large amount of hemoperitoneum was noted. Ultimately she required hysterectomy for hemostasis. Estimated 4L blood loss. She received 8 units PRBC, 4 units plasma, and 2 units of platelets. The patient was closed and transferred to medical ICU on the mechanical ventilator. PCCM consulted.  ? ?Pertinent  Medical History  ? has a past medical history of Anemia, BV (bacterial vaginosis), Gestational diabetes, Gonorrhea, Headache, Pharyngitis, and Yeast vaginitis. ? ? ?Significant Hospital Events: ?Including procedures, antibiotic start and stop dates in addition to other pertinent events   ? ? ?Interim History / Subjective:  ? ? ?Objective   ?Blood pressure 102/78, pulse (!) 107, temperature 97.6 ?F (36.4 ?C), temperature source Oral, resp. rate (!) 22, height 5' 4"  (1.626 m), weight 63 kg, last menstrual period 11/16/2020, SpO2 100 %, unknown if currently breastfeeding. ?   ?Vent Mode: PRVC ?FiO2 (%):  [50 %-100 %] 50 % ?Set Rate:  [22 bmp] 22 bmp ?Vt Set:  [440 mL] 440 mL ?PEEP:  [5 cmH20] 5 cmH20 ?Plateau Pressure:  [17 cmH20] 17 cmH20  ? ?Intake/Output Summary (Last 24 hours) at 08/29/2021 1648 ?Last data filed at 08/29/2021 1544 ?Gross per 24 hour  ?Intake 7036 ml   ?Output 5997 ml  ?Net 1039 ml  ? ?Filed Weights  ? 08/28/21 07619 ?Weight: 63 kg  ? ? ?Examination: ?General: Young adult female on vent ?HENT: [place]/AT, PERRL, no JVD ?Lungs: Clear bilateral vent assisted breaths.  ?Cardiovascular: RRR, no MRG ?Abdomen: Soft, non-distended, surgical dressing in place with minimal oozing.  ?Extremities: No acute deformity ?Neuro: Sedated. RASS -4 ?GU: Foley ? ?Resolved Hospital Problem list   ? ? ?Assessment & Plan:  ? ?Hemorrhagic shock: s/p cesarean delivery. Taken back to OR and ultimately required hysterectomy. Received 8 units PRBC, 4 FFP, and 2 platelets.  Required pressors briefly in OR. Upon arrival to ICU pressors are off.  ?- ICU hemodynamic monitoring ?- H&H now and q 6 hours.  Transfuse for hemoglobin. ?- Giving additional platelets based on TEG ?- Give calcium ?- No further crystalloid ? ?Endotracheal tube present ?- ABG reviewed and settings adjusted ?- CXR pending ?- Propofol infusion and PRN fentanyl for RASS goal -1 to -2 ?- SAT, SBT tomorrow morning. Hopeful she will extubate.  ? ?Post partum: s/p urgent cesarean delivery 4/4 ?- Management per OB ?- Epidural in place. Anesthesia planning to remove 4/5 ? ?GDM ?- CBG monitoring and SSI ? ? ? ?Best Practice (right click and "Reselect all SmartList Selections" daily)  ? ?Diet/type: NPO ?DVT prophylaxis: not indicated ?GI prophylaxis: PPI ?Lines: Central line and Arterial Line ?Foley:  Yes, and it is still needed ?Code Status:  full code ?Last date of multidisciplinary goals of care discussion [ ]  ? ?Labs   ?CBC: ?Recent Labs  ?Lab 08/28/21 ?0509304/04/23 ?1332  ?  WBC 7.8 11.8*  ?HGB 11.3* 5.9*  ?HCT 34.0* 17.4*  ?MCV 84.4 87.0  ?PLT 127* 28*  28*  ? ? ?Basic Metabolic Panel: ?Recent Labs  ?Lab 08/29/21 ?1335  ?NA 136  ?K 4.6  ?CL 106  ?CO2 20*  ?GLUCOSE 198*  ?BUN 8  ?CREATININE 0.76  ?CALCIUM 7.2*  ? ?GFR: ?Estimated Creatinine Clearance: 92 mL/min (by C-G formula based on SCr of 0.76 mg/dL). ?Recent Labs  ?Lab  08/28/21 ?1540 08/29/21 ?1332  ?WBC 7.8 11.8*  ? ? ?Liver Function Tests: ?Recent Labs  ?Lab 08/29/21 ?1335  ?AST 19  ?ALT 10  ?ALKPHOS 106  ?BILITOT 0.5  ?PROT <3.0*  ?ALBUMIN 1.7*  ? ?No results for input(s): LIPASE, AMYLASE in the last 168 hours. ?No results for input(s): AMMONIA in the last 168 hours. ? ?ABG ?No results found for: PHART, PCO2ART, PO2ART, HCO3, TCO2, ACIDBASEDEF, O2SAT  ? ?Coagulation Profile: ?Recent Labs  ?Lab 08/29/21 ?1332  ?INR 1.5*  ? ? ?Cardiac Enzymes: ?No results for input(s): CKTOTAL, CKMB, CKMBINDEX, TROPONINI in the last 168 hours. ? ?HbA1C: ?Hgb A1c MFr Bld  ?Date/Time Value Ref Range Status  ?02/22/2021 03:56 PM 5.2 4.8 - 5.6 % Final  ?  Comment:  ?           Prediabetes: 5.7 - 6.4 ?         Diabetes: >6.4 ?         Glycemic control for adults with diabetes: <7.0 ?  ? ? ?CBG: ?Recent Labs  ?Lab 08/28/21 ?1749 08/28/21 ?1816 08/28/21 ?1854 08/28/21 ?2321 08/29/21 ?0867  ?GLUCAP 57* 58* 106* 75 98  ? ? ?Review of Systems:   ?Patient is encephalopathic and/or intubated. Therefore history has been obtained from chart review.  ? ? ?Past Medical History:  ?She,  has a past medical history of Anemia, BV (bacterial vaginosis), Gestational diabetes, Gonorrhea, Headache, Pharyngitis, and Yeast vaginitis.  ? ?Surgical History:  ? ?Past Surgical History:  ?Procedure Laterality Date  ? CESAREAN SECTION    ?  ? ?Social History:  ? reports that she has never smoked. She has never used smokeless tobacco. She reports that she does not currently use drugs after having used the following drugs: Marijuana. She reports that she does not drink alcohol.  ? ?Family History:  ?Her family history includes Cancer in her maternal grandmother; Diabetes in her maternal grandfather; Hypertension in her mother; Sickle cell anemia in her paternal grandmother.  ? ?Allergies ?Allergies  ?Allergen Reactions  ? Aspirin Itching  ?  Pt states she does fine when she takes ibuprofen  ?  ?  ? ?Home Medications  ?Prior to  Admission medications   ?Medication Sig Start Date End Date Taking? Authorizing Provider  ?Accu-Chek Softclix Lancets lancets Use four times daily as instructed. 06/19/21   Anyanwu, Sallyanne Havers, MD  ?Blood Glucose Monitoring Suppl (ACCU-CHEK GUIDE) w/Device KIT USE AS DIRECTED IN THE MORNING, AT NOON, IN THE EVENING, AND AT BEDTIME 07/10/21   Anyanwu, Sallyanne Havers, MD  ?Blood Pressure Monitoring DEVI 1 each by Does not apply route once a week. 02/01/21   Aletha Halim, MD  ?glucose blood (ACCU-CHEK GUIDE) test strip 1 each by Other route 4 (four) times daily. Use as instructed; check blood glucose 4 times daily 08/18/21   Renard Matter, MD  ?Misc. Devices (GOJJI WEIGHT SCALE) MISC 1 Device by Does not apply route as needed. 02/22/21   Donnamae Jude, MD  ?Prenatal Vit-Fe Fumarate-FA (PREPLUS) 27-1 MG TABS Take  1 tablet by mouth daily. 06/07/21   Caren Macadam, MD  ?promethazine (PHENERGAN) 25 MG tablet Take 1 tablet (25 mg total) by mouth every 6 (six) hours as needed for nausea or vomiting. 08/18/21   Constant, Vickii Chafe, MD  ?promethazine (PHENERGAN) 6.25 MG/5ML syrup Take 10 mLs (12.5 mg total) by mouth every 6 (six) hours as needed for nausea or vomiting. 08/18/21   Constant, Vickii Chafe, MD  ?  ? ?Critical care time: 39 minutes ?  ? ? ?Georgann Housekeeper, AGACNP-BC ?[facility] ? ?See Amion for personal pager ?PCCM on call pager [phone] until 7pm. ?Please call Elink 7p-7a. [phone] ? ?08/29/2021 5:35 PM ? ? ? ? ? ? ? ?

## 2021-08-30 DIAGNOSIS — O0993 Supervision of high risk pregnancy, unspecified, third trimester: Secondary | ICD-10-CM | POA: Diagnosis not present

## 2021-08-30 LAB — CBC
HCT: 18.7 % — ABNORMAL LOW (ref 36.0–46.0)
HCT: 20.3 % — ABNORMAL LOW (ref 36.0–46.0)
HCT: 24.8 % — ABNORMAL LOW (ref 36.0–46.0)
Hemoglobin: 6.9 g/dL — CL (ref 12.0–15.0)
Hemoglobin: 7.1 g/dL — ABNORMAL LOW (ref 12.0–15.0)
Hemoglobin: 8.7 g/dL — ABNORMAL LOW (ref 12.0–15.0)
MCH: 30.8 pg (ref 26.0–34.0)
MCH: 30.9 pg (ref 26.0–34.0)
MCH: 30.5 pg (ref 26.0–34.0)
MCHC: 36.9 g/dL — ABNORMAL HIGH (ref 30.0–36.0)
MCHC: 35 g/dL (ref 30.0–36.0)
MCHC: 35.1 g/dL (ref 30.0–36.0)
MCV: 83.5 fL (ref 80.0–100.0)
MCV: 88.3 fL (ref 80.0–100.0)
MCV: 87 fL (ref 80.0–100.0)
Platelets: UNDETERMINED 10*3/uL (ref 150–400)
Platelets: 112 10*3/uL — ABNORMAL LOW (ref 150–400)
Platelets: 149 10*3/uL — ABNORMAL LOW (ref 150–400)
RBC: 2.24 MIL/uL — ABNORMAL LOW (ref 3.87–5.11)
RBC: 2.3 MIL/uL — ABNORMAL LOW (ref 3.87–5.11)
RBC: 2.85 MIL/uL — ABNORMAL LOW (ref 3.87–5.11)
RDW: 14.2 % (ref 11.5–15.5)
RDW: 14.8 % (ref 11.5–15.5)
RDW: 14.6 % (ref 11.5–15.5)
WBC: 9.7 10*3/uL (ref 4.0–10.5)
WBC: 12.5 10*3/uL — ABNORMAL HIGH (ref 4.0–10.5)
WBC: 17.4 10*3/uL — ABNORMAL HIGH (ref 4.0–10.5)
nRBC: 0.7 % — ABNORMAL HIGH (ref 0.0–0.2)
nRBC: 0.6 % — ABNORMAL HIGH (ref 0.0–0.2)
nRBC: 0.9 % — ABNORMAL HIGH (ref 0.0–0.2)

## 2021-08-30 LAB — PREPARE PLATELET PHERESIS
Unit division: 0
Unit division: 0
Unit division: 0
Unit division: 0

## 2021-08-30 LAB — BPAM PLATELET PHERESIS
Blood Product Expiration Date: 202304052359
Blood Product Expiration Date: 202304062359
Blood Product Expiration Date: 202304062359
Blood Product Expiration Date: 202304072359
ISSUE DATE / TIME: 202304041332
ISSUE DATE / TIME: 202304041436
ISSUE DATE / TIME: 202304041759
ISSUE DATE / TIME: 202304041759
Unit Type and Rh: 5100
Unit Type and Rh: 6200
Unit Type and Rh: 6200
Unit Type and Rh: 7300

## 2021-08-30 LAB — POCT I-STAT EG7
Acid-base deficit: 6 mmol/L — ABNORMAL HIGH (ref 0.0–2.0)
Bicarbonate: 19.2 mmol/L — ABNORMAL LOW (ref 20.0–28.0)
Calcium, Ion: 0.8 mmol/L — CL (ref 1.15–1.40)
HCT: 21 % — ABNORMAL LOW (ref 36.0–46.0)
Hemoglobin: 7.1 g/dL — ABNORMAL LOW (ref 12.0–15.0)
O2 Saturation: 100 %
Potassium: 5.2 mmol/L — ABNORMAL HIGH (ref 3.5–5.1)
Sodium: 136 mmol/L (ref 135–145)
TCO2: 20 mmol/L — ABNORMAL LOW (ref 22–32)
pCO2, Ven: 37.8 mmHg — ABNORMAL LOW (ref 44–60)
pH, Ven: 7.313 (ref 7.25–7.43)
pO2, Ven: 290 mmHg — ABNORMAL HIGH (ref 32–45)

## 2021-08-30 LAB — BASIC METABOLIC PANEL
Anion gap: 5 (ref 5–15)
BUN: 6 mg/dL (ref 6–20)
CO2: 24 mmol/L (ref 22–32)
Calcium: 7.8 mg/dL — ABNORMAL LOW (ref 8.9–10.3)
Chloride: 107 mmol/L (ref 98–111)
Creatinine, Ser: 0.61 mg/dL (ref 0.44–1.00)
GFR, Estimated: >60 mL/min (ref >60)
Glucose, Bld: 114 mg/dL — ABNORMAL HIGH (ref 70–99)
Potassium: 3.3 mmol/L — ABNORMAL LOW (ref 3.5–5.1)
Sodium: 136 mmol/L (ref 135–145)

## 2021-08-30 LAB — PROTIME-INR
INR: 1.2 (ref 0.8–1.2)
INR: 1.1 (ref 0.8–1.2)
Prothrombin Time: 14.7 seconds (ref 11.4–15.2)
Prothrombin Time: 13.8 seconds (ref 11.4–15.2)

## 2021-08-30 LAB — PREPARE CRYOPRECIPITATE
Unit division: 0
Unit division: 0

## 2021-08-30 LAB — BPAM CRYOPRECIPITATE
Blood Product Expiration Date: 202304042015
Blood Product Expiration Date: 202304042015
ISSUE DATE / TIME: 202304041433
ISSUE DATE / TIME: 202304041433
Unit Type and Rh: 6200
Unit Type and Rh: 6200

## 2021-08-30 LAB — PHOSPHORUS: Phosphorus: 3.8 mg/dL (ref 2.5–4.6)

## 2021-08-30 LAB — PREPARE RBC (CROSSMATCH)

## 2021-08-30 LAB — MAGNESIUM
Magnesium: 1.2 mg/dL — ABNORMAL LOW (ref 1.7–2.4)
Magnesium: 2 mg/dL (ref 1.7–2.4)

## 2021-08-30 LAB — GLUCOSE, CAPILLARY
Glucose-Capillary: 111 mg/dL — ABNORMAL HIGH (ref 70–99)
Glucose-Capillary: 104 mg/dL — ABNORMAL HIGH (ref 70–99)

## 2021-08-30 LAB — LACTIC ACID, PLASMA: Lactic Acid, Venous: 1.3 mmol/L (ref 0.5–1.9)

## 2021-08-30 LAB — TRIGLYCERIDES: Triglycerides: 190 mg/dL — ABNORMAL HIGH (ref <150)

## 2021-08-30 MED ORDER — SODIUM CHLORIDE 0.9% IV SOLUTION
Freq: Once | INTRAVENOUS | Status: AC
  Administered 2021-08-30: 10 mL via INTRAVENOUS

## 2021-08-30 MED ORDER — MAGNESIUM SULFATE 4 GM/100ML IV SOLN
4.0000 g | Freq: Once | INTRAVENOUS | Status: AC
  Administered 2021-08-30: 4 g via INTRAVENOUS
  Filled 2021-08-30: qty 100

## 2021-08-30 MED ORDER — OXYCODONE HCL 5 MG PO TABS
5.0000 mg | ORAL_TABLET | Freq: Four times a day (QID) | ORAL | Status: DC | PRN
  Administered 2021-08-31: 5 mg via ORAL
  Filled 2021-08-30: qty 1

## 2021-08-30 MED ORDER — ACETAMINOPHEN 500 MG PO TABS
1000.0000 mg | ORAL_TABLET | Freq: Four times a day (QID) | ORAL | Status: AC
  Administered 2021-08-30 (×2): 1000 mg
  Filled 2021-08-30 (×2): qty 2

## 2021-08-30 MED ORDER — POTASSIUM CHLORIDE 10 MEQ/50ML IV SOLN
10.0000 meq | INTRAVENOUS | Status: AC
  Administered 2021-08-30 (×4): 10 meq via INTRAVENOUS
  Filled 2021-08-30 (×4): qty 50

## 2021-08-30 MED ORDER — ACETAMINOPHEN 325 MG PO TABS
650.0000 mg | ORAL_TABLET | Freq: Four times a day (QID) | ORAL | Status: DC | PRN
  Administered 2021-08-30 – 2021-09-02 (×6): 650 mg via ORAL
  Filled 2021-08-30 (×6): qty 2

## 2021-08-30 MED ORDER — CHLORHEXIDINE GLUCONATE CLOTH 2 % EX PADS: 6.0000 | MEDICATED_PAD | Freq: Every day | CUTANEOUS | Status: DC

## 2021-08-30 MED ORDER — MAGNESIUM SULFATE 2 GM/50ML IV SOLN
2.0000 g | Freq: Once | INTRAVENOUS | Status: AC
  Administered 2021-08-30: 2 g via INTRAVENOUS
  Filled 2021-08-30: qty 50

## 2021-08-30 MED ORDER — OXYCODONE-ACETAMINOPHEN 5-325 MG PO TABS
1.0000 | ORAL_TABLET | ORAL | Status: DC | PRN
  Administered 2021-08-30: 1 via ORAL
  Filled 2021-08-30: qty 1

## 2021-08-30 MED ORDER — POTASSIUM CHLORIDE 20 MEQ PO PACK
20.0000 meq | PACK | ORAL | Status: AC
  Administered 2021-08-30 (×2): 20 meq
  Filled 2021-08-30 (×2): qty 1

## 2021-08-30 MED ORDER — OXYCODONE-ACETAMINOPHEN 7.5-325 MG PO TABS
1.0000 | ORAL_TABLET | ORAL | Status: DC | PRN
  Administered 2021-08-30: 1 via ORAL
  Filled 2021-08-30: qty 1

## 2021-08-30 NOTE — Progress Notes (Signed)
Patient ID: Lori Baldwin, female   DOB: 1996-01-14, 26 y.o.   MRN: [id number] ? ? ?Assessment/Plan: ?Principal Problem: ?  Encounter for supervision of high risk pregnancy in third trimester, antepartum ?Active Problems: ?  Supervision of high-risk pregnancy ?  Status post repeat low transverse cesarean section ?  Hemorrhagic shock (HCC) ?  S/P cesarean section ?  Endotracheal tube present ? ?Status post cesarean hysterectomy ?Would not be shocked if she develops fluid in her abdomen and ileus related to extensive surgery and blood products in the peritoneal cavity. ?Out of bed as able ?We will need Lovenox once stability of hemoglobin is noted. ?Lactation consultation and breast pumping to begin ? ?Hemorrhagic shock ?May wean pressors, sedation and ventilation as appropriate ? ?Coagulopathy ?Secondary to hemorrhagic shock this has been corrected ? ?Acute blood loss anemia ?Status post multiple units of packed red blood cells  ?Remains anemic, most recent hemoglobin is 6.9 status post 1 unit ?Trend CBC ? ?Subjective: ?Interval History: Patient feels much better today she is awake and alert her ET tube remains in place but there is talk of removing this today ? ?Objective: ?Vital signs in last 24 hours: ?Temp:  [97 ?F (36.1 ?C)-98.5 ?F (36.9 ?C)] 98.5 ?F (36.9 ?C) (04/05 0755) ?Pulse Rate:  [66-123] 83 (04/05 0755) ?Resp:  [6-26] 18 (04/05 0800) ?BP: (82-118)/(49-82) 87/57 (04/05 0800) ?SpO2:  [100 %] 100 % (04/05 0800) ?Arterial Line BP: (69-141)/(40-74) 105/55 (04/05 0800) ?FiO2 (%):  [30 %-100 %] 30 % (04/05 0800) ?Weight:  [63.2 kg] 63.2 kg (04/05 0500) ? ?Intake/Output from previous day: ?04/04 0701 - 04/05 0700 ?In: 13034.1 [I.V.:7487.3] ?Out: 7247 [Urine:1850] ?Intake/Output this shift: ?Total I/O ?In: 499 [I.V.:29.3; Blood:382; NG/GT:60; IV Piggyback:27.7] ?Out: 50 [Urine:50] ? ?General appearance: alert, cooperative, appears stated age, and no distress ?Head: Normocephalic, without obvious abnormality,  atraumatic ?Neck: supple, symmetrical, trachea midline and right IJ in place ?Lungs: Normal effort ?Heart: regular rate and rhythm ?Abdomen: Soft, distended ?Extremities: Homans sign is negative, no sign of DVT ?Skin: Skin color, texture, turgor normal. No rashes or lesions ?Neurologic: Grossly normal ? ?Results for orders placed or performed during the hospital encounter of 08/28/21 (from the past 24 hour(s))  ?Prepare RBC (crossmatch)     Status: None  ? Collection Time: 08/29/21 12:21 PM  ?Result Value Ref Range  ? Order Confirmation    ?  ORDER PROCESSED BY BLOOD BANK ?Performed at [facility], [address] [place], [place] [address] ?  ?Initiate MTP (Blood Bank Notification)     Status: None  ? Collection Time: 08/29/21 12:22 PM  ?Result Value Ref Range  ? Initiate Massive Transfusion Protocol    ?  MTP ORDER RECEIVED ?Performed at [facility], [address] [place], [place] [address] ?  ?Prepare RBC (crossmatch)     Status: None  ? Collection Time: 08/29/21  1:22 PM  ?Result Value Ref Range  ? Order Confirmation    ?  ORDER PROCESSED BY BLOOD BANK ?Performed at [facility], [address] [place], [place] [address] ?  ?Prepare platelet pheresis     Status: None (Preliminary result)  ? Collection Time: 08/29/21  1:22 PM  ?Result Value Ref Range  ? Unit Number [id number]   ? Blood Component Type PLTP1 PSORALEN TREATED   ? Unit division 00   ? Status of Unit ISSUED   ? Transfusion Status OK TO TRANSFUSE   ? Unit Number [id number]   ?  Blood Component Type PLTP1 PSORALEN TREATED   ? Unit division 00   ? Status of Unit ISSUED   ? Transfusion Status    ?  OK TO TRANSFUSE ?Performed at [facility], [address], [place], [place] [address] ?  ?DIC Panel ONCE - STAT     Status: Abnormal  ? Collection Time: 08/29/21  1:32 PM  ?Result Value Ref Range  ? Prothrombin Time 18.3 (H) 11.4 - 15.2 seconds  ? INR 1.5 (H) 0.8 - 1.2  ? aPTT 39 (H) 24 - 36 seconds  ? Fibrinogen 195 (L) 210 -  475 mg/dL  ? D-Dimer, Quant 3.62 (H) 0.00 - 0.50 ug/mL-FEU  ? Platelets 28 (LL) 150 - 400 K/uL  ? Smear Review NO SCHISTOCYTES SEEN   ?CBC     Status: Abnormal  ? Collection Time: 08/29/21  1:32 PM  ?Result Value Ref Range  ? WBC 11.8 (H) 4.0 - 10.5 K/uL  ? RBC 2.00 (L) 3.87 - 5.11 MIL/uL  ? Hemoglobin 5.9 (LL) 12.0 - 15.0 g/dL  ? HCT 17.4 (L) 36.0 - 46.0 %  ? MCV 87.0 80.0 - 100.0 fL  ? MCH 29.5 26.0 - 34.0 pg  ? MCHC 33.9 30.0 - 36.0 g/dL  ? RDW 15.9 (H) 11.5 - 15.5 %  ? Platelets 28 (LL) 150 - 400 K/uL  ? nRBC 0.3 (H) 0.0 - 0.2 %  ?Comprehensive metabolic panel     Status: Abnormal  ? Collection Time: 08/29/21  1:35 PM  ?Result Value Ref Range  ? Sodium 136 135 - 145 mmol/L  ? Potassium 4.6 3.5 - 5.1 mmol/L  ? Chloride 106 98 - 111 mmol/L  ? CO2 20 (L) 22 - 32 mmol/L  ? Glucose, Bld 198 (H) 70 - 99 mg/dL  ? BUN 8 6 - 20 mg/dL  ? Creatinine, Ser 0.76 0.44 - 1.00 mg/dL  ? Calcium 7.2 (L) 8.9 - 10.3 mg/dL  ? Total Protein <3.0 (L) 6.5 - 8.1 g/dL  ? Albumin 1.7 (L) 3.5 - 5.0 g/dL  ? AST 19 15 - 41 U/L  ? ALT 10 0 - 44 U/L  ? Alkaline Phosphatase 106 38 - 126 U/L  ? Total Bilirubin 0.5 0.3 - 1.2 mg/dL  ? GFR, Estimated >60 >60 mL/min  ? Anion gap 10 5 - 15  ?Prepare cryoprecipitate     Status: None (Preliminary result)  ? Collection Time: 08/29/21  2:11 PM  ?Result Value Ref Range  ? Unit Number [id number]   ? Blood Component Type CRYPOOL THAW   ? Unit division 00   ? Status of Unit DISCARDED   ? Transfusion Status    ?  OK TO TRANSFUSE ?Performed at [facility], [address], [place], [place] [address] ?  ? Unit Number [id number]   ? Blood Component Type CRYPOOL THAW   ? Unit division 00   ? Status of Unit ISSUED   ? Transfusion Status OK TO TRANSFUSE   ?Global TEG Panel     Status: Abnormal  ? Collection Time: 08/29/21  2:50 PM  ?Result Value Ref Range  ? Citrated Kaolin (R) 6.1 4.6 - 9.1 min  ? Citrated Kaolin (K) 2.8 (H) 0.8 - 2.1 min  ? Citrated Kaolin Angle 62.6 (L) 63 - 78 deg  ? Citrated Kaolin  (MA) 43.2 (L) 52 - 69 mm  ? Citrated Rapid TEG (MA) 41.0 (L) 52 - 70 mm  ? CK with Heparinase (R)  5.4 4.3 - 8.3 min  ? CFF Max Amplitude 9.4 (L) 15 - 32 mm  ? Citrated Functional Fibrinogen 171.5 (L) 278 - 581 mg/dL  ?MRSA Next Gen by PCR, Nasal     Status: None  ? Collection Time: 08/29/21  3:59 PM  ? Specimen: Nasal Mucosa; Nasal Swab  ?Result Value Ref Range  ? MRSA by PCR Next Gen NOT DETECTED NOT DETECTED  ?Comprehensive metabolic panel     Status: Abnormal  ? Collection Time: 08/29/21  5:00 PM  ?Result Value Ref Range  ? Sodium 135 135 - 145 mmol/L  ? Potassium 4.0 3.5 - 5.1 mmol/L  ? Chloride 109 98 - 111 mmol/L  ? CO2 20 (L) 22 - 32 mmol/L  ? Glucose, Bld 132 (H) 70 - 99 mg/dL  ? BUN 8 6 - 20 mg/dL  ? Creatinine, Ser 0.62 0.44 - 1.00 mg/dL  ? Calcium 7.7 (L) 8.9 - 10.3 mg/dL  ? Total Protein 3.3 (L) 6.5 - 8.1 g/dL  ? Albumin 1.8 (L) 3.5 - 5.0 g/dL  ? AST 22 15 - 41 U/L  ? ALT 14 0 - 44 U/L  ? Alkaline Phosphatase 75 38 - 126 U/L  ? Total Bilirubin 0.6 0.3 - 1.2 mg/dL  ? GFR, Estimated >60 >60 mL/min  ? Anion gap 6 5 - 15  ?Fibrinogen     Status: None  ? Collection Time: 08/29/21  5:00 PM  ?Result Value Ref Range  ? Fibrinogen 271 210 - 475 mg/dL  ?Magnesium     Status: Abnormal  ? Collection Time: 08/29/21  5:00 PM  ?Result Value Ref Range  ? Magnesium 1.1 (L) 1.7 - 2.4 mg/dL  ?Phosphorus     Status: None  ? Collection Time: 08/29/21  5:00 PM  ?Result Value Ref Range  ? Phosphorus 3.9 2.5 - 4.6 mg/dL  ?I-STAT 7, (LYTES, BLD GAS, ICA, H+H)     Status: Abnormal  ? Collection Time: 08/29/21  5:06 PM  ?Result Value Ref Range  ? pH, Arterial 7.487 (H) 7.35 - 7.45  ? pCO2 arterial 26.7 (L) 32 - 48 mmHg  ? pO2, Arterial 239 (H) 83 - 108 mmHg  ? Bicarbonate 20.3 20.0 - 28.0 mmol/L  ? TCO2 21 (L) 22 - 32 mmol/L  ? O2 Saturation 100 %  ? Acid-base deficit 3.0 (H) 0.0 - 2.0 mmol/L  ? Sodium 137 135 - 145 mmol/L  ? Potassium 4.1 3.5 - 5.1 mmol/L  ? Calcium, Ion 1.08 (L) 1.15 - 1.40 mmol/L  ? HCT 25.0 (L) 36.0 - 46.0 %   ? Hemoglobin 8.5 (L) 12.0 - 15.0 g/dL  ? Patient temperature 97.4 F   ? Collection site art line   ? Drawn by Operator   ? Sample type ARTERIAL   ?Lactic acid, plasma     Status: Abnormal  ? Collection T

## 2021-08-30 NOTE — Progress Notes (Signed)
RT note. ?Patient placed on SBT 8/5 30% sat 100, HR93, RR 14. No labored breathing vt 400-500. RT will continue to monitor.  ?

## 2021-08-30 NOTE — Progress Notes (Signed)
[facility] ADULT ICU REPLACEMENT PROTOCOL ? ? ?The patient does apply for the [facility] Adult ICU Electrolyte Replacment Protocol based on the criteria listed below:  ? ?1.Exclusion criteria: TCTS patients, ECMO patients, and Dialysis patients ?2. Is GFR >/= 30 ml/min? Yes.    ?Patient's GFR today is >60 ?3. Is SCr </= 2? Yes.   ?Patient's SCr is 0.61 mg/dL ?4. Did SCr increase >/= 0.5 in 24 hours? No. ?5.Pt's weight >40kg  Yes.   ?6. Abnormal electrolyte(s): K 3.3, Mg 1.2  ?7. Electrolytes replaced per protocol ? ? ?Christeen Douglas 08/30/2021 7:02 AM  ?

## 2021-08-30 NOTE — Lactation Note (Signed)
This note was copied from a baby's chart. ?Lactation Consultation Note ? ?Patient Name: Boy [facility] ?Today's Date: 08/30/2021 ?  ?Age:27 hours ? ?LC Note: ? ?Mother is currently intubated and in ICU; hoping to extubate and return to the M/B unit later today.  Baby is in the NBN. ? ? ? ?Maternal Data ?  ? ?Feeding ?Nipple Type: Nfant Standard Flow (white) ? ?LATCH Score ?  ? ?  ? ?  ? ?  ? ?  ? ?  ? ? ?Lactation Tools Discussed/Used ?  ? ?Interventions ?  ? ?Discharge ?  ? ?Consult Status ?  ? ? ? ?Kianni Lheureux R Rukiya Hodgkins ?08/30/2021, 2:35 PM ? ? ? ?

## 2021-08-30 NOTE — Progress Notes (Signed)
eLink Physician-Brief Progress Note ?Patient Name: Lori Baldwin [facility] ?DOB: [birth year] ?MRN: [id number] ? ? ?Date of Service ? 08/30/2021  ?HPI/Events of Note ? Anemia - Hgb = 6.9.  ?eICU Interventions ? Will transfuse 1 unit PRBC.   ? ? ? ?Intervention Category ?Major Interventions: Other: ? ?Lori Baldwin ?08/30/2021, 6:01 AM ?

## 2021-08-30 NOTE — Lactation Note (Addendum)
This note was copied from a baby's chart. ?Lactation Consultation Note ? ?Patient Name: Boy [facility] ?Today's Date: 08/30/2021 ?Reason for consult: Initial assessment;Term (See mom's medical record, C/S delivery mom transferred from ICU.) ?Age:26 years, term female infant . ?Mom's feeding choice is breast and formula feeding. ?Per mom, she used DEBP once and she did see colostrum in breast flange, LC advised mom to pump every 3 hours for 15 minutes on initial setting to help stimulate milk supply.  ?Per mom, she is in a lot of pain, and plans to  start pump in morning, she doesn't want to latch infant at the breast tonight. ?LC informed mom she heard her concerns and that [facility] services can follow up with her in the morning. ?Infant is currently consuming 30 mls of formula per feeding.  ?Maternal Data ?Has patient been taught Hand Expression?: Yes ?Does the patient have breastfeeding experience prior to this delivery?: No ? ?Feeding ?Mother's Current Feeding Choice: Breast Milk and Formula ? ?LATCH Score ?  ? ?  ? ?  ? ?  ? ?  ? ?  ? ? ?Lactation Tools Discussed/Used ?Tools: Pump ?Breast pump type: Double-Electric Breast Pump ?Pump Education: Setup, frequency, and cleaning;Milk Storage ?Reason for Pumping: Mom not latched infant at breast and to help her milk supply. Pump was set up by RN. ?Pumping frequency: Mom knows to pump every 3 hours for 15 minutes on inital setting. ? ?Interventions ?Interventions: Breast feeding basics reviewed;Skin to skin;DEBP;Education;LC Services brochure ? ?Discharge ?  ? ?Consult Status ?Consult Status: Follow-up ?Date: 08/31/21 ?Follow-up type: In-patient ? ? ? ?Danelle Earthly ?08/30/2021, 10:44 PM ? ? ? ?

## 2021-08-30 NOTE — Progress Notes (Signed)
Patient frequently gesturing that she wants her baby. Call placed to mother baby unit to request that the baby come visit. Coordinated visit with baby's nurse. Mom able to hold baby and do skin to skin for about 15 minutes.  ? ?[name], RN ? ?

## 2021-08-30 NOTE — Progress Notes (Signed)
? ?NAME:  Lori Baldwin, MRN:  [id number], DOB:  1995/10/05, LOS: 2 ?ADMISSION DATE:  08/28/2021, CONSULTATION DATE:  4/4 ?REFERRING MD: Dr. Ronney Asters, CHIEF COMPLAINT:  Post op hemorrhage   ? ?History of Present Illness:  ? ?Lori Baldwin is a 26 yo [id number] presented with IUP at 74w0dfor IOL/TOLAC. Pregnancy complicated by gestational diabetes (Diet controlled).  She was initiated on pitocin and was given terbutaline due to persistent decelerations. Position changed and fetal heart tones improved. Patient ultimately decided to proceed with cesarean delivery. Surgery was without complications, however, shortly after she developed hypotension. She was reexamined and felt to have hemorrhage. Taken back to OR and immediately a large amount of hemoperitoneum was noted. Ultimately she required hysterectomy for hemostasis. Estimated 5L blood loss. She received 8 units PRBC, 4 units plasma, and 2 units of platelets. The patient was closed and transferred to medical ICU on the mechanical ventilator. PCCM consulted.  ? ? ?Pertinent  Medical History  ?A1GDM, Anemia, BV, Gestational diabetes, Gonorrhea, Headache, Pharyngitis, and Yeast vaginitis. ? ? ?Interim History / Subjective:  ? ?Overnight got a little anxious when propofol decreased  ?This morning hgb 6.9 and she received 1u pRBC  ?Still on Levophed and propofol gtts  ? ?Objective   ?Blood pressure 103/69, pulse (!) 117, temperature 98.4 ?F (36.9 ?C), temperature source Oral, resp. rate 18, height _0  (1.626 m), weight 63.2 kg, last menstrual period 11/16/2020, SpO2 100 %, unknown if currently breastfeeding. ?   ?Vent Mode: PRVC ?FiO2 (%):  [30 %-100 %] 30 % ?Set Rate:  [18 bmp-22 bmp] 18 bmp ?Vt Set:  [440 mL] 440 mL ?PEEP:  [5 cmH20] 5 cmH20 ?Plateau Pressure:  [17 cmH20] 17 cmH20  ? ?Intake/Output Summary (Last 24 hours) at 08/30/2021 0709 ?Last data filed at 08/30/2021 0654 ?Gross per 24 hour  ?Intake 12989.1 ml  ?Output 7247 ml  ?Net 5742.1 ml  ? ?Filed Weights  ?  08/28/21 0822 08/30/21 0500  ?Weight: 63 kg 63.2 kg  ? ? ?Examination: ?General: Young adult on vent, eyes closed ?HENT: NCAT EOMI ?Lungs: CTAB on Vent  ?Cardiovascular: RRR no murmurs  ?Abdomen: soft, Surgical dressing in place without visible drainage  ?Extremities: No deformities  ?Neuro: Responsive to voice and able to follow commands  ?GU: Foley  ? ? ?Assessment & Plan:  ? ?Hemorrhagic shock due to uterine bleeding ?Acute blood loss anemia ?S/p cesarean delivery. Required hysterectomy. Received 9u pRBC, 4u FFP, 2u platelets.  ?TEG panel indicating more of hypocoagulable state  ?Still requiring pressors. BP has ranged from 82-118/ 48-82, currently 87/57 ?- hemodynamic monitoring ?- monitor CBC q 6 hrs  ?- d/c Lovenox  ?- Tylenol 10042mq 6h ?- f/u  H&H  ?- Fentanyl prn for pain  ?- d/c Versed, propofol  ? ?Acute postop respiratory insufficiency  ?Endotracheal tube in place ?CXR with endotracheal tube tib 1.5cm above carina. Slight blunting of the costophrenic ?angles bilaterally, cannot exclude trace pleural effusions ?Trig checked given propofol- elevated at 190.  ?- Will extubate today  ?- d/c Versed, propofol  ? ?Hypomagnesemia ?Mag 1.2 this am, will get 4g  ?- continue to monitor  ? ?Hypokalemia ?K+ 3.3 ?- replenish with 20 meq per tube  ?- continue to monitor  ? ?A1GDM ?AM CBG 104. Has not required insulin  ?- CBG monitoring and SSI  ? ?Best Practice (right click and "Reselect all SmartList Selections" daily)  ? ?Diet/type: NPO ?DVT prophylaxis: not indicated ?GI prophylaxis: PPI ?Lines: [employer]  line and Arterial Line ?Foley:  Yes, and it is still needed ?Code Status:  full code ? ? ?Labs   ?CBC: ?Recent Labs  ?Lab 08/28/21 ?5809 08/29/21 ?1332 08/29/21 ?1706 08/30/21 ?0442  ?WBC 7.8 11.8*  --  9.7  ?HGB 11.3* 5.9* 8.5* 6.9*  ?HCT 34.0* 17.4* 25.0* 18.7*  ?MCV 84.4 87.0  --  83.5  ?PLT 127* 28*  28*  --  PLATELET CLUMPS NOTED ON SMEAR, UNABLE TO ESTIMATE  ? ? ?Basic Metabolic Panel: ?Recent Labs  ?Lab  08/29/21 ?1335 08/29/21 ?1700 08/29/21 ?1706 08/30/21 ?0442  ?NA 136 135 137 136  ?K 4.6 4.0 4.1 3.3*  ?CL 106 109  --  107  ?CO2 20* 20*  --  24  ?GLUCOSE 198* 132*  --  114*  ?BUN 8 8  --  6  ?CREATININE 0.76 0.62  --  0.61  ?CALCIUM 7.2* 7.7*  --  7.8*  ?MG  --  1.1*  --  1.2*  ?PHOS  --  3.9  --  3.8  ? ?GFR: ?Estimated Creatinine Clearance: 92 mL/min (by C-G formula based on SCr of 0.61 mg/dL). ?Recent Labs  ?Lab 08/28/21 ?9833 08/29/21 ?1332 08/29/21 ?1711 08/30/21 ?8250 08/30/21 ?0443  ?WBC 7.8 11.8*  --  9.7  --   ?LATICACIDVEN  --   --  3.1*  --  1.3  ? ? ?Liver Function Tests: ?Recent Labs  ?Lab 08/29/21 ?1335 08/29/21 ?1700  ?AST 19 22  ?ALT 10 14  ?ALKPHOS 106 75  ?BILITOT 0.5 0.6  ?PROT <3.0* 3.3*  ?ALBUMIN 1.7* 1.8*  ? ?No results for input(s): LIPASE, AMYLASE in the last 168 hours. ?No results for input(s): AMMONIA in the last 168 hours. ? ?ABG ?   ?Component Value Date/Time  ? PHART 7.487 (H) 08/29/2021 1706  ? PCO2ART 26.7 (L) 08/29/2021 1706  ? PO2ART 239 (H) 08/29/2021 1706  ? HCO3 20.3 08/29/2021 1706  ? TCO2 21 (L) 08/29/2021 1706  ? ACIDBASEDEF 3.0 (H) 08/29/2021 1706  ? O2SAT 100 08/29/2021 1706  ?  ? ?Coagulation Profile: ?Recent Labs  ?Lab 08/29/21 ?1332 08/30/21 ?0443  ?INR 1.5* 1.2  ? ? ?Cardiac Enzymes: ?No results for input(s): CKTOTAL, CKMB, CKMBINDEX, TROPONINI in the last 168 hours. ? ?HbA1C: ?Hgb A1c MFr Bld  ?Date/Time Value Ref Range Status  ?02/22/2021 03:56 PM 5.2 4.8 - 5.6 % Final  ?  Comment:  ?           Prediabetes: 5.7 - 6.4 ?         Diabetes: >6.4 ?         Glycemic control for adults with diabetes: <7.0 ?  ? ? ?CBG: ?Recent Labs  ?Lab 08/28/21 ?1854 08/28/21 ?2321 08/29/21 ?0316 08/29/21 ?1935 08/30/21 ?0326  ?GLUCAP 106* 75 98 108* 111*  ? ? ?Past Medical History:  ?She,  has a past medical history of Anemia, BV (bacterial vaginosis), Gestational diabetes, Gonorrhea, Headache, Pharyngitis, and Yeast vaginitis.  ? ?Surgical History:  ? ?Past Surgical History:  ?Procedure  Laterality Date  ? CESAREAN SECTION    ?  ? ?Social History:  ? reports that she has never smoked. She has never used smokeless tobacco. She reports that she does not currently use drugs after having used the following drugs: Marijuana. She reports that she does not drink alcohol.  ? ?Family History:  ?Her family history includes Cancer in her maternal grandmother; Diabetes in her maternal grandfather; Hypertension in her mother; Sickle cell anemia in her  paternal grandmother.  ? ?Allergies ?Allergies  ?Allergen Reactions  ? Aspirin Itching  ?  Pt states she does fine when she takes ibuprofen  ?  ?  ? ?Home Medications  ?Prior to Admission medications   ?Medication Sig Start Date End Date Taking? Authorizing Provider  ?Accu-Chek Softclix Lancets lancets Use four times daily as instructed. 06/19/21   Anyanwu, Sallyanne Havers, MD  ?Blood Glucose Monitoring Suppl (ACCU-CHEK GUIDE) w/Device KIT USE AS DIRECTED IN THE MORNING, AT NOON, IN THE EVENING, AND AT BEDTIME 07/10/21   Anyanwu, Sallyanne Havers, MD  ?Blood Pressure Monitoring DEVI 1 each by Does not apply route once a week. 02/01/21   Aletha Halim, MD  ?glucose blood (ACCU-CHEK GUIDE) test strip 1 each by Other route 4 (four) times daily. Use as instructed; check blood glucose 4 times daily 08/18/21   Renard Matter, MD  ?Misc. Devices (GOJJI WEIGHT SCALE) MISC 1 Device by Does not apply route as needed. 02/22/21   Donnamae Jude, MD  ?Prenatal Vit-Fe Fumarate-FA (PREPLUS) 27-1 MG TABS Take 1 tablet by mouth daily. 06/07/21   Caren Macadam, MD  ?promethazine (PHENERGAN) 25 MG tablet Take 1 tablet (25 mg total) by mouth every 6 (six) hours as needed for nausea or vomiting. 08/18/21   Constant, Vickii Chafe, MD  ?promethazine (PHENERGAN) 6.25 MG/5ML syrup Take 10 mLs (12.5 mg total) by mouth every 6 (six) hours as needed for nausea or vomiting. 08/18/21   Constant, Vickii Chafe, MD  ?  ? ? ? ?  ?

## 2021-08-30 NOTE — Procedures (Addendum)
Extubation Procedure Note ? ?Patient Details:   ?Name: Perrin Eddleman [facility] ?DOB: 03-Jan-1996 ?MRN: [id number] ?  ?Airway Documentation:  ?  ?Vent end date: 08/30/21 Vent end time: 1006  ? ?Evaluation ? O2 sats: stable throughout ?Complications: No apparent complications ?Patient did tolerate procedure well. ?Bilateral Breath Sounds: Clear, Diminished ?  ?Yes ? ?Pt extubated to 3L [place] sats 100% with no labored breathing. Positive cuff leak, no stridor noted, Pt able to say name. RT will continue to monitor.  ?Patient instructed on IS, able to achieve . ?Rosaura Carpenter ?08/30/2021, 10:06 AM ? ?

## 2021-08-31 ENCOUNTER — Encounter (HOSPITAL_COMMUNITY): Payer: Self-pay | Admitting: Family Medicine

## 2021-08-31 ENCOUNTER — Other Ambulatory Visit: Payer: Self-pay

## 2021-08-31 LAB — BPAM FFP
Blood Product Expiration Date: 202304062359
Blood Product Expiration Date: 202304072359
Blood Product Expiration Date: 202304072359
Blood Product Expiration Date: 202304092359
Blood Product Expiration Date: 202304092359
Blood Product Expiration Date: 202304092359
Blood Product Expiration Date: 202304092359
Blood Product Expiration Date: 202304092359
Blood Product Expiration Date: 202304092359
Blood Product Expiration Date: 202304252359
ISSUE DATE / TIME: 202304041226
ISSUE DATE / TIME: 202304041226
ISSUE DATE / TIME: 202304041226
ISSUE DATE / TIME: 202304041226
ISSUE DATE / TIME: 202304041420
ISSUE DATE / TIME: 202304041427
ISSUE DATE / TIME: 202304041427
ISSUE DATE / TIME: 202304051137
Unit Type and Rh: 6200
Unit Type and Rh: 6200
Unit Type and Rh: 6200
Unit Type and Rh: 6200
Unit Type and Rh: 6200
Unit Type and Rh: 6200
Unit Type and Rh: 6200
Unit Type and Rh: 6200
Unit Type and Rh: 6200
Unit Type and Rh: 8400

## 2021-08-31 LAB — CBC WITH DIFFERENTIAL/PLATELET
Abs Immature Granulocytes: 0.08 10*3/uL — ABNORMAL HIGH (ref 0.00–0.07)
Basophils Absolute: 0 10*3/uL (ref 0.0–0.1)
Basophils Relative: 0 %
Eosinophils Absolute: 0 10*3/uL (ref 0.0–0.5)
Eosinophils Relative: 0 %
HCT: 24.2 % — ABNORMAL LOW (ref 36.0–46.0)
Hemoglobin: 8.3 g/dL — ABNORMAL LOW (ref 12.0–15.0)
Immature Granulocytes: 0 %
Lymphocytes Relative: 14 %
Lymphs Abs: 2.5 10*3/uL (ref 0.7–4.0)
MCH: 30.4 pg (ref 26.0–34.0)
MCHC: 34.3 g/dL (ref 30.0–36.0)
MCV: 88.6 fL (ref 80.0–100.0)
Monocytes Absolute: 2.1 10*3/uL — ABNORMAL HIGH (ref 0.1–1.0)
Monocytes Relative: 12 %
Neutro Abs: 13.4 10*3/uL — ABNORMAL HIGH (ref 1.7–7.7)
Neutrophils Relative %: 74 %
Platelets: 148 10*3/uL — ABNORMAL LOW (ref 150–400)
RBC: 2.73 MIL/uL — ABNORMAL LOW (ref 3.87–5.11)
RDW: 14.8 % (ref 11.5–15.5)
WBC: 18.1 10*3/uL — ABNORMAL HIGH (ref 4.0–10.5)
nRBC: 1 % — ABNORMAL HIGH (ref 0.0–0.2)

## 2021-08-31 LAB — CBC
HCT: 23.9 % — ABNORMAL LOW (ref 36.0–46.0)
HCT: 23.3 % — ABNORMAL LOW (ref 36.0–46.0)
Hemoglobin: 8.4 g/dL — ABNORMAL LOW (ref 12.0–15.0)
Hemoglobin: 8 g/dL — ABNORMAL LOW (ref 12.0–15.0)
MCH: 30.5 pg (ref 26.0–34.0)
MCH: 30.2 pg (ref 26.0–34.0)
MCHC: 35.1 g/dL (ref 30.0–36.0)
MCHC: 34.3 g/dL (ref 30.0–36.0)
MCV: 86.9 fL (ref 80.0–100.0)
MCV: 87.9 fL (ref 80.0–100.0)
Platelets: 145 10*3/uL — ABNORMAL LOW (ref 150–400)
Platelets: 146 10*3/uL — ABNORMAL LOW (ref 150–400)
RBC: 2.75 MIL/uL — ABNORMAL LOW (ref 3.87–5.11)
RBC: 2.65 MIL/uL — ABNORMAL LOW (ref 3.87–5.11)
RDW: 14.7 % (ref 11.5–15.5)
RDW: 14.6 % (ref 11.5–15.5)
WBC: 17.7 10*3/uL — ABNORMAL HIGH (ref 4.0–10.5)
WBC: 18.2 10*3/uL — ABNORMAL HIGH (ref 4.0–10.5)
nRBC: 1 % — ABNORMAL HIGH (ref 0.0–0.2)
nRBC: 1.1 % — ABNORMAL HIGH (ref 0.0–0.2)

## 2021-08-31 LAB — PREPARE FRESH FROZEN PLASMA
Unit division: 0
Unit division: 0
Unit division: 0
Unit division: 0
Unit division: 0
Unit division: 0
Unit division: 0
Unit division: 0

## 2021-08-31 LAB — SURGICAL PATHOLOGY

## 2021-08-31 MED ORDER — ENOXAPARIN SODIUM 40 MG/0.4ML IJ SOSY
40.0000 mg | PREFILLED_SYRINGE | INTRAMUSCULAR | Status: DC
  Administered 2021-09-01 – 2021-09-02 (×2): 40 mg via SUBCUTANEOUS
  Filled 2021-08-31 (×2): qty 0.4

## 2021-08-31 NOTE — Progress Notes (Signed)
POSTPARTUM PROGRESS NOTE ? ?POD #2 ? ?Subjective: ? ?Lori Baldwin is a 26 y.o. [phone] s/p rLTCS and C-hyst at [redacted]w[redacted]d. Today she notes feeling sore.  She does feel like medication is working well to help with pain.  She has not has much to drink as she states she is scared.  She has not yet ambulated.  Foley in place.  Denies nausea or vomiting. She has not passed flatus, no BM.  Lochia appropriate ?Denies fever/chills/chest pain/SOB.   ? ?Objective: ?Blood pressure (!) 106/57, pulse 81, temperature 98.4 ?F (36.9 ?C), temperature source Oral, resp. rate 15, height 5\' 4"  (1.626 m), weight 63.2 kg, last menstrual period 11/16/2020, SpO2 97 %, unknown if currently breastfeeding. ? ?Physical Exam:  ?General: alert, cooperative and no distress ?Chest: no respiratory distress,. CTAB ?Heart: regular rate and rhythm ?Abdomen: soft, BS quiet, mild distension noted ?Incision: C/D/I with honeycomb ?DVT Evaluation: No calf swelling or tenderness ?Extremities: 1+ edema, no calf tenderness bilaterally ?Skin: warm, dry ? ?Results for orders placed or performed during the hospital encounter of 08/28/21 (from the past 24 hour(s))  ?Glucose, capillary     Status: Abnormal  ? Collection Time: 08/30/21  7:40 AM  ?Result Value Ref Range  ? Glucose-Capillary 104 (H) 70 - 99 mg/dL  ?Protime-INR     Status: None  ? Collection Time: 08/30/21  8:55 AM  ?Result Value Ref Range  ? Prothrombin Time 13.8 11.4 - 15.2 seconds  ? INR 1.1 0.8 - 1.2  ?CBC     Status: Abnormal  ? Collection Time: 08/30/21 11:27 AM  ?Result Value Ref Range  ? WBC 12.5 (H) 4.0 - 10.5 K/uL  ? RBC 2.30 (L) 3.87 - 5.11 MIL/uL  ? Hemoglobin 7.1 (L) 12.0 - 15.0 g/dL  ? HCT 20.3 (L) 36.0 - 46.0 %  ? MCV 88.3 80.0 - 100.0 fL  ? MCH 30.9 26.0 - 34.0 pg  ? MCHC 35.0 30.0 - 36.0 g/dL  ? RDW 14.8 11.5 - 15.5 %  ? Platelets 112 (L) 150 - 400 K/uL  ? nRBC 0.6 (H) 0.0 - 0.2 %  ?magnesium level     Status: None  ? Collection Time: 08/30/21  7:58 PM  ?Result Value Ref Range  ?  Magnesium 2.0 1.7 - 2.4 mg/dL  ?CBC     Status: Abnormal  ? Collection Time: 08/30/21  7:58 PM  ?Result Value Ref Range  ? WBC 17.4 (H) 4.0 - 10.5 K/uL  ? RBC 2.85 (L) 3.87 - 5.11 MIL/uL  ? Hemoglobin 8.7 (L) 12.0 - 15.0 g/dL  ? HCT 24.8 (L) 36.0 - 46.0 %  ? MCV 87.0 80.0 - 100.0 fL  ? MCH 30.5 26.0 - 34.0 pg  ? MCHC 35.1 30.0 - 36.0 g/dL  ? RDW 14.6 11.5 - 15.5 %  ? Platelets 149 (L) 150 - 400 K/uL  ? nRBC 0.9 (H) 0.0 - 0.2 %  ?CBC     Status: Abnormal  ? Collection Time: 08/31/21  2:16 AM  ?Result Value Ref Range  ? WBC 17.7 (H) 4.0 - 10.5 K/uL  ? RBC 2.75 (L) 3.87 - 5.11 MIL/uL  ? Hemoglobin 8.4 (L) 12.0 - 15.0 g/dL  ? HCT 23.9 (L) 36.0 - 46.0 %  ? MCV 86.9 80.0 - 100.0 fL  ? MCH 30.5 26.0 - 34.0 pg  ? MCHC 35.1 30.0 - 36.0 g/dL  ? RDW 14.7 11.5 - 15.5 %  ? Platelets 145 (L) 150 - 400 K/uL  ?  nRBC 1.0 (H) 0.0 - 0.2 %  ? ? ?Assessment/Plan: ?MIRAH SITTLER is a 26 y.o. [phone] s/p rLTCS and C-hyst at [redacted]w[redacted]d POD#1 complicated by: ? ?1) PPH with hemorrhagic shock ?-s/p intubation, multiple units ?-VSS, lochia appropriate ?-labs stable as above ? ?2) Postop ?-encourage ambulation today ?-PT ordered ?-once ambulation improved will consider removal of Foley ?-continue strict I/Os ?-work on [employer] as tolerated ?-SCDs for now, may consider Lovenox if Hgb remains stable ? ?3) Leukocytosis ?-likely reactive change ?-Currently afebrile, no evidence of infection ?-will continue to closely monitor ? ?Feeding: breast ? ?Dispo: Continue inpatient management as outlined above- goal for today to work on ambulation and po intake ? ? LOS: 3 days  ? ?Janyth Pupa, DO ?Faculty Attending, [facility] [employer] [facility] ?08/31/2021, 7:28 AM  ?

## 2021-08-31 NOTE — Progress Notes (Signed)
Patient ortho vitals completed and patient transferred to recliner. Breakfast ordered and requested patient try breakfast in the recliner but to call out if unable to do so. Patient verbalized appreciation for being able to be out of the bed. ?

## 2021-08-31 NOTE — Evaluation (Addendum)
Physical Therapy Evaluation ?Patient Details ?Name: Lori Baldwin ?MRN: [id number] ?DOB: [birth year] ?Today's Date: 08/31/2021 ? ?History of Present Illness ? Pt adm 4/3 for induction of labor. Underwent c-section on 4/4. Pt with post op hemorrhage and underwent emergent hysterectomy and transfusion of 9u of blood. Remained intubated until 4/5. PMH - gestational diabetes.  ?Clinical Impression ? Pt presents to PT with decr mobility due to expected post op soreness. Expect pt will be able to mobilize on her own at DC but will need someone to assist her with caring for baby and possibly to negotiate stairs in home. Will continue to follow acutely. ?   ? ?Recommendations for follow up therapy are one component of a multi-disciplinary discharge planning process, led by the attending physician.  Recommendations may be updated based on patient status, additional functional criteria and insurance authorization. ? ?Follow Up Recommendations No PT follow up ? ?  ?Assistance Recommended at Discharge Intermittent Supervision/Assistance (Will need assist caring for baby)  ?Patient can return home with the following ? Help with stairs or ramp for entrance ? ?  ?Equipment Recommendations Rolling walker (2 wheels)  ?Recommendations for Other Services ?    ?  ?Functional Status Assessment Patient has had a recent decline in their functional status and demonstrates the ability to make significant improvements in function in a reasonable and predictable amount of time.  ? ?  ?Precautions / Restrictions Precautions ?Precautions: None  ? ?  ? ?Mobility ? Bed Mobility ?  ?  ?  ?  ?  ?  ?  ?General bed mobility comments: Pt up in chair ?  ? ?Transfers ?Overall transfer level: Needs assistance ?Equipment used: Rolling walker (2 wheels) ?Transfers: Sit to/from Stand ?Sit to Stand: Min guard ?  ?  ?  ?  ?  ?General transfer comment: Slow to rise. Assist for safety. Verbal cues for hand placement. ?  ? ?Ambulation/Gait ?Ambulation/Gait  assistance: Min guard ?Gait Distance (Feet): 50 Feet ?Assistive device: Rolling walker (2 wheels) ?Gait Pattern/deviations: Step-through pattern, Decreased step length - right, Decreased step length - left, Trunk flexed ?Gait velocity: decr ?Gait velocity interpretation: <1.31 ft/sec, indicative of household ambulator ?  ?General Gait Details: Assist for safety. Verbal cues to stand more erect. ? ?Stairs ?  ?  ?  ?  ?  ? ?Wheelchair Mobility ?  ? ?Modified Rankin (Stroke Patients Only) ?  ? ?  ? ?Balance Overall balance assessment: No apparent balance deficits (not formally assessed) ?  ?  ?  ?  ?  ?  ?  ?  ?  ?  ?  ?  ?  ?  ?  ?  ?  ?  ?   ? ? ? ?Pertinent Vitals/Pain Pain Assessment ?Pain Assessment: Faces ?Faces Pain Scale: Hurts a little bit ?Pain Location: abdomen and back ?Pain Descriptors / Indicators: Sore ?Pain Intervention(s): Limited activity within patient's tolerance  ? ? ?Home Living Family/patient expects to be discharged to:: Private residence ?Living Arrangements: Spouse/significant other ?Available Help at Discharge: Family;Available 24 hours/day (Per pt report) ?Type of Home: House ?Home Access: Level entry ?  ?  ?Alternate Level Stairs-Number of Steps: flight ?Home Layout: Two level;Bed/bath upstairs ?Home Equipment: None ?   ?  ?Prior Function Prior Level of Function : Independent/Modified Independent ?  ?  ?  ?  ?  ?  ?  ?  ?  ? ? ?Hand Dominance  ?   ? ?  ?Extremity/Trunk Assessment  ?  Upper Extremity Assessment ?Upper Extremity Assessment: Overall WFL for tasks assessed ?  ? ?Lower Extremity Assessment ?Lower Extremity Assessment: Overall WFL for tasks assessed ?  ? ?   ?Communication  ? Communication: No difficulties  ?Cognition Arousal/Alertness: Awake/alert ?Behavior During Therapy: [facility] for tasks assessed/performed ?Overall Cognitive Status: Within Functional Limits for tasks assessed ?  ?  ?  ?  ?  ?  ?  ?  ?  ?  ?  ?  ?  ?  ?  ?  ?  ?  ?  ? ?  ?General Comments   ? ?  ?Exercises     ? ?Assessment/Plan  ?  ?PT Assessment Patient needs continued PT services  ?PT Problem List Decreased mobility;Pain ? ?   ?  ?PT Treatment Interventions DME instruction;Gait training;Stair training;Functional mobility training;Patient/family education   ? ?PT Goals (Current goals can be found in the Care Plan section)  ?Acute Rehab PT Goals ?Patient Stated Goal: return home ?PT Goal Formulation: With patient ?Time For Goal Achievement: 09/07/21 ?Potential to Achieve Goals: Good ? ?  ?Frequency Min 3X/week ?  ? ? ?Co-evaluation   ?  ?  ?  ?  ? ? ?  ?AM-PAC PT "6 Clicks" Mobility  ?Outcome Measure Help needed turning from your back to your side while in a flat bed without using bedrails?: A Little ?Help needed moving from lying on your back to sitting on the side of a flat bed without using bedrails?: A Little ?Help needed moving to and from a bed to a chair (including a wheelchair)?: A Little ?Help needed standing up from a chair using your arms (e.g., wheelchair or bedside chair)?: A Little ?Help needed to walk in Baldwin room?: A Little ?Help needed climbing 3-5 steps with a railing? : A Little ?6 Click Score: 18 ? ?  ?End of Session   ?Activity Tolerance: Patient tolerated treatment well ?Patient left: in chair;with call bell/phone within reach ?Nurse Communication: Mobility status ?PT Visit Diagnosis: Other abnormalities of gait and mobility (R26.89);Pain ?Pain - part of body:  (abdomen) ?  ? ?Time: 6270-3500 ?PT Time Calculation (min) (ACUTE ONLY): 20 min ? ? ?Charges:   PT Evaluation ?$PT Eval Low Complexity: 1 Low ?  ?  ?   ? ? ?[facility] PT ?Acute Rehabilitation Services ?Pager [phone] ?Office [phone] ? ? ?Angelina Ok [facility] ?08/31/2021, 9:48 AM ? ?

## 2021-08-31 NOTE — Lactation Note (Signed)
This note was copied from a baby's chart. ?Lactation Consultation Note ? ?Patient Name: Lori Baldwin ?Today's Date: 08/31/2021 ?Reason for consult: Follow-up assessment (mom recently extubated.   PPH/shock) ?Age:27 hours ? ?Mom talked with [facility] about series of events after holding her baby STS, one of her last things she remembered before waking up yesterday. ? ?Mom does not desire to BF but does desire to pump and bottle feed her milk. ? ?She states when she did pump recently, only drops were seen.  LC provided education about milk removal and increasing volume.  ? ?Mom was encouraged to call out when she was ready to pump so RN can assist if needed. Mom understand that every 3 hours is ideal for stimulating milk supply. ? ?Mom does not have WIC but was told to call after delivery. SHe does not have a pump at home.   ? ?Message sent for follow up OP LC, Noralee Stain for pumping support if desired. ? ? ? ?Maternal Data ?  ? ?Feeding ?Mother's Current Feeding Choice: Breast Milk and Formula ?Nipple Type: Slow - flow ? ?LATCH Score ?  ? ?  ? ?  ? ?  ? ?  ? ?  ? ? ?Lactation Tools Discussed/Used ?Breast pump type: Double-Electric Breast Pump ?Pump Education: Setup, frequency, and cleaning ?Reason for Pumping: ...encouraged her to pump, when she is ready and feels she can, or to hand express to remove milk ?Pumping frequency: mom knows pumping every 3 hours is helpful to stimulate supply ? ?Interventions ?  ? ?Discharge ?Pump: DEBP ? ?Consult Status ?Consult Status: Follow-up ?Date: 09/01/21 ?Follow-up type: In-patient ? ? ? ?Maryruth Hancock Cerrone Debold ?08/31/2021, 10:10 AM ? ? ? ?

## 2021-09-01 DIAGNOSIS — D62 Acute posthemorrhagic anemia: Secondary | ICD-10-CM | POA: Diagnosis present

## 2021-09-01 LAB — CBC
HCT: 22.5 % — ABNORMAL LOW (ref 36.0–46.0)
Hemoglobin: 7.7 g/dL — ABNORMAL LOW (ref 12.0–15.0)
MCH: 30.3 pg (ref 26.0–34.0)
MCHC: 34.2 g/dL (ref 30.0–36.0)
MCV: 88.6 fL (ref 80.0–100.0)
Platelets: 158 10*3/uL (ref 150–400)
RBC: 2.54 MIL/uL — ABNORMAL LOW (ref 3.87–5.11)
RDW: 14.2 % (ref 11.5–15.5)
WBC: 17.1 10*3/uL — ABNORMAL HIGH (ref 4.0–10.5)
nRBC: 4.2 % — ABNORMAL HIGH (ref 0.0–0.2)

## 2021-09-01 LAB — TYPE AND SCREEN
ABO/RH(D): A POS
Antibody Screen: NEGATIVE
Unit division: 0
Unit division: 0
Unit division: 0
Unit division: 0
Unit division: 0
Unit division: 0
Unit division: 0
Unit division: 0
Unit division: 0
Unit division: 0
Unit division: 0
Unit division: 0
Unit division: 0
Unit division: 0
Unit division: 0
Unit division: 0
Unit division: 0
Unit division: 0

## 2021-09-01 LAB — BPAM RBC
Blood Product Expiration Date: 202305012359
Blood Product Expiration Date: 202305012359
Blood Product Expiration Date: 202305032359
Blood Product Expiration Date: 202305032359
Blood Product Expiration Date: 202305032359
Blood Product Expiration Date: 202305032359
Blood Product Expiration Date: 202305032359
Blood Product Expiration Date: 202305032359
Blood Product Expiration Date: 202305042359
Blood Product Expiration Date: 202305042359
Blood Product Expiration Date: 202305042359
Blood Product Expiration Date: 202305042359
Blood Product Expiration Date: 202305052359
Blood Product Expiration Date: 202305052359
Blood Product Expiration Date: 202305052359
Blood Product Expiration Date: 202305052359
Blood Product Expiration Date: 202305052359
Blood Product Expiration Date: 202305052359
ISSUE DATE / TIME: 202304041226
ISSUE DATE / TIME: 202304041226
ISSUE DATE / TIME: 202304041226
ISSUE DATE / TIME: 202304041226
ISSUE DATE / TIME: 202304041330
ISSUE DATE / TIME: 202304041330
ISSUE DATE / TIME: 202304041420
ISSUE DATE / TIME: 202304041420
ISSUE DATE / TIME: 202304041420
ISSUE DATE / TIME: 202304050032
ISSUE DATE / TIME: 202304050232
ISSUE DATE / TIME: 202304050625
ISSUE DATE / TIME: 202304050831
ISSUE DATE / TIME: 202304051254
Unit Type and Rh: 6200
Unit Type and Rh: 6200
Unit Type and Rh: 6200
Unit Type and Rh: 6200
Unit Type and Rh: 6200
Unit Type and Rh: 6200
Unit Type and Rh: 6200
Unit Type and Rh: 6200
Unit Type and Rh: 6200
Unit Type and Rh: 6200
Unit Type and Rh: 6200
Unit Type and Rh: 6200
Unit Type and Rh: 6200
Unit Type and Rh: 6200
Unit Type and Rh: 6200
Unit Type and Rh: 6200
Unit Type and Rh: 6200
Unit Type and Rh: 6200

## 2021-09-01 LAB — PATHOLOGIST SMEAR REVIEW

## 2021-09-01 MED ORDER — GABAPENTIN 300 MG PO CAPS
300.0000 mg | ORAL_CAPSULE | Freq: Two times a day (BID) | ORAL | Status: DC
  Administered 2021-09-01 – 2021-09-02 (×3): 300 mg via ORAL
  Filled 2021-09-01 (×3): qty 1

## 2021-09-01 MED ORDER — IBUPROFEN 600 MG PO TABS
600.0000 mg | ORAL_TABLET | Freq: Three times a day (TID) | ORAL | Status: DC | PRN
  Administered 2021-09-01: 600 mg via ORAL
  Filled 2021-09-01: qty 1

## 2021-09-01 MED ORDER — SODIUM CHLORIDE 0.9 % IV SOLN
500.0000 mg | Freq: Once | INTRAVENOUS | Status: AC
  Administered 2021-09-01: 500 mg via INTRAVENOUS
  Filled 2021-09-01: qty 25

## 2021-09-01 MED ORDER — OXYCODONE HCL 5 MG PO TABS
10.0000 mg | ORAL_TABLET | Freq: Four times a day (QID) | ORAL | Status: DC | PRN
  Administered 2021-09-01 – 2021-09-02 (×4): 10 mg via ORAL
  Filled 2021-09-01 (×4): qty 2

## 2021-09-01 MED ORDER — DOCUSATE SODIUM 100 MG PO CAPS
100.0000 mg | ORAL_CAPSULE | Freq: Two times a day (BID) | ORAL | Status: DC
  Administered 2021-09-01: 100 mg via ORAL
  Filled 2021-09-01: qty 1

## 2021-09-01 NOTE — Progress Notes (Signed)
Physical Therapy Treatment ?Patient Details ?Name: Lori Baldwin ?MRN: [id number] ?DOB: 1995-10-06 ?Today's Date: 09/01/2021 ? ? ?History of Present Illness Pt adm 4/3 for induction of labor. Underwent c-section on 4/4. Pt with post op hemorrhage and underwent emergent hysterectomy and transfusion of 9u of blood. Remained intubated until 4/5. PMH - gestational diabetes. ? ?  ?PT Comments  ? ? Pt making good progress with mobility. Remains slow due to soreness.    ?Recommendations for follow up therapy are one component of a multi-disciplinary discharge planning process, led by the attending physician.  Recommendations may be updated based on patient status, additional functional criteria and insurance authorization. ? ?Follow Up Recommendations ? No PT follow up ?  ?  ?Assistance Recommended at Discharge Intermittent Supervision/Assistance (Will need assist caring for baby)  ?Patient can return home with the following Help with stairs or ramp for entrance ?  ?Equipment Recommendations ? Rolling walker (2 wheels)  ?  ?Recommendations for Other Services   ? ? ?  ?Precautions / Restrictions Precautions ?Precautions: None  ?  ? ?Mobility ? Bed Mobility ?Overal bed mobility: Modified Independent ?  ?  ?  ?  ?  ?  ?General bed mobility comments: Incr time and HOB elevated ?  ? ?Transfers ?Overall transfer level: Needs assistance ?Equipment used: Rolling walker (2 wheels) ?Transfers: Sit to/from Stand ?Sit to Stand: Supervision ?  ?  ?  ?  ?  ?General transfer comment: Slow to rise. Assist for safety. Verbal cues for hand placement. ?  ? ?Ambulation/Gait ?Ambulation/Gait assistance: Supervision ?Gait Distance (Feet): 150 Feet ?Assistive device: Rolling walker (2 wheels) ?Gait Pattern/deviations: Step-through pattern, Decreased step length - right, Decreased step length - left, Trunk flexed ?Gait velocity: decr ?Gait velocity interpretation: <1.31 ft/sec, indicative of household ambulator ?  ?General Gait Details: Assist for  safety. Verbal cues to stand more erect and to stay . ? ? ?Stairs ?  ?  ?  ?  ?  ? ? ?Wheelchair Mobility ?  ? ?Modified Rankin (Stroke Patients Only) ?  ? ? ?  ?Balance Overall balance assessment: No apparent balance deficits (not formally assessed) ?  ?  ?  ?  ?  ?  ?  ?  ?  ?  ?  ?  ?  ?  ?  ?  ?  ?  ?  ? ?  ?Cognition Arousal/Alertness: Awake/alert ?Behavior During Therapy: Lori Baldwin for tasks assessed/performed ?Overall Cognitive Status: Within Functional Limits for tasks assessed ?  ?  ?  ?  ?  ?  ?  ?  ?  ?  ?  ?  ?  ?  ?  ?  ?  ?  ?  ? ?  ?Exercises   ? ?  ?General Comments   ?  ?  ? ?Pertinent Vitals/Pain Pain Assessment ?Pain Assessment: Faces ?Faces Pain Scale: Hurts a little bit ?Pain Location: abdomen and back ?Pain Descriptors / Indicators: Sore ?Pain Intervention(s): Monitored during session  ? ? ?Home Living   ?  ?  ?  ?  ?  ?  ?  ?  ?  ?   ?  ?Prior Function    ?  ?  ?   ? ?PT Goals (current goals can now be found in the care plan section) Acute Rehab PT Goals ?Patient Stated Goal: return home ?Progress towards PT goals: Progressing toward goals ? ?  ?Frequency ? ? ? Min 3X/week ? ? ? ?  ?PT  Plan Current plan remains appropriate  ? ? ?Co-evaluation   ?  ?  ?  ?  ? ?  ?AM-PAC PT "6 Clicks" Mobility   ?Outcome Measure ? Help needed turning from your back to your side while in a flat bed without using bedrails?: A Little ?Help needed moving from lying on your back to sitting on the side of a flat bed without using bedrails?: A Little ?Help needed moving to and from a bed to a chair (including a wheelchair)?: A Little ?Help needed standing up from a chair using your arms (e.g., wheelchair or bedside chair)?: A Little ?Help needed to walk in Baldwin room?: A Little ?Help needed climbing 3-5 steps with a railing? : A Little ?6 Click Score: 18 ? ?  ?End of Session   ?Activity Tolerance: Patient tolerated treatment well ?Patient left: in chair;with call bell/phone within reach ?Nurse Communication: Mobility  status ?PT Visit Diagnosis: Other abnormalities of gait and mobility (R26.89);Pain ?Pain - part of body:  (abdomen) ?  ? ? ?Time: 2585-2778 ?PT Time Calculation (min) (ACUTE ONLY): 19 min ? ?Charges:  $Gait Training: 8-22 mins          ?          ? ?[facility] PT ?Acute Rehabilitation Services ?Pager [phone] ?Office [phone] ? ? ? ?Angelina Ok [facility] ?09/01/2021, 3:44 PM ? ?

## 2021-09-01 NOTE — Lactation Note (Signed)
This note was copied from a baby's chart. ?Lactation Consultation Note ?Mom has been through a lot since delivery but feeling better. Mom has been formula feeding baby. Mom stated she hasn't pumped in a couple of days and she thought about trying to pump to see if she has anything yet. Mom doesn't feel like she has any milk yet. Discussed with all that has happened it may be a little later that her milk may come but if she would like to stimulate her breast and get her milk in pumping will help. ?Asked mom if she has any questions or needs anything to call for Lactation. ? ?Patient Name: Boy [facility] ?Today's Date: 09/01/2021 ?Reason for consult: Follow-up assessment;Term ?Age:27 days ? ?Maternal Data ?  ? ?Feeding ?Nipple Type: Slow - flow ? ?LATCH Score ?  ? ?  ? ?  ? ?  ? ?  ? ?  ? ? ?Lactation Tools Discussed/Used ?  ? ?Interventions ?  ? ?Discharge ?  ? ?Consult Status ?Consult Status: PRN ?Date: 09/02/21 ?Follow-up type: In-patient ? ? ? ?Charyl Dancer ?09/01/2021, 8:40 PM ? ? ? ?

## 2021-09-01 NOTE — Progress Notes (Addendum)
POSTPARTUM PROGRESS NOTE ? ?POD #3 ? ?Subjective: ? ?Lori Baldwin is a 26 y.o. [phone] s/p rLTCS and C-hyst at [redacted]w[redacted]d. Today she does feel like medication is working well to help with pain.  She has not has much to drink  or eat as she states she is scared.  She has ambulated, no lightheadedness or dizziness.  Denies nausea or vomiting. She has passed flatus, no BM yet.  Lochia appropriate ?Denies fever/chills/chest pain/SOB.   ? ?Objective: ?Blood pressure (!) 105/57, pulse 71, temperature 98.5 ?F (36.9 ?C), temperature source Oral, resp. rate 16, height 5\' 4"  (1.626 m), weight 59 kg, last menstrual period 11/16/2020, SpO2 99 %, unknown if currently breastfeeding. ? ?Physical Exam:  ?General: alert, cooperative and no distress ?Chest: no respiratory distress,. CTAB ?Heart: regular rate and rhythm ?Abdomen: soft, BS quiet, mild distension noted ?Incision: C/D/I with honeycomb ?DVT Evaluation: No calf swelling or tenderness ?Extremities: 1+ edema, no calf tenderness bilaterally ?Skin: warm, dry ? ? ?  Latest Ref Rng & Units 09/01/2021  ?  5:06 AM 08/31/2021  ?  9:41 AM 08/31/2021  ?  2:16 AM  ?CBC  ?WBC 4.0 - 10.5 K/uL 17.1   18.2   17.7    ? 18.1    ?Hemoglobin 12.0 - 15.0 g/dL 7.7   8.0   8.4    ? 8.3    ?Hematocrit 36.0 - 46.0 % 22.5   23.3   23.9    ? 24.2    ?Platelets 150 - 400 K/uL 158   146   145    ? 148    ? ? ? ?Assessment/Plan: ?Lori Baldwin is a 26 y.o. 30 POD#3 s/p rLTCS and C-hyst at [redacted]w[redacted]d complicated by: ? ?1) PPH with hemorrhagic shock, acute blood loss anemia ?-s/p intubation, multiple units ?-VSS, lochia appropriate ?-labs stable as above ?-Venofer ordered today ? ?2) Postoperative pain ?- Increased Oxycodone to 10 mg q6h prn ?- Added Gabapentin to help with pain ?- Platelets and hemoglobin are stable,  will add Ibuprofen 600 mg q8h prn  ?- Continue heating pad as needed ? ?3) Postop ?-encourage ambulation today ?-work on advancing diet as tolerated ?-SCDs for now, may consider Lovenox if Hgb  remains stable or patient doe snot move much ? ? ?4) Infant ?- Feeding: breast ?- Patient desires circumcision for her female infant.  Circumcision procedure details discussed, risks and benefits of procedure were also discussed.  These include but are not limited to: Benefits of circumcision in men include reduction in the rates of urinary tract infection (UTI), penile cancer, some sexually transmitted infections, penile inflammatory and retractile disorders, as well as easier hygiene.  Risks include bleeding , infection, injury of glans which may lead to penile deformity or urinary tract issues, unsatisfactory cosmetic appearance and other potential complications related to the procedure.  It was emphasized that this is an elective procedure.  Patient wants to proceed with circumcision; written informed consent will be obtained.  Circumcision will be done before infant is discharged. ? ?Dispo: Continue inpatient management as outlined above- goal for today to work on ambulation and po intake ? ? LOS: 4 days  ? ? ? ?[redacted]w[redacted]d, MD ?09/01/2021, 10:55 AM  ?

## 2021-09-02 MED ORDER — GABAPENTIN 300 MG PO CAPS: 300.0000 mg | ORAL_CAPSULE | Freq: Two times a day (BID) | ORAL | 0 refills | Status: DC

## 2021-09-02 MED ORDER — IBUPROFEN 600 MG PO TABS: 600.0000 mg | ORAL_TABLET | Freq: Four times a day (QID) | ORAL | 1 refills | Status: DC

## 2021-09-02 MED ORDER — OXYCODONE HCL 10 MG PO TABS: 10.0000 mg | ORAL_TABLET | Freq: Four times a day (QID) | ORAL | 0 refills | Status: DC | PRN

## 2021-09-02 NOTE — Plan of Care (Signed)
?  Problem: Education: Goal: Knowledge of General Education information will improve Description: Including pain rating scale, medication(s)/side effects and non-pharmacologic comfort measures Outcome: Completed/Met   Problem: Health Behavior/Discharge Planning: Goal: Ability to manage health-related needs will improve Outcome: Completed/Met   Problem: Clinical Measurements: Goal: Ability to maintain clinical measurements within normal limits will improve Outcome: Completed/Met Goal: Will remain free from infection Outcome: Completed/Met Goal: Diagnostic test results will improve Outcome: Completed/Met Goal: Respiratory complications will improve Outcome: Completed/Met Goal: Cardiovascular complication will be avoided Outcome: Completed/Met   Problem: Activity: Goal: Risk for activity intolerance will decrease Outcome: Completed/Met   Problem: Nutrition: Goal: Adequate nutrition will be maintained Outcome: Completed/Met   Problem: Coping: Goal: Level of anxiety will decrease Outcome: Completed/Met   Problem: Elimination: Goal: Will not experience complications related to bowel motility Outcome: Completed/Met Goal: Will not experience complications related to urinary retention Outcome: Completed/Met   Problem: Pain Managment: Goal: General experience of comfort will improve Outcome: Completed/Met   Problem: Safety: Goal: Ability to remain free from injury will improve Outcome: Completed/Met   Problem: Skin Integrity: Goal: Risk for impaired skin integrity will decrease Outcome: Completed/Met   Problem: Education: Goal: Knowledge of condition will improve Outcome: Completed/Met Goal: Individualized Educational Video(s) Outcome: Completed/Met Goal: Individualized Newborn Educational Video(s) Outcome: Completed/Met   Problem: Activity: Goal: Will verbalize the importance of balancing activity with adequate rest periods Outcome: Completed/Met Goal: Ability to  tolerate increased activity will improve Outcome: Completed/Met   Problem: Coping: Goal: Ability to identify and utilize available resources and services will improve Outcome: Completed/Met   Problem: Life Cycle: Goal: Chance of risk for complications during the postpartum period will decrease Outcome: Completed/Met   Problem: Role Relationship: Goal: Ability to demonstrate positive interaction with newborn will improve Outcome: Completed/Met   Problem: Skin Integrity: Goal: Demonstration of wound healing without infection will improve Outcome: Completed/Met   Problem: Education: Goal: Knowledge of General Education information will improve Description: Including pain rating scale, medication(s)/side effects and non-pharmacologic comfort measures Outcome: Completed/Met   Problem: Health Behavior/Discharge Planning: Goal: Ability to manage health-related needs will improve Outcome: Completed/Met   Problem: Clinical Measurements: Goal: Ability to maintain clinical measurements within normal limits will improve Outcome: Completed/Met Goal: Will remain free from infection Outcome: Completed/Met Goal: Diagnostic test results will improve Outcome: Completed/Met Goal: Respiratory complications will improve Outcome: Completed/Met Goal: Cardiovascular complication will be avoided Outcome: Completed/Met   Problem: Activity: Goal: Risk for activity intolerance will decrease Outcome: Completed/Met   Problem: Nutrition: Goal: Adequate nutrition will be maintained Outcome: Completed/Met   Problem: Coping: Goal: Level of anxiety will decrease Outcome: Completed/Met   Problem: Elimination: Goal: Will not experience complications related to bowel motility Outcome: Completed/Met Goal: Will not experience complications related to urinary retention Outcome: Completed/Met   Problem: Pain Managment: Goal: General experience of comfort will improve Outcome: Completed/Met    Problem: Safety: Goal: Ability to remain free from injury will improve Outcome: Completed/Met   Problem: Skin Integrity: Goal: Risk for impaired skin integrity will decrease Outcome: Completed/Met   

## 2021-09-02 NOTE — Progress Notes (Signed)
Discharge delayed. Awaiting infant discharge orders, Transition of Care consult, and OT to see pt. ?

## 2021-09-02 NOTE — Progress Notes (Signed)
Dr. Alysia Penna called per patient request. Patient and family member unclear on reason for discharge today. Provider called to clarify. ?

## 2021-09-02 NOTE — Progress Notes (Signed)
Physical Therapy Treatment ?Patient Details ?Name: Lori Baldwin [facility] ?MRN: [id number] ?DOB: 1996/03/25 ?Today's Date: 09/02/2021 ? ? ?History of Present Illness Pt adm 4/3 for induction of labor. Underwent c-section on 4/4. Pt with post op hemorrhage and underwent emergent hysterectomy and transfusion of 9u of blood. Remained intubated until 4/5. PMH - gestational diabetes. ? ?  ?PT Comments  ? ? Pt has continued to progress with mobility and no longer needs the walker and was able to complete stairs. Ready for dc from PT standpoint. Instructed pt to steadily incr activity at home and to have assist bringing baby up/down the stairs initially.    ?Recommendations for follow up therapy are one component of a multi-disciplinary discharge planning process, led by the attending physician.  Recommendations may be updated based on patient status, additional functional criteria and insurance authorization. ? ?Follow Up Recommendations ? No PT follow up ?  ?  ?Assistance Recommended at Discharge Intermittent Supervision/Assistance (Will need assist caring for baby)  ?Patient can return home with the following   ?  ?Equipment Recommendations ? None recommended by PT  ?  ?Recommendations for Other Services   ? ? ?  ?Precautions / Restrictions Precautions ?Precautions: None  ?  ? ?Mobility ? Bed Mobility ?Overal bed mobility: Modified Independent ?  ?  ?  ?  ?  ?  ?  ?  ? ?Transfers ?Overall transfer level: Modified independent ?Equipment used: None ?Transfers: Sit to/from Stand ?Sit to Stand: Modified independent (Device/Increase time) ?  ?  ?  ?  ?  ?General transfer comment: Incr time ?  ? ?Ambulation/Gait ?Ambulation/Gait assistance: Modified independent (Device/Increase time) ?Gait Distance (Feet): 50 Feet ?Assistive device: None ?Gait Pattern/deviations: Trunk flexed, Decreased stride length ?Gait velocity: decr ?Gait velocity interpretation: 1.31 - 2.62 ft/sec, indicative of limited community ambulator ?  ?General Gait  Details: Slow, steady gait ? ? ?Stairs ?Stairs: Yes ?Stairs assistance: Modified independent (Device/Increase time) ?Stair Management: One rail Right, Step to pattern, Forwards ?Number of Stairs: 10 ?General stair comments: Simulated with 1 step performed repeatedly with countertop as rail. ? ? ?Wheelchair Mobility ?  ? ?Modified Rankin (Stroke Patients Only) ?  ? ? ?  ?Balance Overall balance assessment: No apparent balance deficits (not formally assessed) ?  ?  ?  ?  ?  ?  ?  ?  ?  ?  ?  ?  ?  ?  ?  ?  ?  ?  ?  ? ?  ?Cognition Arousal/Alertness: Awake/alert ?Behavior During Therapy: [facility] for tasks assessed/performed ?Overall Cognitive Status: Within Functional Limits for tasks assessed ?  ?  ?  ?  ?  ?  ?  ?  ?  ?  ?  ?  ?  ?  ?  ?  ?  ?  ?  ? ?  ?Exercises   ? ?  ?General Comments   ?  ?  ? ?Pertinent Vitals/Pain Pain Assessment ?Pain Assessment: Faces ?Faces Pain Scale: Hurts a little bit ?Pain Location: abdomen and back ?Pain Descriptors / Indicators: Sore ?Pain Intervention(s): Monitored during session  ? ? ?Home Living   ?  ?  ?  ?  ?  ?  ?  ?  ?  ?   ?  ?Prior Function    ?  ?  ?   ? ?PT Goals (current goals can now be found in the care plan section) Acute Rehab PT Goals ?Patient Stated Goal: return home ?Progress towards PT  goals: Goals met/education completed, patient discharged from PT ? ?  ?Frequency ? ? ?   ? ? ? ?  ?PT Plan    ? ? ?Co-evaluation   ?  ?  ?  ?  ? ?  ?AM-PAC PT "6 Clicks" Mobility   ?Outcome Measure ? Help needed turning from your back to your side while in a flat bed without using bedrails?: None ?Help needed moving from lying on your back to sitting on the side of a flat bed without using bedrails?: None ?Help needed moving to and from a bed to a chair (including a wheelchair)?: None ?Help needed standing up from a chair using your arms (e.g., wheelchair or bedside chair)?: None ?Help needed to walk in hospital room?: None ?Help needed climbing 3-5 steps with a railing? : None ?6 Click  Score: 24 ? ?  ?End of Session   ?Activity Tolerance: Patient tolerated treatment well ?Patient left: with call bell/phone within reach;in bed (EOB) ?Nurse Communication: Mobility status ?PT Visit Diagnosis: Other abnormalities of gait and mobility (R26.89);Pain ?Pain - part of body:  (abdomen) ?  ? ? ?Time: 2929-0903 ?PT Time Calculation (min) (ACUTE ONLY): 16 min ? ?Charges:  $Gait Training: 8-22 mins          ?          ? ?[facility] PT ?Acute Rehabilitation Services ?Pager [phone] ?Office [phone] ? ? ? ?Shary Decamp [facility] ?09/02/2021, 1:45 PM ? ?

## 2021-09-04 ENCOUNTER — Telehealth: Payer: Self-pay | Admitting: General Practice

## 2021-09-04 NOTE — Telephone Encounter (Signed)
Patient called into front office stating her milk came in last night and latched the baby to the breast for a feeding. She was later concerned about all the medications she is on and if it is safe for breastfeeding or not. Discussed with patient oxycodone and gabapentin are safe for breastfeeding. Patient verbalized understanding & had no other questions at this time. ?

## 2021-09-05 ENCOUNTER — Ambulatory Visit: Payer: Medicaid Other

## 2021-09-06 ENCOUNTER — Ambulatory Visit: Payer: Medicaid Other

## 2021-09-07 ENCOUNTER — Encounter: Payer: Self-pay | Admitting: Lactation Services

## 2021-09-09 ENCOUNTER — Telehealth (HOSPITAL_COMMUNITY): Payer: Self-pay

## 2021-09-09 NOTE — Telephone Encounter (Signed)
No answer. No voicemail option.  ? Heron Nay ?09/09/2021,0934 ?

## 2021-09-11 ENCOUNTER — Telehealth: Payer: Self-pay | Admitting: Family Medicine

## 2021-09-11 NOTE — Telephone Encounter (Signed)
Refill on Oxycodone and Ibuprofen  ?

## 2021-09-12 ENCOUNTER — Ambulatory Visit (INDEPENDENT_AMBULATORY_CARE_PROVIDER_SITE_OTHER): Payer: Medicaid Other

## 2021-09-12 VITALS — BP 105/67 | HR 84 | Wt 115.5 lb

## 2021-09-12 DIAGNOSIS — Z5189 Encounter for other specified aftercare: Secondary | ICD-10-CM

## 2021-09-12 NOTE — Patient Instructions (Signed)
Fax number: [phone] ?

## 2021-09-12 NOTE — Telephone Encounter (Signed)
I called Lori Baldwin and she informed me she had discussed this request with nurse at [facility] today and is fine. ?Nancy Fetter ?

## 2021-09-12 NOTE — Progress Notes (Signed)
Incision Check Visit ? ?Lori Baldwin is here for incision check following repeat c-section on 08/29/21. Honeycomb dressing removed. Incision is clean, dry, and intact. Dermabond visualized on part of incision. Reviewed s/s of infection and good wound care. Patient reports pain is well controlled. Reports some issue with breast milk supply. Currently breastfeeding once daily. Encouraged pt to put baby to breast prior to supplementing with formula. Encouraged pt to pump if giving formula to encourage milk supply. Encouraged skin to skin and increased hydration. Pt to return for lactation visit 09/19/21; pt declines earlier appt. States she has manual pump. Pt given information for insurance to receive electric pump. Pt to return for PP visit on 09/27/21. ? ?[name], RN ?09/12/2021  10:54 AM ? ?

## 2021-09-15 IMAGING — CT CT ABD-PELV W/ CM
2 of 4 series · 16 of 46 positions shown, 18 images · IV contrast (OMNIPAQUE)
Comparison: None.

CLINICAL DATA: 23-year-old female with abdominal and pelvic pain
and vomiting.

EXAM:
CT ABDOMEN AND PELVIS WITH CONTRAST
TECHNIQUE: Multidetector CT imaging of the abdomen and pelvis was performed
using the standard protocol following bolus administration of
intravenous contrast.
CONTRAST:  80mL OMNIPAQUE IOHEXOL 300 MG/ML  SOLN

[Series 2: axial st · axial · 0.53mm/px · z∈[-374,-49]mm · 13 of 75 slices shown, 15 images]
[im 5/75  soft-tissue]
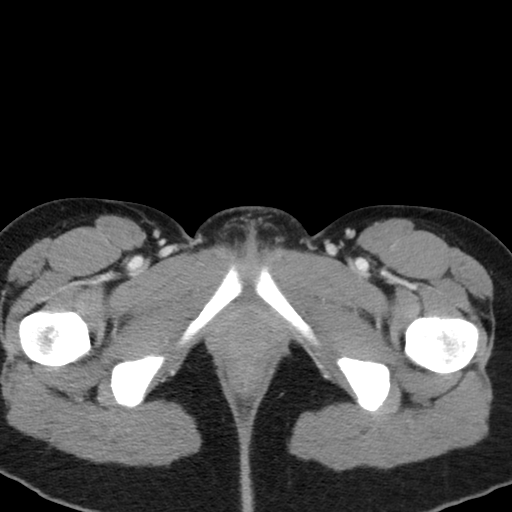
[im 5/75  bone]
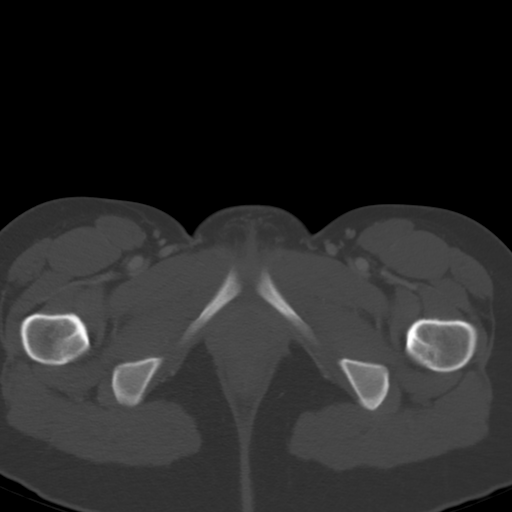
[im 9/75  soft-tissue]
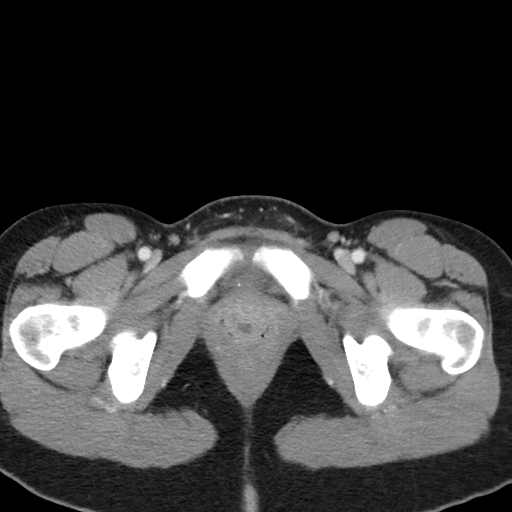
[im 18/75  soft-tissue]
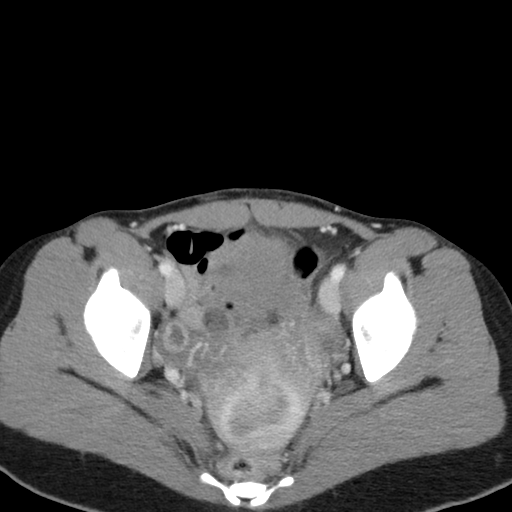
[im 22/75  soft-tissue]
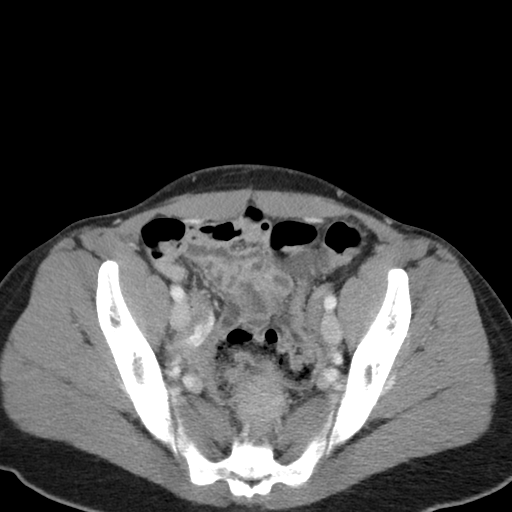
[im 27/75  soft-tissue]
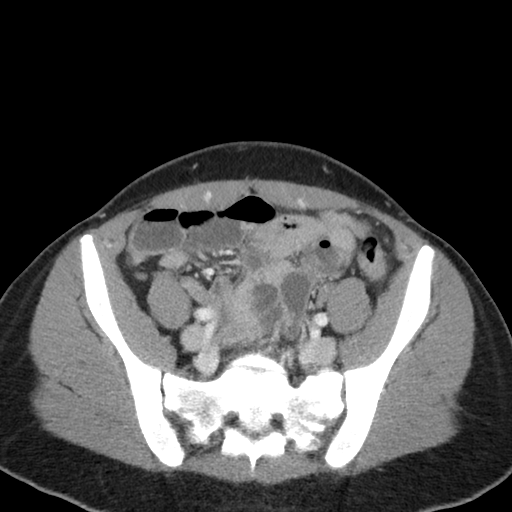
[im 31/75  soft-tissue]
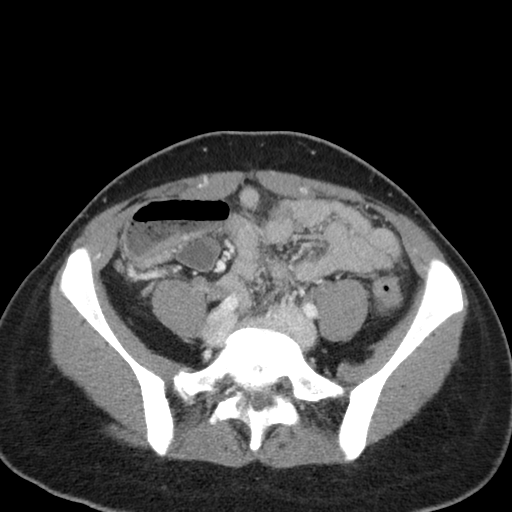
[im 40/75  soft-tissue]
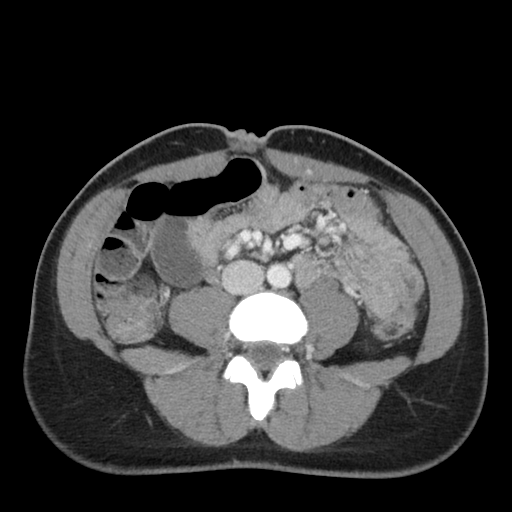
[im 44/75  soft-tissue]
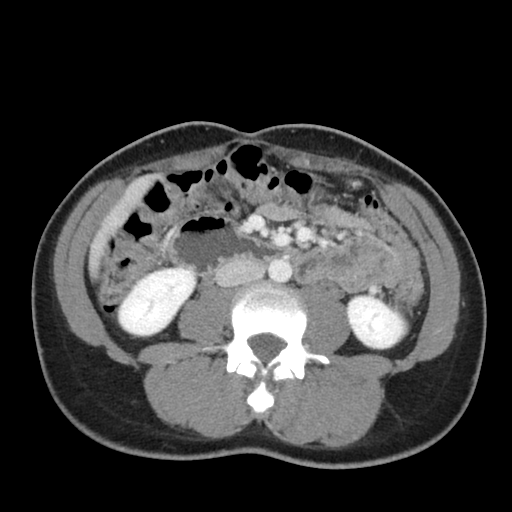
[im 48/75  soft-tissue]
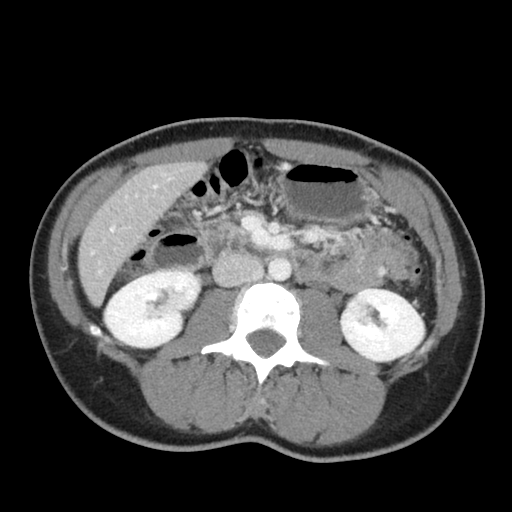
[im 48/75  bone]
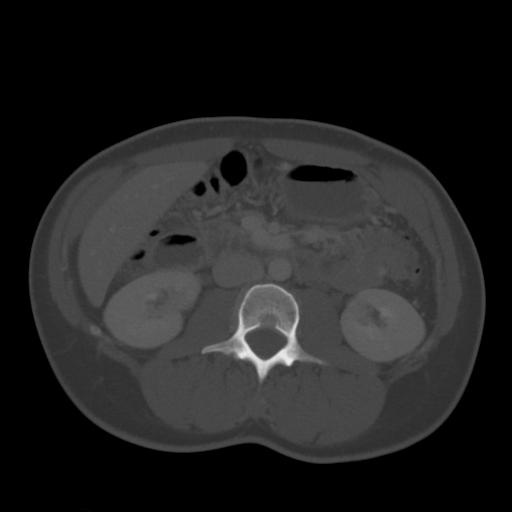
[im 53/75  soft-tissue]
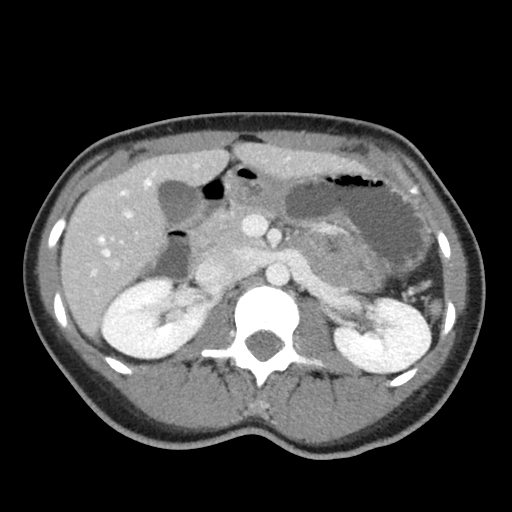
[im 57/75  soft-tissue]
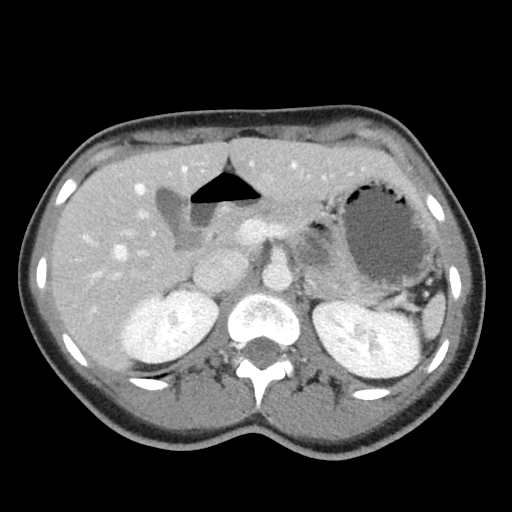
[im 66/75  soft-tissue]
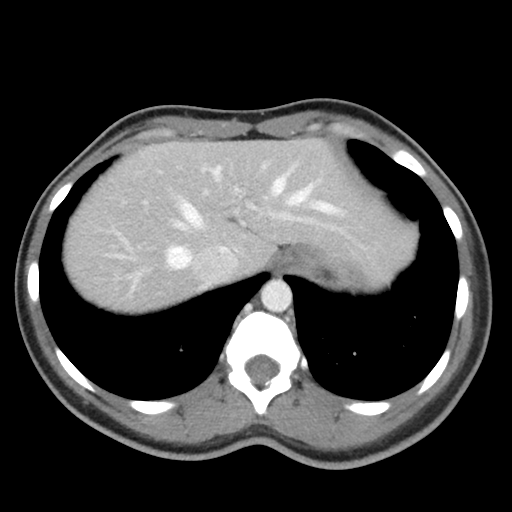
[im 70/75  soft-tissue]
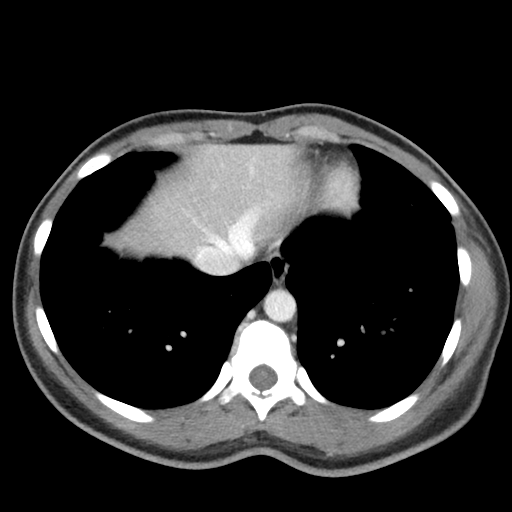

[Series 5: coronal st · coronal · 0.51mm/px · 3 of 64 slices shown]
[im 22/64  soft-tissue]
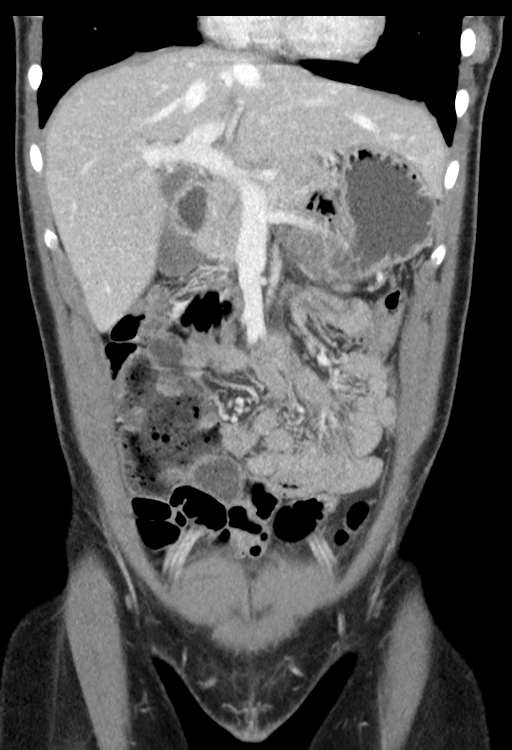
[im 29/64  soft-tissue]
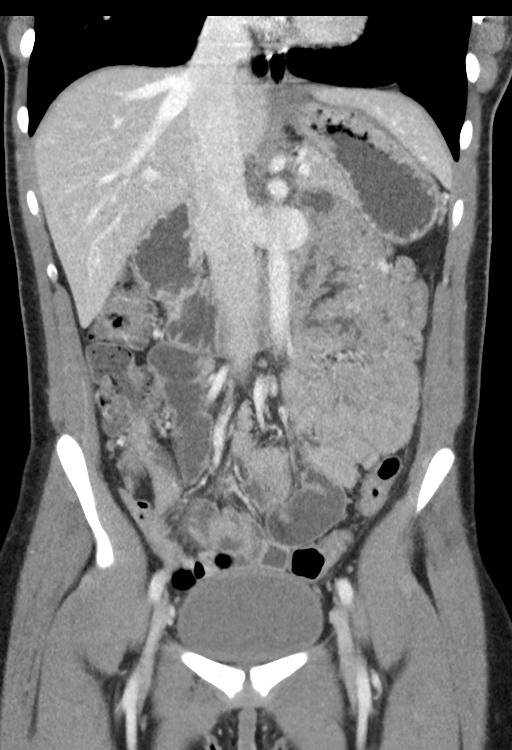
[im 36/64  soft-tissue]
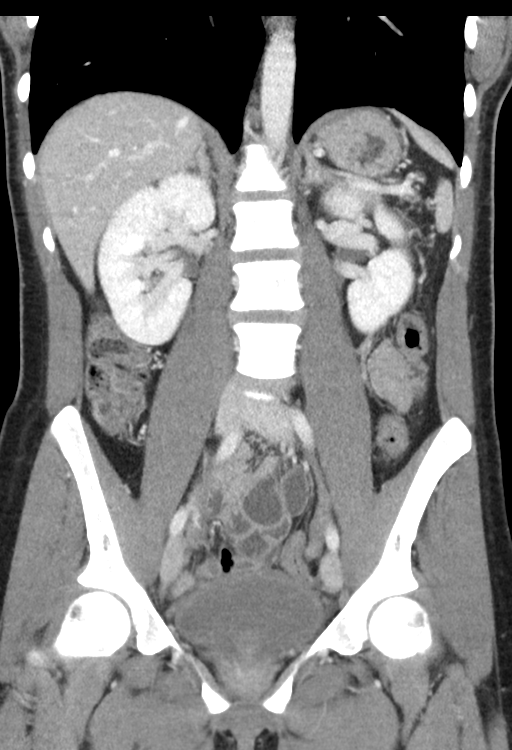

[16 of 46 positions shown; findings below may reference images not displayed]

FINDINGS: Lower chest: Unremarkable

Hepatobiliary: The liver and gallbladder are unremarkable. No
biliary dilatation.

Pancreas: Unremarkable

Spleen: Small but otherwise unremarkable.

Adrenals/Urinary Tract: The kidneys, adrenal glands and bladder are
unremarkable.

Stomach/Bowel: Stomach is within normal limits. What appears to be
the appendix is normal. No evidence of bowel wall thickening,
distention, or inflammatory changes.

Vascular/Lymphatic: No significant vascular findings are present. No
enlarged abdominal or pelvic lymph nodes.

Reproductive: Uterus and bilateral adnexa are unremarkable except
for a retroverted uterus.

Other: No ascites, focal collection or pneumoperitoneum.

Musculoskeletal: No acute or significant osseous findings.
IMPRESSION: No acute or significant abnormalities.

## 2021-09-18 ENCOUNTER — Telehealth: Payer: Self-pay | Admitting: General Practice

## 2021-09-18 ENCOUNTER — Other Ambulatory Visit: Payer: Self-pay | Admitting: Family Medicine

## 2021-09-18 MED ORDER — OXYCODONE HCL 10 MG PO TABS: 10.0000 mg | ORAL_TABLET | Freq: Four times a day (QID) | ORAL | 0 refills | Status: DC | PRN

## 2021-09-18 NOTE — Progress Notes (Signed)
Oxycodone refill for post op pain until next visit ?

## 2021-09-18 NOTE — Telephone Encounter (Signed)
MyChart message addressed via telephone encounter 09/18/21. ?

## 2021-09-18 NOTE — Telephone Encounter (Signed)
Patient called into front office requesting refill of oxycodone Rx. Patient states she had a c-section & emergency hysterectomy on 4/4 and spent several days in the ICU in the hospital before being discharged home. Patient states she is out of oxycodone and has been taking ibuprofen every 6 hours and tylenol every 6 hours but it isn't helping. She reports her pain at a dull 6. She states she has a newborn and a 27 year old she is caring for as a single parent and still has household things she has to do. Patient states she is struggling to do those things with her pain being a 6 and feels a refill would be appropriate in her situation. Discussed with Dr Crissie Reese who is okay with giving enough of a refill to get her until her pp visit on Wednesday next week. Discussed with patient. Advised we wouldn't expect her to be pain free at this point but we certainly want her pain to be manageable. Patient verbalized understanding. ?

## 2021-09-27 ENCOUNTER — Other Ambulatory Visit: Payer: Medicaid Other

## 2021-09-27 ENCOUNTER — Ambulatory Visit (INDEPENDENT_AMBULATORY_CARE_PROVIDER_SITE_OTHER): Payer: Medicaid Other | Admitting: Certified Nurse Midwife

## 2021-09-27 DIAGNOSIS — O9270 Unspecified disorders of lactation: Secondary | ICD-10-CM | POA: Diagnosis not present

## 2021-09-27 DIAGNOSIS — O24419 Gestational diabetes mellitus in pregnancy, unspecified control: Secondary | ICD-10-CM

## 2021-09-27 DIAGNOSIS — Z90710 Acquired absence of both cervix and uterus: Secondary | ICD-10-CM

## 2021-09-27 DIAGNOSIS — R300 Dysuria: Secondary | ICD-10-CM | POA: Diagnosis not present

## 2021-09-27 LAB — POCT URINALYSIS DIP (DEVICE)
Bilirubin Urine: NEGATIVE
Glucose, UA: NEGATIVE mg/dL
Hgb urine dipstick: NEGATIVE
Ketones, ur: NEGATIVE mg/dL
Nitrite: NEGATIVE
Protein, ur: NEGATIVE mg/dL
Specific Gravity, Urine: 1.02 (ref 1.005–1.030)
Urobilinogen, UA: 0.2 mg/dL (ref 0.0–1.0)
pH: 6 (ref 5.0–8.0)

## 2021-09-27 NOTE — Progress Notes (Signed)
? ?  Lactation Consultation Note ? ?Subjective:  ? Lori Baldwin is a 26 y.o. [phone] female here for a lactation consultation. Current complaints: Has been trying to breastfeed but does not have ample supply. She underwent an emergent repeat Cesarean for fetal distress followed by a large volume hemorrhage (nearly 6L) and hysterectomy. She spent the baby's first two days of life in the ICU, he was fed by bottle by the FOB and nursing staff. Since getting home, she has been attempting to pump multiple times per day (4-5) and puts the baby to breast (1-3x/day). Notes that he latches well without pain, eats off both sides but is only satisfied after the first feeding of the day. After that, he nurses with swallows for a few minutes and then falls asleep to wake shortly after and cue again until he gets formula.  ? ?Wants to breastfeed but is feeling stressed by the instructions to pump, take care of her post-op self, etc. She likes the closeness of feeding and skin-to-skin, but feels she should maybe stop nursing because the pumping is so stressful.  ? ?Gynecologic History ?No LMP recorded. ? ?Contraception: status post hysterectomy ? ?Health Maintenance Due  ?Topic Date Due  ? URINE MICROALBUMIN  Never done  ? HPV VACCINES (1 - 2-dose series) Never done  ? ? ?The following portions of the patient's history were reviewed and updated as appropriate: allergies, current medications, past family history, past medical history, past social history, past surgical history and problem list. ? ?Review of Systems ?Pertinent items noted in HPI and remainder of comprehensive ROS otherwise negative. ?  ?Objective:  ?BP (!) 90/54   Pulse 76   Wt 115 lb (52.2 kg)   Breastfeeding Yes   BMI 19.74 kg/m?  ?Gen: well appearing, NAD ?HEENT: no scleral icterus ?CV: RR ?Lung: Normal WOB ?Ext: warm well perfused ?Breasts: lactating, no erythema or tenderness, nipples normal.  ? ?Latch/feeding assessment: ?Infant not present at the  appointment. ? ?Assessment and Plan:  ?1. Lactation problem ?- Low milk supply likely due to excessive blood loss and low number of feedings at the breast/day. ?- Advised pt to begin feeding baby at the breast every time he cues and can give formula as needed. Need for formula should decrease as milk supply increases.  ?- Reassurance given that with a good latch and 8-12 sessions in a 24hr period, her milk supply will increase accordingly. ?- Reminded her that she may have a lower supply due to her delivery circumstances and the importance of attending to her own recovery (rest, hydration, nutrition) which will help her supply. ?- Copious encouragement given that any amount of breastmilk is beneficial to the baby, and that breastfeeding does not have to be an all or nothing relationship. Pt expressed understanding and appreciation for the validation. ? ?Can send questions via MyChart if needed or book another follow up visit with the baby to assess intake at the breast.  ? ?Bernerd Limbo, CNM, IBCLC ?Center for [facility] ? ? ?

## 2021-09-27 NOTE — Progress Notes (Signed)
? ? ?Post Partum Visit Note ? ?Lori Baldwin is a 26 y.o. [phone] female who presents for a postpartum visit. She is 4 weeks postpartum following a repeat cesarean section.  I have fully reviewed the prenatal and intrapartum course. The delivery was at [redacted]w[redacted]d gestational weeks via repeat Cesarean for fetal distress, followed by an nearly 6L hemorrhage and subsequent hysterectomy. Anesthesia: spinal. Postpartum course has been difficult given post-op pain and recovery. Baby is doing well. Baby is feeding by both breast and bottle - Gerber gentle . Bleeding no bleeding. Bowel function is normal. Bladder function is normal, but she has a burning sensation when she urinates. Patient is sexually active, but was unsuccessful with penetration due to abdominal/incisional pain. Contraception method is status post hysterectomy. Postpartum depression screening: negative. ? ?Health Maintenance Due  ?Topic Date Due  ? URINE MICROALBUMIN  Never done  ? HPV VACCINES (1 - 2-dose series) Never done  ? ? ?The following portions of the patient's history were reviewed and updated as appropriate: allergies, current medications, past family history, past medical history, past social history, past surgical history, and problem list. ? ?Review of Systems ?Pertinent items noted in HPI and remainder of comprehensive ROS otherwise negative. ? ?Objective:  ?BP (!) 90/54   Pulse 76   Wt 115 lb (52.2 kg)   Breastfeeding Yes   BMI 19.74 kg/m?   ? ?General:  alert, cooperative, appears stated age, and no distress  ? Breasts:  normal  ?Lungs: Normal effort  ?Heart:  regular rate and rhythm  ?Abdomen: Soft, non-tender except just above incision    ?Wound well approximated incision, dried blood noted below incision, no drainage of blood or fluid.  ?GU exam:  not indicated  ?     ?Assessment:  ? ? 1. Postpartum care and examination ?- Doing well overall, pain is well controlled on occasional ibuprofen. Stopped taking the oxycodone and gabapentin  on Friday, April 29 and has done well without them.  ? ?2. Status post hysterectomy ?- Feeling positive about the situation, asked about waist trainers. Advised she can wear a post-op belly support band but to avoid waist trainers because of the pressure it can put on the lower abdominal area. Pt understanding.  ? ?Plan:  ? ?Essential components of care per ACOG recommendations: ? ?1.  Mood and well being: Patient with negative depression screening today. Reviewed local resources for support.  ?- Patient tobacco use? No.   ?- hx of drug use? No.   ? ?2. Infant care and feeding:  ?-Patient currently breastmilk feeding? Yes. Reviewed importance of draining breast regularly to support lactation. See additional note for full consult. ?-Social determinants of health (SDOH) reviewed in EPIC. No concerns ? ?3. Sexuality, contraception and birth spacing ?- Patient had a hysterectomy. Advised to wait another 2-4wks, paying close attention to her comfort before attempting penetrative intercourse again. ? ?4. Sleep and fatigue ?-Encouraged family/partner/community support of 4 hrs of uninterrupted sleep to help with mood and fatigue ? ?5. Physical Recovery  ?- Discussed patients delivery and complications.  ?- Patient had a C-section emergent with subsequent hysterectomy ?- Patient is not safe to resume physical and sexual activity until she feels ready.  ? ?6.  Health Maintenance ?- HM due items addressed Yes ?- Last pap smear  ?Diagnosis  ?Date Value Ref Range Status  ?02/22/2021   Final  ? - Negative for intraepithelial lesion or malignancy (NILM)  ? Pap smear not done at today's visit.  ?-  Breast Cancer screening indicated? No.  ? ?7. Chronic Disease/Pregnancy Condition follow up: Gestational Diabetes ?- PCP follow up ? ?Bernerd Limbo, CNM ?Center for [employer], [facility]  ?

## 2021-09-28 LAB — GLUCOSE TOLERANCE, 2 HOURS
Glucose, 2 hour: 87 mg/dL (ref 70–139)
Glucose, GTT - Fasting: 83 mg/dL (ref 70–99)

## 2021-09-29 LAB — URINE CULTURE

## 2021-10-09 ENCOUNTER — Ambulatory Visit: Payer: Medicaid Other | Admitting: Family Medicine

## 2021-10-09 ENCOUNTER — Other Ambulatory Visit: Payer: Medicaid Other

## 2021-11-06 ENCOUNTER — Encounter (HOSPITAL_COMMUNITY): Payer: Self-pay | Admitting: Family Medicine

## 2021-11-06 NOTE — Addendum Note (Signed)
Addendum  created 11/06/21 1341 by Sonda Primes, CRNA   Intraprocedure Event edited

## 2023-10-07 IMAGING — DX DG CHEST 1V PORT
1 series · 1 of 1 positions shown · non-contrast
Comparison: None.

CLINICAL DATA: Post Caesarean delivery

EXAM:
PORTABLE CHEST 1 VIEW

[chest]
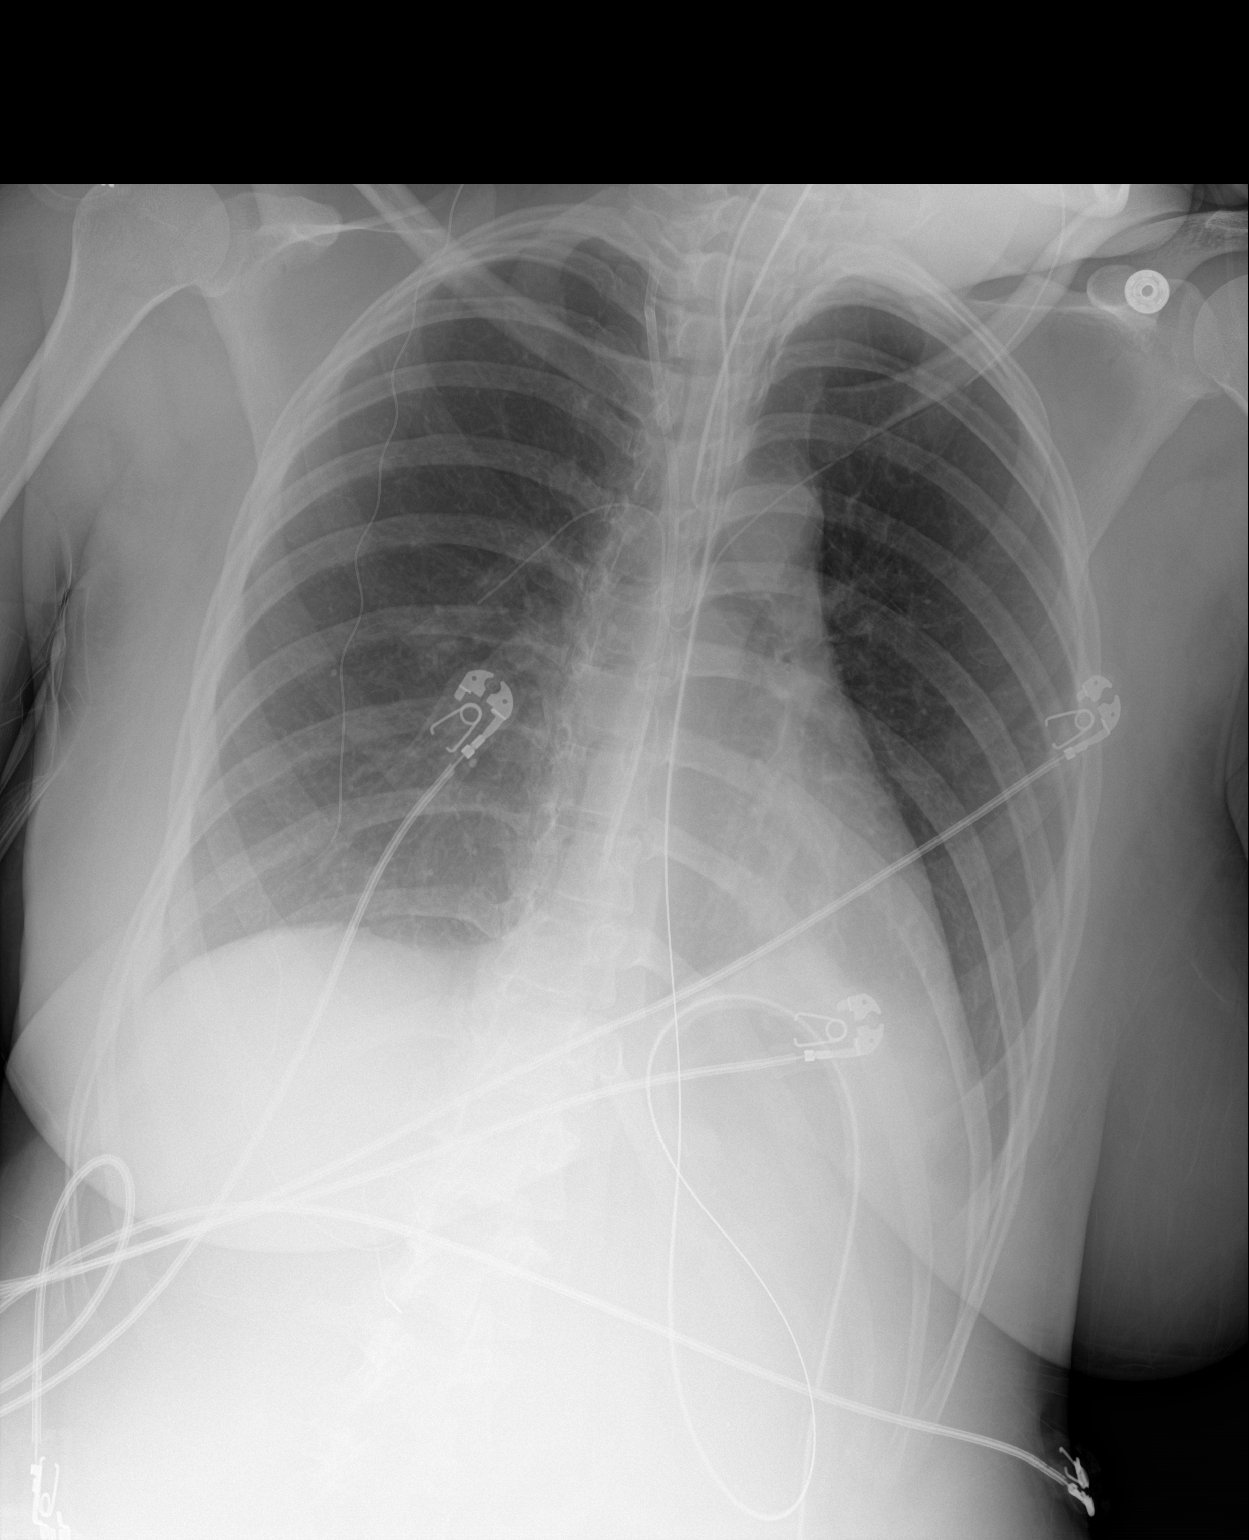

[1 of 1 positions shown; findings below may reference images not displayed]

FINDINGS: Endotracheal tube tip is 1.5 cm above the carina. Enteric tube
enters stomach and loops in the proximal stomach with the tip not
seen on this image. Right internal jugular central venous catheter
terminates over the right atrium. Normal cardiomediastinal
silhouette. No pneumothorax. Slight blunting of the costophrenic
angles bilaterally, cannot exclude trace pleural effusions. No
pulmonary edema. Mild hazy left retrocardiac opacity, favor
atelectasis.
IMPRESSION: 1. Endotracheal tube tip is 1.5 cm above the carina, consider
retracting 0.5 cm.
2. Right internal jugular central venous catheter terminates over
the right atrium. No pneumothorax.
3. Enteric tube enters the stomach, loops in the proximal stomach,
with tip not seen on this image.
4. Slight blunting of the costophrenic angles bilaterally, cannot
exclude trace pleural effusions.
5. Mild hazy left retrocardiac opacity, favor atelectasis.

## 2024-02-06 ENCOUNTER — Encounter (HOSPITAL_COMMUNITY): Payer: Self-pay

## 2024-02-06 ENCOUNTER — Ambulatory Visit (HOSPITAL_COMMUNITY): Admission: EM | Admit: 2024-02-06 | Discharge: 2024-02-06 | Disposition: A | Discharge status: Home / Self Care

## 2024-02-06 DIAGNOSIS — S61012A Laceration without foreign body of left thumb without damage to nail, initial encounter: Secondary | ICD-10-CM

## 2024-02-06 NOTE — ED Triage Notes (Signed)
 Pt present laceration on right hand/thumb, Incident happen two days ago pt state the area continues to bleed after removing bandage.
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
## 2024-02-26 ENCOUNTER — Ambulatory Visit (HOSPITAL_COMMUNITY)
Admission: EM | Admit: 2024-02-26 | Discharge: 2024-02-26 | Disposition: A | Discharge status: Home / Self Care | Attending: Physician Assistant | Admitting: Physician Assistant

## 2024-02-26 ENCOUNTER — Encounter (HOSPITAL_COMMUNITY): Payer: Self-pay | Admitting: Emergency Medicine

## 2024-02-26 DIAGNOSIS — L232 Allergic contact dermatitis due to cosmetics: Secondary | ICD-10-CM | POA: Diagnosis not present

## 2024-02-26 DIAGNOSIS — L03211 Cellulitis of face: Secondary | ICD-10-CM | POA: Diagnosis not present

## 2024-02-26 MED ORDER — PREDNISONE 20 MG PO TABS
20.0000 mg | ORAL_TABLET | Freq: Every day | ORAL | 0 refills | Status: AC
Start: 2024-02-26 — End: 2024-03-01

## 2024-02-26 MED ORDER — CEPHALEXIN 500 MG PO CAPS: 500.0000 mg | ORAL_CAPSULE | Freq: Four times a day (QID) | ORAL | 0 refills | Status: DC

## 2024-02-26 NOTE — ED Provider Notes (Signed)
 [facility]    CSN: [id number] Arrival date & time: 02/26/24  1732      History   Chief Complaint Chief Complaint  Patient presents with   Cellulitis of eyebrows     HPI Lori Baldwin is a 28 y.o. female.   Patient presents today with a weeklong history of painful rash with associated swelling and drainage of bilateral eyebrows.  She reports that she went to have her eyebrows tinted and laminated which is a procedure she never had before.  Almost immediately after this procedure she had itching and developed some swelling.  She was out of town when this occurred and so went to a different hospital system where they prescribed her cephalexin and prednisone .  She has not yet started these medications.  She has been taking Benadryl  and reports that the swelling has improved but she continues to have crusting and drainage as well as increasing pain.  Denies any fever, nausea, vomiting, spread of rash.  Denies any involvement of her eye including visual disturbance, ocular pain, photophobia.  She denies any recent antibiotics.  She denies any swelling of her throat, shortness of breath, muffled voice.  She is confident she is not pregnant as she had a partial hysterectomy several years ago.      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   Alpha thalassemia silent carrier 03/13/2021   Rubella non-immune status, antepartum 02/23/2021   Previous cesarean delivery affecting  pregnancy, antepartum 02/22/2021   Supervision of high-risk pregnancy 02/01/2021    Past Surgical History:  Procedure Laterality Date   ABDOMINAL HYSTERECTOMY  08/29/2021   Procedure: HYSTERECTOMY ABDOMINAL;  Surgeon: Fredirick Glenys RAMAN, MD;  Location: [facility];  Service: Obstetrics;;   CESAREAN SECTION     CESAREAN SECTION N/A 08/29/2021   Procedure: CESAREAN SECTION Exploratory Laparotomy;  Surgeon: Barbra Lang PARAS, DO;  Location: [facility];  Service: Obstetrics;  Laterality: N/A;   LAPAROTOMY  08/29/2021   Procedure: EXPLORATORY LAPAROTOMY;  Surgeon: Barbra Lang PARAS, DO;  Location: [facility];  Service: Obstetrics;;    OB History     Gravida  4   Para  2   Term  2   Preterm      AB  2   Living  2      SAB  1   IAB      Ectopic  1   Multiple  0   Live Births  2        Obstetric Comments  C-section due to FHR changes          Home Medications    Prior to Admission medications   Medication Sig Start Date End Date Taking? Authorizing Provider  cephALEXin (KEFLEX) 500 MG capsule Take 1 capsule (500 mg total) by mouth 4 (four) times daily. 02/26/24  Yes Manuelita Moxon K, PA-C  predniSONE  (DELTASONE ) 20 MG tablet Take 1 tablet (  20 mg total) by mouth daily for 4 days. 02/26/24 03/01/24 Yes Jamecia Lerman, Rocky POUR, PA-C    Family History Family History  Problem Relation Age of Onset   Hypertension Mother    Cancer Maternal Grandmother        breast   Diabetes Maternal Grandfather    Sickle cell anemia Paternal Grandmother     Social History Social History   Tobacco Use   Smoking status: Never   Smokeless tobacco: Never  Vaping Use   Vaping status: Never Used  Substance Use Topics   Alcohol use: No   Drug use: Not Currently    Comment: last was early Dec     Allergies   Aspirin   Review of Systems Review of Systems  Constitutional:  Positive for activity change. Negative for appetite change, fatigue and fever.  Eyes:  Negative for photophobia, discharge,  redness and visual disturbance.  Gastrointestinal:  Negative for diarrhea, nausea and vomiting.  Skin:  Positive for rash.  Neurological:  Negative for dizziness and headaches.     Physical Exam Triage Vital Signs ED Triage Vitals  Encounter Vitals Group     BP 02/26/24 1903 110/79     Girls Systolic BP Percentile --      Girls Diastolic BP Percentile --      Boys Systolic BP Percentile --      Boys Diastolic BP Percentile --      Pulse Rate 02/26/24 1903 88     Resp 02/26/24 1903 16     Temp 02/26/24 1903 98.7 F (37.1 C)     Temp src --      SpO2 02/26/24 1903 98 %     Weight --      Height --      Head Circumference --      Peak Flow --      Pain Score 02/26/24 1904 0     Pain Loc --      Pain Education --      Exclude from Growth Chart --    No data found.  Updated Vital Signs BP 110/79 (BP Location: Left Arm)   Pulse 88   Temp 98.7 F (37.1 C)   Resp 16   LMP 11/16/2020 (Within Days)   SpO2 98%   Visual Acuity Right Eye Distance:   Left Eye Distance:   Bilateral Distance:    Right Eye Near:   Left Eye Near:    Bilateral Near:     Physical Exam Vitals reviewed.  Constitutional:      General: She is awake. She is not in acute distress.    Appearance: Normal appearance. She is well-developed. She is not ill-appearing.     Comments: Very pleasant female appears stated age in no acute distress sitting comfortably in exam room  HENT:     Head: Normocephalic and atraumatic.     Mouth/Throat:     Pharynx: Uvula midline. No oropharyngeal exudate or posterior oropharyngeal erythema.  Eyes:     Extraocular Movements: Extraocular movements intact.     Conjunctiva/sclera: Conjunctivae normal.     Pupils: Pupils are equal, round, and reactive to light.  Cardiovascular:     Rate and Rhythm: Normal rate and regular rhythm.     Heart sounds: Normal heart sounds, S1 normal and S2 normal. No murmur heard. Pulmonary:     Effort: Pulmonary effort is normal.      Breath sounds: Normal breath sounds. No wheezing, rhonchi or rales.  Comments: Clear to auscultation bilaterally Skin:    Comments: Erythema and edema with associated scaling and several fine pustules noted bilateral eyebrows.  Psychiatric:        Behavior: Behavior is cooperative.      UC Treatments / Results  Labs (all labs ordered are listed, but only abnormal results are displayed) Labs Reviewed - No data to display  EKG   Radiology No results found.  Procedures Procedures (including critical care time)  Medications Ordered in UC Medications - No data to display  Initial Impression / Assessment and Plan / UC Course  I have reviewed the triage vital signs and the nursing notes.  Pertinent labs & imaging results that were available during my care of the patient were reviewed by me and considered in my medical decision making (see chart for details).     Patient is well-appearing, afebrile, nontoxic, nontachycardic.  The swelling and other symptoms have improved since onset but she continues to have crusting and fine pustules.  I am concerned for a contact dermatitis from the chemicals used for the cosmetic procedure that have led to a secondary infection.  She was started on prednisone  20 mg daily for 4 days with instruction not to take NSAIDs with this medication due to risk of GI bleeding.  She is to keep the area clean and use hypoallergenic soaps and detergents.  We discussed that she should avoid these chemical/procedure in the future.  Will start cephalexin 4 times daily for 5 days.  No indication for dose adjustment based on metabolic panel from 08/30/2021 with a creatinine of 0.61 and calculated creatinine clearance of 112 mL/min.  We discussed that if she has any worsening or changing symptoms including spread of rash, additional lesions, fever, nausea, vomiting, visual disturbance or ocular involvement she needs to be seen emergently.  Strict return precautions given.   Excuse note provided.  Final Clinical Impressions(s) / UC Diagnoses   Final diagnoses:  Allergic contact dermatitis due to cosmetics  Cellulitis of face     Discharge Instructions      We are treating you for an allergic reaction and infection.  Take cephalexin 4 times daily for 5 days.  Take prednisone  20 mg for 4 days.  Do not take NSAIDs with this medication including aspirin, ibuprofen /Advil , naproxen /Aleve .  Keep the area clean with soap and water .  Avoid contact with those chemicals/cosmetics again.  If your symptoms are not improving within 3 to 5 days or if anything worsens and you have spread of rash, swelling of your eye, visual disturbance, fever, nausea, vomiting you need to be seen immediately.    ED Prescriptions     Medication Sig Dispense Auth. Provider   cephALEXin (KEFLEX) 500 MG capsule Take 1 capsule (500 mg total) by mouth 4 (four) times daily. 20 capsule Ilyse Tremain K, PA-C   predniSONE  (DELTASONE ) 20 MG tablet Take 1 tablet (20 mg total) by mouth daily for 4 days. 4 tablet Kiala Faraj K, PA-C      PDMP not reviewed this encounter.   Sherrell Rocky POUR, PA-C 02/26/24 2000

## 2024-02-26 NOTE — Discharge Instructions (Signed)
 We are treating you for an allergic reaction and infection.  Take cephalexin 4 times daily for 5 days.  Take prednisone  20 mg for 4 days.  Do not take NSAIDs with this medication including aspirin, ibuprofen /Advil , naproxen /Aleve .  Keep the area clean with soap and water .  Avoid contact with those chemicals/cosmetics again.  If your symptoms are not improving within 3 to 5 days or if anything worsens and you have spread of rash, swelling of your eye, visual disturbance, fever, nausea, vomiting you need to be seen immediately.

## 2024-02-26 NOTE — ED Triage Notes (Signed)
 Pt st's she was out of state in [place] and had her eyebrows tented.  Pt st's afterwards both eyebrows started draining with swelling to eyes.  Pt went to Hospital in [place] and was dx with cellulitis of the eyebrows and was given Rx for antibiotic but did not get it filled

## 2024-05-18 ENCOUNTER — Ambulatory Visit (HOSPITAL_COMMUNITY)

## 2024-05-18 ENCOUNTER — Ambulatory Visit (HOSPITAL_COMMUNITY)
Admission: EM | Admit: 2024-05-18 | Discharge: 2024-05-18 | Disposition: A | Discharge status: Home / Self Care | Attending: Physician Assistant | Admitting: Physician Assistant

## 2024-05-18 ENCOUNTER — Encounter (HOSPITAL_COMMUNITY): Payer: Self-pay | Admitting: *Deleted

## 2024-05-18 ENCOUNTER — Other Ambulatory Visit: Payer: Self-pay

## 2024-05-18 ENCOUNTER — Ambulatory Visit (HOSPITAL_COMMUNITY): Payer: Self-pay | Admitting: Physician Assistant

## 2024-05-18 DIAGNOSIS — S39012A Strain of muscle, fascia and tendon of lower back, initial encounter: Secondary | ICD-10-CM | POA: Diagnosis not present

## 2024-05-18 DIAGNOSIS — M545 Low back pain, unspecified: Secondary | ICD-10-CM | POA: Diagnosis not present

## 2024-05-18 MED ORDER — METHOCARBAMOL 500 MG PO TABS: 500.0000 mg | ORAL_TABLET | Freq: Two times a day (BID) | ORAL | 0 refills | Status: DC

## 2024-05-18 MED ORDER — KETOROLAC TROMETHAMINE 30 MG/ML IJ SOLN
INTRAMUSCULAR | Status: AC
  Filled 2024-05-18: qty 1

## 2024-05-18 MED ORDER — LIDOCAINE 5 % EX PTCH: 1.0000 | MEDICATED_PATCH | CUTANEOUS | 0 refills | Status: DC

## 2024-05-18 MED ORDER — KETOROLAC TROMETHAMINE 30 MG/ML IJ SOLN
30.0000 mg | Freq: Once | INTRAMUSCULAR | Status: AC
  Administered 2024-05-18: 30 mg via INTRAMUSCULAR

## 2024-05-18 MED ORDER — DEXAMETHASONE SOD PHOSPHATE PF 10 MG/ML IJ SOLN
10.0000 mg | Freq: Once | INTRAMUSCULAR | Status: AC
  Administered 2024-05-18: 10 mg via INTRAMUSCULAR

## 2024-05-18 NOTE — Discharge Instructions (Signed)
 I do not see anything out of place on your x-ray.  I will contact you if the radiologist sees something that I did not that changes our treatment plan.  We gave you an injection of Toradol  today so please do not take NSAIDs for the next 24 hours including aspirin, ibuprofen /Advil , naproxen /Aleve .  After the 24 hours you can restart these medications.  You can use Tylenol  until then.  Take methocarbamol  to twice a day.  This will make you sleepy so do not drive or drink alcohol while taking it.  Apply lidocaine  patch during the day and then remove this at night; use only 1 patch for 24 hours.  You may benefit from physical therapy and you may need imaging such as an MRI so please follow-up with sports medicine if your symptoms do not go away within a few days.  If anything worsens and you have difficulty moving your legs, weakness in your legs, going to the bathroom yourself without noticing it, numbness or tingling on the inside of your legs you need to be seen immediately.

## 2024-05-18 NOTE — ED Provider Notes (Signed)
 " [facility]    CSN: [id number] Arrival date & time: 05/18/24  1643      History   Chief Complaint Chief Complaint  Patient presents with   Back Pain    HPI Lori Baldwin is a 28 y.o. female.   Patient presents today with a 4-day history of worsening lower back pain.  She reports that currently pain is rated 8 on a 0-10 pain scale, described as intense aching, localized to her midline lower back with radiation across her lower back, described as aching with periodic spasm/ripping pain, worse with certain movements, no alleviating factors identified.  She has tried Tylenol  without improvement of symptoms.  Her last dose was earlier today and she has not had any medication in the past few hours.  She denies any previous injury or surgery involving her back.  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  She denies any bowel/bladder incontinence, lower extremity weakness, saddle anesthesia.  She is confident she is not pregnant as she is status post hysterectomy and is not breast-feeding.  She denies any personal history of diabetes or malignancy.  She is having difficulty with her daily activities as a result of symptoms.    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   Alpha thalassemia silent carrier 03/13/2021    Rubella non-immune status, antepartum 02/23/2021   Previous cesarean delivery affecting pregnancy, antepartum 02/22/2021   Supervision of high-risk pregnancy 02/01/2021    Past Surgical History:  Procedure Laterality Date   ABDOMINAL HYSTERECTOMY  08/29/2021   Procedure: HYSTERECTOMY ABDOMINAL;  Surgeon: Fredirick Glenys RAMAN, MD;  Location: [facility];  Service: Obstetrics;;   CESAREAN SECTION     CESAREAN SECTION N/A 08/29/2021   Procedure: CESAREAN SECTION Exploratory Laparotomy;  Surgeon: Barbra Lang PARAS, DO;  Location: [facility];  Service: Obstetrics;  Laterality: N/A;   LAPAROTOMY  08/29/2021   Procedure: EXPLORATORY LAPAROTOMY;  Surgeon: Barbra Lang PARAS, DO;  Location: [facility];  Service: Obstetrics;;    OB History     Gravida  4   Para  2   Term  2   Preterm      AB  2   Living  2      SAB  1   IAB      Ectopic  1   Multiple  0   Live Births  2        Obstetric Comments  C-section due to FHR changes          Home Medications    Prior to Admission medications  Medication Sig Start Date End Date Taking? Authorizing Provider  lidocaine  (LIDODERM ) 5 % Place 1 patch onto the skin daily. Remove & Discard patch within 12 hours or as  directed by MD 05/18/24  Yes Theseus Birnie, Rocky POUR, PA-C  methocarbamol  (ROBAXIN ) 500 MG tablet Take 1 tablet (500 mg total) by mouth 2 (two) times daily. 05/18/24  Yes Kaylan Yates, Rocky POUR, PA-C    Family History Family History  Problem Relation Age of Onset   Hypertension Mother    Cancer Maternal Grandmother        breast   Diabetes Maternal Grandfather    Sickle cell anemia Paternal Grandmother     Social History Social History[1]   Allergies   Aspirin   Review of Systems Review of Systems  Constitutional:  Positive for activity change. Negative for appetite change, fatigue and fever.  Gastrointestinal:  Negative for diarrhea, nausea and vomiting.  Genitourinary:  Negative for difficulty urinating and enuresis.   Musculoskeletal:  Positive for back pain. Negative for arthralgias and myalgias.  Neurological:  Negative for weakness and numbness.     Physical Exam Triage Vital Signs ED Triage Vitals  Encounter Vitals Group     BP 05/18/24 1839 (!) 93/59     Girls Systolic BP Percentile --      Girls Diastolic BP Percentile --      Boys Systolic BP Percentile --      Boys Diastolic BP Percentile --      Pulse Rate 05/18/24 1839 85     Resp 05/18/24 1839 20     Temp 05/18/24 1839 97.9 F (36.6 C)     Temp src --      SpO2 05/18/24 1839 98 %     Weight --      Height --      Head Circumference --      Peak Flow --      Pain Score 05/18/24 1837 10     Pain Loc --      Pain Education --      Exclude from Growth Chart --    No data found.  Updated Vital Signs BP (!) 93/59   Pulse 85   Temp 97.9 F (36.6 C)   Resp 20   LMP 11/16/2020   SpO2 98%   Visual Acuity Right Eye Distance:   Left Eye Distance:   Bilateral Distance:    Right Eye Near:   Left Eye Near:    Bilateral Near:     Physical Exam Vitals reviewed.  Constitutional:      General: She is awake. She is not in acute distress.    Appearance: Normal appearance. She is well-developed. She is not ill-appearing.     Comments: Very pleasant female appears stated age in no acute distress sitting comfortably in exam room  HENT:     Head: Normocephalic and atraumatic.  Cardiovascular:     Rate and Rhythm: Normal rate and regular rhythm.     Heart sounds: Normal heart sounds, S1 normal and S2 normal. No murmur heard. Pulmonary:     Effort: Pulmonary effort is normal.     Breath sounds: Normal breath sounds. No wheezing, rhonchi or rales.     Comments: Clear to auscultation bilaterally Musculoskeletal:     Cervical back: No tenderness or bony tenderness.     Thoracic back: No tenderness or bony tenderness.     Lumbar back: Tenderness and bony tenderness present.     Comments: Back: Pain percussion of lumbar vertebrae  at approximately L3-L5 without deformity or step-off.  Tender to palpation of lumbar paraspinal muscles.  No spasm noted.  Strength 4/5 bilateral lower extremities.  Patient unable to lay flat due to severity of pain and spasm so unable to perform special test.  Psychiatric:        Behavior: Behavior is cooperative.      UC Treatments / Results  Labs (all labs ordered are listed, but only abnormal results are displayed) Labs Reviewed - No data to display  EKG   Radiology No results found.  Procedures Procedures (including critical care time)  Medications Ordered in UC Medications  dexamethasone  (DECADRON ) injection 10 mg (10 mg Intramuscular Given 05/18/24 1923)  ketorolac  (TORADOL ) 30 MG/ML injection 30 mg (30 mg Intramuscular Given 05/18/24 1923)    Initial Impression / Assessment and Plan / UC Course  I have reviewed the triage vital signs and the nursing notes.  Pertinent labs & imaging results that were available during my care of the patient were reviewed by me and considered in my medical decision making (see chart for details).     Patient is well-appearing, afebrile, nontoxic, nontachycardic.  Plain films were obtained as she had bony tenderness on exam with significant pain that showed no acute osseous abnormality based on my primary read.  At the time of discharge we were waiting for radiologist over read and will contact her if this differs and changes our treatment plan.  She denies any alarm symptoms that warrant emergent evaluation or imaging.  Patient was given Toradol  30 mg and Decadron  10 mg in clinic and she did have improvement of symptoms; her pain improved from 8 at onset of visit to 5 at the time of discharge.  No indication for dose adjustment based on metabolic panel from 08/30/2021 with creatinine of 0.61 and creatinine clearance of 112 mL/min.  She was discharged to take NSAIDs for the next 24 hours but can use Tylenol  for breakthrough pain.  She was given  methocarbamol  to be taken up to twice a day and we discussed that this can be sedating and she is not to drive or drink alcohol while taking it.  She can apply lidocaine  patches for additional symptom relief.  Recommended close follow-up with sports medicine as she may benefit from physical therapy and/or advanced imaging that we do not have access to an urgent care.  She was given the contact information of a local provider with instruction to call to schedule an appointment.  She was provided a work excuse note but reports that she needs to go to work but is just hoping that she can have a different assignment.  I did provide her both an excuse note excusing her until 05/21/2024 but also second note allowing her to go back tomorrow (05/19/2024) with the request for light duty if this can be accommodated we discussed that we typically do not do work restrictions and would not be able to provide her any additional paperwork.  We discussed that if she has any worsening or changing symptoms she needs to be seen immediately.  Return precautions given.   Final Clinical Impressions(s) / UC Diagnoses   Final diagnoses:  Acute midline low back pain without sciatica  Strain of lumbar region, initial encounter     Discharge Instructions      I do not see anything out of place on your x-ray.  I will contact you if the radiologist sees something that I did not that changes our treatment plan.  We gave you an injection of Toradol  today so please do not take NSAIDs for the next 24 hours including aspirin, ibuprofen /Advil , naproxen /Aleve .  After the 24 hours you can restart these medications.  You can use Tylenol  until then.  Take methocarbamol  to twice a day.  This will make you sleepy so do not drive or drink alcohol while taking it.  Apply lidocaine  patch during the day and then remove this at night; use only 1 patch for 24 hours.  You may benefit from physical therapy and you may need imaging such as an MRI so  please follow-up with sports medicine if your symptoms do not go away within a few days.  If anything worsens and you have difficulty moving your legs, weakness in your legs, going to the bathroom yourself without noticing it, numbness or tingling on the inside of your legs you need to be seen immediately.     ED Prescriptions     Medication Sig Dispense Auth. Provider   methocarbamol  (ROBAXIN ) 500 MG tablet Take 1 tablet (500 mg total) by mouth 2 (two) times daily. 20 tablet Hadriel Northup K, PA-C   lidocaine  (LIDODERM ) 5 % Place 1 patch onto the skin daily. Remove & Discard patch within 12 hours or as directed by MD 10 patch Cassidee Deats K, PA-C      PDMP not reviewed this encounter.     [1]  Social History Tobacco Use   Smoking status: Never   Smokeless tobacco: Never  Vaping Use   Vaping status: Never Used  Substance Use Topics   Alcohol use: No   Drug use: Not Currently    Comment: last was early Dec     Eniyah Eastmond K, [place] 05/18/24 1959  "

## 2024-05-18 NOTE — ED Triage Notes (Signed)
 PT reports she works in [employer] with the primary Pt weighted 350LBS . Pt SAT and Sunday Back pain is worse.  Pt reports taking tylenol  with out relief. Pt reports Back pain when coughing or sneezing.

## 2024-06-12 NOTE — Progress Notes (Signed)
 "               Lori Baldwin D.[facility] [address] [place] [address] Phone: [phone]   Assessment and Plan:     1. 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  My interpretation: No acute fracture or vertebral collapse.  Unremarkable imaging - Start meloxicam  15 mg daily x2 weeks.  If still having pain after 2 weeks, complete 3rd-week of NSAID. May use remaining NSAID as needed once daily for pain control.  Do not to use additional over-the-counter NSAIDs (ibuprofen , naproxen , Advil , Aleve , etc.) while taking prescription NSAIDs.  May use Tylenol  [phone] mg 2 to 3 times a day for breakthrough pain. - Start HEP for low back - Recommend heating pads along low back - Work note provided for patient to not lift >15 pounds for 4 weeks or until reevaluated  15 additional minutes spent for educating Therapeutic Home Exercise Program.  This included exercises focusing on stretching, strengthening, with focus on eccentric aspects.   Long term goals include an improvement in range of motion, strength, endurance as well as avoiding reinjury. Patient's frequency would include in 1-2 times a day, 3-5 times a week for a duration of 6-12 weeks. Proper technique shown and discussed handout in great detail with ATC.  All questions were discussed and answered.      Pertinent previous records reviewed include lumbar x-ray 05/18/2024, urgent care note 05/18/2024, urgent care patient referral message 05/27/2024   Follow Up: 4 weeks for reevaluation.  Could consider physical therapy versus discussing OMT versus prednisone  course versus advanced imaging based on presentation   Subjective:   I, Lori Baldwin am a scribe for Dr. Mace.    Chief Complaint: back pain   HPI:   06/15/2024 Patient is a 29 year old female with back pain. Patient states that it has been since the 19th of December that she felt the pain. Now it feels like a contraction or cramps in her lower back. It is worse when she sits for long periods of time. It disturbs sleeping. She has to sleep on her side but if she does any kind of twisting she feels the pain. Urgent care prescribed muscle relaxer but they make her drowsy. Lower muscular strain was what she was diagnosed with and was given IM injections for inflammation and pain Dec 21st. No currently doing any ice, heat, or stretching. Using tylenol  only.    Relevant Historical Information: None pertinent  Additional pertinent review of systems negative.  Current Medications[1]   Objective:     Vitals:   06/15/24 1535  BP: 100/60  Pulse: 76  SpO2: 100%  Weight: 114 lb (51.7 kg)  Height: 5' 4 (1.626 m)      Body mass index is 19.57 kg/m.    Physical Exam:    Gen: Appears well, nad, nontoxic and pleasant Psych: Alert and oriented, appropriate mood and affect Neuro: sensation intact, strength is 5/5 in upper and lower extremities, muscle tone wnl Skin: no susupicious lesions or rashes  Back - Normal skin, Spine with normal alignment and no deformity.   No tenderness to vertebral process palpation.   Bilateral lumbar paraspinous muscles are tender and without spasm NTTP gluteal musculature Straight leg raise negative  Trendelenberg negative Piriformis Test negative Gait normal  Low back pain worsened by lumbar extension, flexion  Electronically signed by:  Lori Baldwin D.[facility] 4:17 PM 06/15/24     [1]  Current Outpatient Medications:    meloxicam  (MOBIC ) 15 MG tablet, Take 1 tablet daily for 2 weeks.  If still in pain after 2 weeks, take 1 tablet daily for an additional 1 week., Disp: 30 tablet, Rfl: 0   lidocaine  (LIDODERM ) 5 %, Place 1 patch onto  the skin daily. Remove & Discard patch within 12 hours or as directed by MD, Disp: 10 patch, Rfl: 0   methocarbamol  (ROBAXIN ) 500 MG tablet, Take 1 tablet (500 mg total) by mouth 2 (two) times daily., Disp: 20 tablet, Rfl: 0  "

## 2024-06-15 ENCOUNTER — Ambulatory Visit: Admitting: Sports Medicine

## 2024-06-15 VITALS — BP 100/60 | HR 76 | Ht 64.0 in | Wt 114.0 lb

## 2024-06-15 DIAGNOSIS — M545 Low back pain, unspecified: Secondary | ICD-10-CM

## 2024-06-15 MED ORDER — MELOXICAM 15 MG PO TABS: ORAL_TABLET | ORAL | 0 refills | Status: DC

## 2024-06-15 NOTE — Patient Instructions (Signed)
-   Start meloxicam  15 mg daily x2 weeks.  If still having pain after 2 weeks, complete 3rd-week of NSAID. May use remaining NSAID as needed once daily for pain control.  Do not to use additional over-the-counter NSAIDs (ibuprofen , naproxen , Advil , Aleve , etc.) while taking prescription NSAIDs.  May use Tylenol  [phone] mg 2 to 3 times a day for breakthrough pain.  Low back HEP   Heat to low back   Work note provided no lifting more 15 pounds  4 week follow up

## 2024-06-30 ENCOUNTER — Ambulatory Visit: Admitting: Internal Medicine

## 2024-07-01 ENCOUNTER — Ambulatory Visit (HOSPITAL_COMMUNITY)
Admission: RE | Admit: 2024-07-01 | Discharge: 2024-07-01 | Disposition: A | Discharge status: Home / Self Care | Source: Ambulatory Visit

## 2024-07-01 ENCOUNTER — Encounter (HOSPITAL_COMMUNITY): Payer: Self-pay

## 2024-07-01 VITALS — BP 105/66 | HR 79 | Temp 98.1°F | Resp 14

## 2024-07-01 DIAGNOSIS — N898 Other specified noninflammatory disorders of vagina: Secondary | ICD-10-CM

## 2024-07-01 NOTE — ED Notes (Signed)
Patient left prior to receiving her discharge instructions.

## 2024-07-01 NOTE — ED Triage Notes (Signed)
 Patient reports that she has had a white vaginal discharge with a musty odor x 2 weeks.  Patient states a history of frequent BV.  Patient has been taking an OTC yeast medicatin for her symptoms.

## 2024-07-01 NOTE — Discharge Instructions (Addendum)
 Your Vaginal Swab is pending. Your results will be in your MyChart. You will be notified by nurse for any positive results with next step in treatment recommendations for your care.

## 2024-07-01 NOTE — ED Provider Notes (Incomplete)
 " [facility]    CSN: [id number] Arrival date & time: 07/01/24  1704      History   Chief Complaint Chief Complaint  Patient presents with   Vaginal Discharge    Entered by patient    HPI Lori Baldwin is a 29 y.o. female.   HPI Seen today for evaluation of vaginal discharge and odor. Denies any pelvic pain or tenderness, amenorrhea irregular bleeding or prolonged heavy bleeding.  Denies dysuria.  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   Alpha thalassemia silent carrier 03/13/2021   Rubella non-immune status, antepartum 02/23/2021   Previous cesarean delivery affecting pregnancy, antepartum 02/22/2021   Supervision of high-risk pregnancy 02/01/2021    Past Surgical History:  Procedure Laterality Date   ABDOMINAL HYSTERECTOMY  08/29/2021   Procedure: HYSTERECTOMY ABDOMINAL;  Surgeon: Fredirick Glenys RAMAN, MD;  Location: [facility];  Service: Obstetrics;;   CESAREAN SECTION     CESAREAN SECTION N/A 08/29/2021   Procedure: CESAREAN SECTION Exploratory Laparotomy;  Surgeon: Barbra Lang PARAS, DO;  [facility]: [facility];  Service: Obstetrics;  Laterality: N/A;   LAPAROTOMY  08/29/2021   Procedure: EXPLORATORY LAPAROTOMY;  Surgeon: Barbra Lang PARAS, DO;  [facility]: [facility];  Service: Obstetrics;;    OB History     Gravida  4   Para  2   Term  2   Preterm      AB  2   Living  2      SAB  1   IAB      Ectopic  1   Multiple  0    Live Births  2        Obstetric Comments  C-section due to FHR changes          Home Medications    Prior to Admission medications  Not on File    Family History Family History  Problem Relation Age of Onset   Hypertension Mother    Cancer Maternal Grandmother        breast   Diabetes Maternal Grandfather    Sickle cell anemia Paternal Grandmother     Social History Social History[1]   Allergies   Aspirin   Review of Systems Review of Systems   Physical Exam Triage Vital Signs ED Triage Vitals  Encounter Vitals Group     BP 07/01/24 1727 105/66     Girls Systolic BP Percentile --      Girls Diastolic BP Percentile --      Boys Systolic BP Percentile --      Boys Diastolic BP Percentile --      Pulse Rate 07/01/24 1727 79     Resp 07/01/24 1727 14     Temp 07/01/24 1727 98.1 F (36.7 C)     Temp Source 07/01/24 1727 Oral     SpO2 07/01/24 1727 98 %  Weight --      Height --      Head Circumference --      Peak Flow --      Pain Score 07/01/24 1726 0     Pain Loc --      Pain Education --      Exclude from Growth Chart --    No data found.  Updated Vital Signs BP 105/66 (BP Location: Right Arm)   Pulse 79   Temp 98.1 F (36.7 C) (Oral)   Resp 14   LMP 11/16/2020   SpO2 98%   Visual Acuity Right Eye Distance:   Left Eye Distance:   Bilateral Distance:    Right Eye Near:   Left Eye Near:    Bilateral Near:     Physical Exam Constitutional:      General: She is not in acute distress.    Appearance: She is normal weight.  HENT:     Head: Normocephalic.  Cardiovascular:     Rate and Rhythm: Normal rate.  Pulmonary:     Effort: Pulmonary effort is normal.     Breath sounds: Normal breath sounds.  Musculoskeletal:        General: Normal range of motion.  Skin:    General: Skin is warm.     Capillary Refill: Capillary refill takes less than 2 seconds.  Neurological:     General: No focal deficit present.     Mental  Status: She is alert and oriented to person, place, and time.      UC Treatments / Results  Labs (all labs ordered are listed, but only abnormal results are displayed) Labs Reviewed  CERVICOVAGINAL ANCILLARY ONLY    EKG   Radiology No results found.  Procedures Procedures (including critical care time)  Medications Ordered in UC Medications - No data to display  Initial Impression / Assessment and Plan / UC Course  I have reviewed the triage vital signs and the nursing notes.  Pertinent labs & imaging results that were available during my care of the patient were reviewed by me and considered in my medical decision making (see chart for details).     Vaginal discharge Final Clinical Impressions(s) / UC Diagnoses   Final diagnoses:  Vaginal discharge  Vaginal odor  29 year old female in today with vaginal close     Discharge Instructions      Your Vaginal Swab is pending. Your results will be in your MyChart. You will be notified by nurse for any positive results with next step in treatment recommendations for your care.       ED Prescriptions   None    PDMP not reviewed this encounter.    [1]  Social History Tobacco Use   Smoking status: Never   Smokeless tobacco: Never  Vaping Use   Vaping status: Never Used  Substance Use Topics   Alcohol use: No   Drug use: Not Currently    Comment: last was early Dec   "

## 2024-07-02 ENCOUNTER — Ambulatory Visit (HOSPITAL_COMMUNITY): Payer: Self-pay

## 2024-07-02 LAB — CERVICOVAGINAL ANCILLARY ONLY
Bacterial Vaginitis (gardnerella): POSITIVE — AB
Candida Glabrata: NEGATIVE
Candida Vaginitis: NEGATIVE
Chlamydia: NEGATIVE
Comment: NEGATIVE
Comment: NEGATIVE
Comment: NEGATIVE
Comment: NEGATIVE
Comment: NEGATIVE
Comment: NORMAL
Neisseria Gonorrhea: NEGATIVE
Trichomonas: NEGATIVE

## 2024-07-02 MED ORDER — METRONIDAZOLE 500 MG PO TABS: 500.0000 mg | ORAL_TABLET | Freq: Two times a day (BID) | ORAL | 0 refills | Status: AC

## 2024-07-13 ENCOUNTER — Ambulatory Visit: Admitting: Sports Medicine
